# Patient Record
Sex: Female | Born: 1945
Health system: Southern US, Community
[De-identification: ages and names within clinical notes are randomized; demographics above are authoritative.]

## PROBLEM LIST (undated history)

## (undated) DIAGNOSIS — F419 Anxiety disorder, unspecified: Secondary | ICD-10-CM

## (undated) DIAGNOSIS — H9191 Unspecified hearing loss, right ear: Secondary | ICD-10-CM

## (undated) DIAGNOSIS — F32A Depression, unspecified: Secondary | ICD-10-CM

## (undated) DIAGNOSIS — I4891 Unspecified atrial fibrillation: Secondary | ICD-10-CM

## (undated) DIAGNOSIS — F329 Major depressive disorder, single episode, unspecified: Secondary | ICD-10-CM

## (undated) DIAGNOSIS — M8580 Other specified disorders of bone density and structure, unspecified site: Secondary | ICD-10-CM

## (undated) DIAGNOSIS — M1990 Unspecified osteoarthritis, unspecified site: Secondary | ICD-10-CM

## (undated) DIAGNOSIS — I10 Essential (primary) hypertension: Secondary | ICD-10-CM

## (undated) HISTORY — DX: Major depressive disorder, single episode, unspecified: F32.9

## (undated) HISTORY — PX: ABDOMINAL HYSTERECTOMY: SHX81

## (undated) HISTORY — DX: Essential (primary) hypertension: I10

## (undated) HISTORY — DX: Other specified disorders of bone density and structure, unspecified site: M85.80

## (undated) HISTORY — DX: Depression, unspecified: F32.A

## (undated) HISTORY — PX: APPENDECTOMY: SHX54

## (undated) HISTORY — DX: Unspecified atrial fibrillation: I48.91

## (undated) HISTORY — DX: Anxiety disorder, unspecified: F41.9

## (undated) HISTORY — DX: Unspecified osteoarthritis, unspecified site: M19.90

---

## 2004-10-12 ENCOUNTER — Ambulatory Visit: Payer: Self-pay | Admitting: Family Medicine

## 2004-10-13 ENCOUNTER — Ambulatory Visit: Payer: Self-pay | Admitting: Family Medicine

## 2005-11-14 ENCOUNTER — Ambulatory Visit: Payer: Self-pay | Admitting: Family Medicine

## 2006-01-09 ENCOUNTER — Ambulatory Visit: Payer: Self-pay | Admitting: Family Medicine

## 2006-12-19 ENCOUNTER — Ambulatory Visit: Payer: Self-pay | Admitting: Family Medicine

## 2006-12-19 LAB — CONVERTED CEMR LAB
ALT: 25 units/L (ref 0–40)
BUN: 14 mg/dL (ref 6–23)
Basophils Absolute: 0.1 10*3/uL (ref 0.0–0.1)
Basophils Relative: 1.1 % — ABNORMAL HIGH (ref 0.0–1.0)
Calcium: 10 mg/dL (ref 8.4–10.5)
Eosinophils Relative: 3.7 % (ref 0.0–5.0)
GFR calc Af Amer: 72 mL/min
GFR calc non Af Amer: 60 mL/min
HCT: 42.7 % (ref 36.0–46.0)
Hgb A1c MFr Bld: 5.3 % (ref 4.6–6.0)
Lymphocytes Relative: 39.2 % (ref 12.0–46.0)
MCV: 85.3 fL (ref 78.0–100.0)
Monocytes Absolute: 0.5 10*3/uL (ref 0.2–0.7)
Platelets: 194 10*3/uL (ref 150–400)
Total Bilirubin: 0.7 mg/dL (ref 0.3–1.2)
Total CHOL/HDL Ratio: 3.9
Total Protein: 6.9 g/dL (ref 6.0–8.3)

## 2006-12-25 ENCOUNTER — Ambulatory Visit: Payer: Self-pay | Admitting: Family Medicine

## 2007-10-15 ENCOUNTER — Telehealth: Payer: Self-pay | Admitting: Family Medicine

## 2007-10-28 ENCOUNTER — Emergency Department (HOSPITAL_COMMUNITY): Admission: EM | Admit: 2007-10-28 | Discharge: 2007-10-29 | Payer: Self-pay | Admitting: Emergency Medicine

## 2007-11-06 ENCOUNTER — Ambulatory Visit: Payer: Self-pay | Admitting: Family Medicine

## 2007-11-06 DIAGNOSIS — M5430 Sciatica, unspecified side: Secondary | ICD-10-CM

## 2007-11-06 DIAGNOSIS — F341 Dysthymic disorder: Secondary | ICD-10-CM | POA: Insufficient documentation

## 2007-11-14 ENCOUNTER — Ambulatory Visit: Payer: Self-pay | Admitting: Family Medicine

## 2007-11-14 DIAGNOSIS — I10 Essential (primary) hypertension: Secondary | ICD-10-CM | POA: Insufficient documentation

## 2007-11-22 ENCOUNTER — Telehealth: Payer: Self-pay | Admitting: Family Medicine

## 2008-03-18 ENCOUNTER — Telehealth: Payer: Self-pay | Admitting: Family Medicine

## 2008-11-03 ENCOUNTER — Telehealth: Payer: Self-pay | Admitting: Family Medicine

## 2008-11-04 ENCOUNTER — Ambulatory Visit: Payer: Self-pay | Admitting: Family Medicine

## 2008-11-04 DIAGNOSIS — Z87891 Personal history of nicotine dependence: Secondary | ICD-10-CM

## 2008-11-04 DIAGNOSIS — M719 Bursopathy, unspecified: Secondary | ICD-10-CM

## 2008-11-04 DIAGNOSIS — M67919 Unspecified disorder of synovium and tendon, unspecified shoulder: Secondary | ICD-10-CM | POA: Insufficient documentation

## 2009-01-05 ENCOUNTER — Ambulatory Visit: Payer: Self-pay | Admitting: Family Medicine

## 2009-01-05 ENCOUNTER — Telehealth: Payer: Self-pay | Admitting: Family Medicine

## 2009-01-05 LAB — CONVERTED CEMR LAB
Bilirubin Urine: NEGATIVE
Glucose, Urine, Semiquant: NEGATIVE
Protein, U semiquant: NEGATIVE
Urobilinogen, UA: 0.2
pH: 6.5

## 2009-01-28 ENCOUNTER — Ambulatory Visit: Payer: Self-pay | Admitting: Family Medicine

## 2009-01-28 LAB — CONVERTED CEMR LAB
Bilirubin Urine: NEGATIVE
Blood in Urine, dipstick: NEGATIVE
Glucose, Urine, Semiquant: NEGATIVE
Ketones, urine, test strip: NEGATIVE
Protein, U semiquant: NEGATIVE
Urobilinogen, UA: 0.2
pH: 6.5

## 2009-02-02 ENCOUNTER — Telehealth: Payer: Self-pay | Admitting: Family Medicine

## 2009-02-18 ENCOUNTER — Ambulatory Visit: Payer: Self-pay | Admitting: Family Medicine

## 2009-02-18 DIAGNOSIS — N390 Urinary tract infection, site not specified: Secondary | ICD-10-CM

## 2009-02-18 DIAGNOSIS — R609 Edema, unspecified: Secondary | ICD-10-CM

## 2009-02-18 LAB — CONVERTED CEMR LAB: Protein, U semiquant: NEGATIVE

## 2009-02-19 ENCOUNTER — Encounter: Payer: Self-pay | Admitting: Family Medicine

## 2009-06-08 ENCOUNTER — Telehealth (INDEPENDENT_AMBULATORY_CARE_PROVIDER_SITE_OTHER): Payer: Self-pay | Admitting: *Deleted

## 2009-06-29 ENCOUNTER — Telehealth: Payer: Self-pay | Admitting: Family Medicine

## 2009-08-31 ENCOUNTER — Telehealth: Payer: Self-pay | Admitting: Family Medicine

## 2009-09-21 ENCOUNTER — Ambulatory Visit: Payer: Self-pay | Admitting: Family Medicine

## 2009-09-21 DIAGNOSIS — J209 Acute bronchitis, unspecified: Secondary | ICD-10-CM | POA: Insufficient documentation

## 2009-09-21 DIAGNOSIS — M129 Arthropathy, unspecified: Secondary | ICD-10-CM

## 2009-10-07 ENCOUNTER — Ambulatory Visit: Payer: Self-pay | Admitting: Family Medicine

## 2009-10-07 DIAGNOSIS — IMO0002 Reserved for concepts with insufficient information to code with codable children: Secondary | ICD-10-CM

## 2009-10-07 DIAGNOSIS — M1710 Unilateral primary osteoarthritis, unspecified knee: Secondary | ICD-10-CM | POA: Insufficient documentation

## 2009-11-02 ENCOUNTER — Ambulatory Visit (HOSPITAL_BASED_OUTPATIENT_CLINIC_OR_DEPARTMENT_OTHER): Admission: RE | Admit: 2009-11-02 | Discharge: 2009-11-02 | Payer: Self-pay | Admitting: Orthopaedic Surgery

## 2009-11-10 ENCOUNTER — Telehealth: Payer: Self-pay | Admitting: Family Medicine

## 2009-11-25 ENCOUNTER — Ambulatory Visit: Payer: Self-pay | Admitting: Family Medicine

## 2009-11-25 DIAGNOSIS — I10 Essential (primary) hypertension: Secondary | ICD-10-CM

## 2009-11-25 LAB — CONVERTED CEMR LAB: Nitrite: NEGATIVE

## 2011-01-05 ENCOUNTER — Other Ambulatory Visit: Payer: Self-pay | Admitting: Family Medicine

## 2011-01-26 ENCOUNTER — Encounter: Payer: Self-pay | Admitting: Family Medicine

## 2011-01-26 ENCOUNTER — Ambulatory Visit (INDEPENDENT_AMBULATORY_CARE_PROVIDER_SITE_OTHER): Payer: Medicare Other | Admitting: Family Medicine

## 2011-01-26 DIAGNOSIS — I10 Essential (primary) hypertension: Secondary | ICD-10-CM

## 2011-01-26 DIAGNOSIS — R300 Dysuria: Secondary | ICD-10-CM

## 2011-01-26 DIAGNOSIS — F329 Major depressive disorder, single episode, unspecified: Secondary | ICD-10-CM

## 2011-01-26 DIAGNOSIS — N959 Unspecified menopausal and perimenopausal disorder: Secondary | ICD-10-CM

## 2011-01-26 DIAGNOSIS — E039 Hypothyroidism, unspecified: Secondary | ICD-10-CM

## 2011-01-26 LAB — POCT URINALYSIS DIPSTICK
Bilirubin, UA: NEGATIVE
Glucose, UA: NEGATIVE
Ketones, UA: NEGATIVE
Leukocytes, UA: NEGATIVE
Nitrite, UA: NEGATIVE
Spec Grav, UA: 1.01
Urobilinogen, UA: 0.2

## 2011-01-26 LAB — TSH: TSH: 1.04 u[IU]/mL (ref 0.35–5.50)

## 2011-01-26 MED ORDER — ALPRAZOLAM 0.5 MG PO TABS: 0.5000 mg | ORAL_TABLET | Freq: Three times a day (TID) | ORAL | Status: DC | PRN

## 2011-01-26 MED ORDER — PAROXETINE HCL 20 MG PO TABS: 20.0000 mg | ORAL_TABLET | Freq: Two times a day (BID) | ORAL | Status: DC

## 2011-01-26 MED ORDER — TRIAMTERENE-HCTZ 75-50 MG PO TABS: 1.0000 | ORAL_TABLET | Freq: Every day | ORAL | Status: DC

## 2011-01-26 NOTE — Patient Instructions (Signed)
RETURN IN 2 MONTHS FOR WELLNESS EXAM REFILLED MEDICATIONS CONTINUE REGULR MEDICATIONS

## 2011-02-06 ENCOUNTER — Encounter: Payer: Self-pay | Admitting: Family Medicine

## 2011-02-06 NOTE — Progress Notes (Signed)
  Subjective:    Patient ID: Kristina Dougherty, female    DOB: 1946-03-01, 65 y.o.   MRN: [id number] This 65 year old white divorced female is in to refill her medications and discuss her medical problems appeared she had been her much upset with anxiety and depression secondary to her losing her job and not been able to find new employment he continues to bleed the Paxil but continues to be rather anxious and wonders about another medication for which I will treat her with alprazolam oh 0.0 mg 3 times a day as needed She relates she is become a vegetarian over the last one half year and physically states he thinks he feels better. She complained of some dysuria and we will do a urinalysis She states that she did not have any menopausal symptoms following her cessation of menstruation but over the past few months and had some sweat and additional anxiety for which we will do an FSH to evaluate with menopausal syndrome. HPI    Review of Systems  Constitutional: Negative.  Negative for activity change.  Eyes: Negative.   Respiratory: Negative.   Cardiovascular: Negative.   Gastrointestinal: Negative.   Genitourinary: Negative.   Musculoskeletal: Positive for arthralgias. Negative for myalgias, back pain, joint swelling and gait problem.  Skin: Negative.   Neurological: Positive for headaches.  Hematological: Negative.   Psychiatric/Behavioral: The patient is nervous/anxious.         Objective:   Physical Exam  Constitutional: She is oriented to person, place, and time. She appears well-developed and well-nourished. No distress.  HENT:  Head: Normocephalic and atraumatic.  Right Ear: External ear normal.  Left Ear: External ear normal.  Nose: Nose normal.  Eyes: Conjunctivae and EOM are normal. Pupils are equal, round, and reactive to light. Right eye exhibits no discharge. Left eye exhibits no discharge. No scleral icterus.  Neck: Normal range of motion. Neck supple. No JVD present. No  tracheal deviation present. No thyromegaly present.  Cardiovascular: Normal rate, regular rhythm, normal heart sounds and intact distal pulses.  Exam reveals no gallop and no friction rub.   No murmur heard. Pulmonary/Chest: Effort normal and breath sounds normal. No stridor. No respiratory distress. She has no wheezes. She has no rales. She exhibits no tenderness.  Abdominal: Soft. Bowel sounds are normal. She exhibits no distension and no mass. There is no tenderness. There is no rebound and no guarding.  Musculoskeletal: Normal range of motion. She exhibits no edema and no tenderness.  Lymphadenopathy:    She has cervical adenopathy.  Neurological: She is alert and oriented to person, place, and time. She displays normal reflexes. No cranial nerve deficit. She exhibits normal muscle tone. Coordination normal.  Skin: Skin is warm and dry. She is not diaphoretic.          Assessment & Plan:   discussed her medical problems and for job and pulmonary anxiety and depression and feel that she would benefit by taking  alprazolam  0.5 mg milligrams 3 times a day,as as well as continue Paxil Continue Maxide for your blood pressure, no treatment for postmenopausal syndrome at this time but will get a TSH and FSH as well as a urinalysis

## 2011-02-28 LAB — BASIC METABOLIC PANEL
BUN: 10 mg/dL (ref 6–23)
CO2: 26 mEq/L (ref 19–32)
Calcium: 9.4 mg/dL (ref 8.4–10.5)
Chloride: 99 mEq/L (ref 96–112)
Creatinine, Ser: 0.82 mg/dL (ref 0.4–1.2)
GFR calc Af Amer: >60 mL/min (ref >60)
Potassium: 4.5 mEq/L (ref 3.5–5.1)
Sodium: 134 mEq/L — ABNORMAL LOW (ref 135–145)

## 2011-02-28 LAB — POCT HEMOGLOBIN-HEMACUE: Hemoglobin: 16.3 g/dL — ABNORMAL HIGH (ref 12.0–15.0)

## 2011-09-04 LAB — COMPREHENSIVE METABOLIC PANEL
Albumin: 3.9
CO2: 24
Calcium: 8.9
Total Protein: 6.2

## 2011-09-04 LAB — POCT CARDIAC MARKERS
CKMB, poc: 1.2
Troponin i, poc: <0.05

## 2011-09-04 LAB — URINALYSIS, ROUTINE W REFLEX MICROSCOPIC
Glucose, UA: NEGATIVE
Hgb urine dipstick: NEGATIVE
Ketones, ur: NEGATIVE
Nitrite: NEGATIVE
Protein, ur: NEGATIVE
Specific Gravity, Urine: 1.017
Urobilinogen, UA: 0.2
pH: 6.5

## 2011-09-04 LAB — I-STAT 8, (EC8 V) (CONVERTED LAB)
BUN: 12
Glucose, Bld: 93
HCT: 49 — ABNORMAL HIGH
Hemoglobin: 16.7 — ABNORMAL HIGH
Operator id: 257131
Potassium: 3.3 — ABNORMAL LOW
TCO2: 26
pCO2, Ven: 42.2 — ABNORMAL LOW

## 2011-09-04 LAB — RAPID URINE DRUG SCREEN, HOSP PERFORMED
Amphetamines: NOT DETECTED
Barbiturates: POSITIVE — AB
Benzodiazepines: POSITIVE — AB
Cocaine: NOT DETECTED

## 2011-09-04 LAB — ETHANOL: Alcohol, Ethyl (B): 112 — ABNORMAL HIGH

## 2011-09-04 LAB — CBC
Hemoglobin: 15.4 — ABNORMAL HIGH
MCV: 83.2
Platelets: 200
WBC: 10.4

## 2011-09-04 LAB — DIFFERENTIAL
Basophils Relative: 1
Eosinophils Absolute: 0.2
Eosinophils Relative: 2
Neutrophils Relative %: 64

## 2011-09-04 LAB — POCT I-STAT CREATININE: Operator id: 257131

## 2011-09-13 ENCOUNTER — Other Ambulatory Visit: Payer: Self-pay | Admitting: Family Medicine

## 2011-12-19 ENCOUNTER — Telehealth: Payer: Self-pay | Admitting: Family Medicine

## 2011-12-19 NOTE — Telephone Encounter (Signed)
Pls advise.  

## 2011-12-19 NOTE — Telephone Encounter (Signed)
Refill for one month 

## 2011-12-19 NOTE — Telephone Encounter (Signed)
Need a 30 day supply of Paxil and Alprazolam sent [facility]. She is aware to establish with another dr. Lynford Dougherty.

## 2011-12-20 MED ORDER — ALPRAZOLAM 0.5 MG PO TABS: 0.5000 mg | ORAL_TABLET | Freq: Three times a day (TID) | ORAL | Status: DC | PRN

## 2011-12-20 MED ORDER — PAROXETINE HCL 20 MG PO TABS: 20.0000 mg | ORAL_TABLET | Freq: Two times a day (BID) | ORAL | Status: DC

## 2011-12-20 NOTE — Telephone Encounter (Signed)
Rx called in to pharmacy. 

## 2012-03-01 ENCOUNTER — Encounter (HOSPITAL_COMMUNITY): Payer: Self-pay | Admitting: Family Medicine

## 2012-03-01 ENCOUNTER — Emergency Department (HOSPITAL_COMMUNITY)
Admission: EM | Admit: 2012-03-01 | Discharge: 2012-03-02 | Disposition: A | Discharge status: Home / Self Care | Payer: Medicare Other | Attending: Emergency Medicine | Admitting: Emergency Medicine

## 2012-03-01 DIAGNOSIS — F329 Major depressive disorder, single episode, unspecified: Secondary | ICD-10-CM | POA: Insufficient documentation

## 2012-03-01 DIAGNOSIS — N3289 Other specified disorders of bladder: Secondary | ICD-10-CM | POA: Insufficient documentation

## 2012-03-01 DIAGNOSIS — F3289 Other specified depressive episodes: Secondary | ICD-10-CM | POA: Insufficient documentation

## 2012-03-01 DIAGNOSIS — F411 Generalized anxiety disorder: Secondary | ICD-10-CM | POA: Insufficient documentation

## 2012-03-01 DIAGNOSIS — R300 Dysuria: Secondary | ICD-10-CM | POA: Insufficient documentation

## 2012-03-01 DIAGNOSIS — M129 Arthropathy, unspecified: Secondary | ICD-10-CM | POA: Insufficient documentation

## 2012-03-01 DIAGNOSIS — I10 Essential (primary) hypertension: Secondary | ICD-10-CM | POA: Insufficient documentation

## 2012-03-01 DIAGNOSIS — N390 Urinary tract infection, site not specified: Secondary | ICD-10-CM | POA: Insufficient documentation

## 2012-03-01 LAB — URINALYSIS, ROUTINE W REFLEX MICROSCOPIC
Glucose, UA: NEGATIVE mg/dL
Nitrite: NEGATIVE
Protein, ur: NEGATIVE mg/dL
Specific Gravity, Urine: 1.007 (ref 1.005–1.030)
Urobilinogen, UA: 0.2 mg/dL (ref 0.0–1.0)

## 2012-03-01 NOTE — ED Notes (Signed)
Pt c/o "bladder spasms" as well as burning with urination. Pt states the urinary burning has been going on for a few weeks and she was previously tx with abx but the relief was only temporary. Pt states the bladder spasms started about a week ago. Pt was seen by urologist this week and her urine specimen came back negative. Pt states the burning with urination is not as bad as before but the bladder spasms are constant.

## 2012-03-01 NOTE — ED Notes (Signed)
Pt reports having bladder problems starting in February. States she has been given abx multiple times for uti's, but all urine cultures come back negative. States she has been having burning and pressure with urination and bladder spasms. NAD noted at this time.

## 2012-03-02 MED ORDER — CEFPODOXIME PROXETIL 200 MG PO TABS: 200.0000 mg | ORAL_TABLET | Freq: Two times a day (BID) | ORAL | Status: AC

## 2012-03-02 MED ORDER — HYDROCODONE-ACETAMINOPHEN 5-500 MG PO TABS: 1.0000 | ORAL_TABLET | Freq: Four times a day (QID) | ORAL | Status: DC | PRN

## 2012-03-02 NOTE — ED Provider Notes (Signed)
History     CSN: [id number]  Arrival date & time 03/01/12  1655   First MD Initiated Contact with Patient 03/01/12 2300      Chief Complaint  Patient presents with  . Bladder spasms     pressure and burning with urination    (Consider location/radiation/quality/duration/timing/severity/associated sxs/prior treatment) HPI Comments: Patient has seen urology and was last seen 3 days ago and at that time had a normal urine and urine culture. However patient's symptoms since that time have worsened with bladder spasm, dysuria and ongoing pain despite taking bladder spasm medication.  Patient is a 66 y.o. female presenting with dysuria. The history is provided by the patient.  Dysuria  This is a recurrent problem. Episode onset: 3 weeks ago. The problem occurs every urination. The problem has been gradually worsening. The quality of the pain is described as burning, stabbing, shooting and aching (pressure in the bladder). The pain is at a severity of 8/10. The pain is severe. There has been no fever. She is not sexually active. Associated symptoms include frequency and urgency. Pertinent negatives include no nausea, no vomiting, no discharge, no hematuria, no possible pregnancy and no flank pain. She has tried antibiotics ( has been on 3 different antibiotics the last 3 weeks. States she has taken Cipro, Macrobid, one other medication that she finished 3 days ago. She states that her symptoms improved for several days when she starts the antibiotic and then they return.) for the symptoms. Her past medical history is significant for recurrent UTIs. Her past medical history does not include kidney stones.    Past Medical History  Diagnosis Date  . Anxiety   . Depression   . Hypertension   . Arthritis     Past Surgical History  Procedure Date  . Abdominal hysterectomy     History reviewed. No pertinent family history.  History  Substance Use Topics  . Smoking status: Former Smoker --  0.3 packs/day    Types: Cigarettes    Quit date: 08/28/2011  . Smokeless tobacco: Never Used  . Alcohol Use: No    OB History    Grav Para Term Preterm Abortions TAB SAB Ect Mult Living                  Review of Systems  Gastrointestinal: Negative for nausea and vomiting.  Genitourinary: Positive for dysuria, urgency and frequency. Negative for hematuria and flank pain.  All other systems reviewed and are negative.    Allergies  Nitrofurantoin  Home Medications   Current Outpatient Rx  Name Route Sig Dispense Refill  . ALPRAZOLAM 0.5 MG PO TABS Oral Take 1 tablet (0.5 mg total) by mouth 3 (three) times daily as needed. 90 tablet 0  . ASPIRIN 81 MG PO TABS Oral Take 81 mg by mouth daily.      . ADULT MULTIVITAMIN W/MINERALS CH Oral Take 1 tablet by mouth daily.    Marland Kitchen PAROXETINE HCL 20 MG PO TABS Oral Take 1 tablet (20 mg total) by mouth 2 (two) times daily. 60 tablet 0    Pt needs to schedule a follow up appt before next  ...  . TRIAMTERENE-HCTZ 75-50 MG PO TABS Oral Take 1 tablet by mouth daily. 30 tablet 11    BP 134/98  Pulse 99  Temp(Src) 99.6 F (37.6 C) (Oral)  Resp 18  SpO2 93%  Physical Exam  Nursing note and vitals reviewed. Constitutional: She is oriented to person, place, and time. She  appears well-developed and well-nourished. No distress.  HENT:  Head: Normocephalic and atraumatic.  Eyes: EOM are normal. Pupils are equal, round, and reactive to light.  Cardiovascular: Normal rate, regular rhythm, normal heart sounds and intact distal pulses.  Exam reveals no friction rub.   No murmur heard. Pulmonary/Chest: Effort normal and breath sounds normal. She has no wheezes. She has no rales.  Abdominal: Soft. Bowel sounds are normal. She exhibits no distension. There is tenderness in the suprapubic area. There is no rebound, no guarding and no CVA tenderness.       Mild suprapubic tenderness  Genitourinary:       No evidence of yeast. Did not visualize any  bladder prolapse.  Musculoskeletal: Normal range of motion. She exhibits no tenderness.       No edema  Neurological: She is alert and oriented to person, place, and time. No cranial nerve deficit.  Skin: Skin is warm and dry. No rash noted.  Psychiatric: She has a normal mood and affect. Her behavior is normal.    ED Course  Procedures (including critical care time)  Labs Reviewed  URINALYSIS, ROUTINE W REFLEX MICROSCOPIC - Abnormal; Notable for the following:    Leukocytes, UA SMALL (*)    All other components within normal limits  URINE MICROSCOPIC-ADD ON - Abnormal; Notable for the following:    Bacteria, UA FEW (*)    All other components within normal limits  URINE CULTURE   No results found.   1. UTI (lower urinary tract infection)       MDM  Patient with a history of recurrent UTIs her last episode was 6 months ago where she states she required multiple rounds of antibiotics to alleviate symptoms. The last 3 weeks she's had worsening bladder spasm and dysuria. She's completed 3 courses of antibiotics which every time she starts the antibiotic her symptoms improved and then returned. She's seen Dr. Wynelle Link with urology and her last UA and urine culture was done 4 days ago and showed no signs of infection however since she's been off the antibiotics for the last 3 days her symptoms have only worsened. She denies any symptoms suggestive of pyelonephritis.  UA today shows 3-6 white blood cells few bacteria small leukocytes. Concern for new infection and urine culture sent. Patient started on Vantin due to the multiple recent antibiotics and will have followup with urology on Monday.       Gwyneth Sprout, MD 03/03/12 [phone]

## 2012-03-02 NOTE — Discharge Instructions (Signed)
Urinary Tract Infection A urinary tract infection (UTI) is often caused by a germ (bacteria). A UTI is usually helped with medicine (antibiotics) that kills germs. Take all the medicine until it is gone. Do this even if you are feeling better. You are usually better in 7 to 10 days. HOME CARE   Drink enough water and fluids to keep your pee (urine) clear or pale yellow. Drink:   Cranberry juice.   Water.   Avoid:   Caffeine.   Tea.   Bubbly (carbonated) drinks.   Alcohol.   Only take medicine as told by your doctor.   To prevent further infections:   Pee often.   After pooping (bowel movement), women should wipe from front to back. Use each tissue only once.   Pee before and after having sex (intercourse).  Ask your doctor when your test results will be ready. Make sure you follow up and get your test results.  GET HELP RIGHT AWAY IF:   There is very bad back pain or lower belly (abdominal) pain.   You get the chills.   You have a fever.   Your baby is older than 3 months with a rectal temperature of 102 F (38.9 C) or higher.   Your baby is 40 months old or younger with a rectal temperature of 100.4 F (38 C) or higher.   You feel sick to your stomach (nauseous) or throw up (vomit).   There is continued burning with peeing.   Your problems are not better in 3 days. Return sooner if you are getting worse.  MAKE SURE YOU:   Understand these instructions.   Will watch your condition.   Will get help right away if you are not doing well or get worse.  Document Released: 05/01/2008 Document Revised: 11/02/2011 Document Reviewed: 05/01/2008 [facility] Patient Information 2012 [place], [place].Urine Culture Collection, Female  You will collect a sample of pee (urine) in a cup. Read the instructions below before beginning. If you have any questions, ask the nurse before you begin. Follow the instructions carefully. 1. Wash your hands with soap and water and dry them  thoroughly.  2. Open the lid of the cup. Be careful not to touch the inside.  3. Clean the private (genital) area. 1. Sit over the toilet. Use the fingers of one hand to separate and hold open the folds of the skin in your private area.  2. Clean the pee (urinary) opening and surrounding area with the gauze, wiping from front to back. Throw away the gauze in the trash, not the toilet.  3. Repeat step "b"2 more times.  4. With the folds of skin still separated, pee a small amount into toilet. STOP, then pee into the cup. Fill the cup half way.  5. Put the lid on the cup tightly.  6. Wash your hands with soap and water.  7. If you were given a label, put the label on the cup.  8. Give the cup to the nurse.  Document Released: 10/26/2008 Document Revised: 11/02/2011 Document Reviewed: 10/26/2008 [facility] Patient Information 2012 [place], [place].

## 2012-03-03 LAB — URINE CULTURE
Colony Count: NO GROWTH
Culture: NO GROWTH
Special Requests: NORMAL

## 2012-03-18 ENCOUNTER — Ambulatory Visit: Payer: Medicare Other | Admitting: Family Medicine

## 2012-03-18 DIAGNOSIS — Z0289 Encounter for other administrative examinations: Secondary | ICD-10-CM

## 2012-03-19 ENCOUNTER — Encounter (HOSPITAL_COMMUNITY): Payer: Self-pay | Admitting: Emergency Medicine

## 2012-03-19 ENCOUNTER — Emergency Department (HOSPITAL_COMMUNITY)
Admission: EM | Admit: 2012-03-19 | Discharge: 2012-03-20 | Disposition: A | Discharge status: Home / Self Care | Payer: Medicare Other | Attending: Emergency Medicine | Admitting: Emergency Medicine

## 2012-03-19 DIAGNOSIS — R10819 Abdominal tenderness, unspecified site: Secondary | ICD-10-CM | POA: Insufficient documentation

## 2012-03-19 DIAGNOSIS — M129 Arthropathy, unspecified: Secondary | ICD-10-CM | POA: Insufficient documentation

## 2012-03-19 DIAGNOSIS — F341 Dysthymic disorder: Secondary | ICD-10-CM | POA: Insufficient documentation

## 2012-03-19 DIAGNOSIS — R109 Unspecified abdominal pain: Secondary | ICD-10-CM | POA: Insufficient documentation

## 2012-03-19 DIAGNOSIS — I10 Essential (primary) hypertension: Secondary | ICD-10-CM | POA: Insufficient documentation

## 2012-03-19 DIAGNOSIS — K529 Noninfective gastroenteritis and colitis, unspecified: Secondary | ICD-10-CM | POA: Insufficient documentation

## 2012-03-19 DIAGNOSIS — R110 Nausea: Secondary | ICD-10-CM | POA: Insufficient documentation

## 2012-03-19 DIAGNOSIS — R339 Retention of urine, unspecified: Secondary | ICD-10-CM | POA: Insufficient documentation

## 2012-03-19 DIAGNOSIS — R197 Diarrhea, unspecified: Secondary | ICD-10-CM | POA: Insufficient documentation

## 2012-03-19 DIAGNOSIS — R630 Anorexia: Secondary | ICD-10-CM | POA: Insufficient documentation

## 2012-03-19 DIAGNOSIS — Z79899 Other long term (current) drug therapy: Secondary | ICD-10-CM | POA: Insufficient documentation

## 2012-03-19 LAB — URINALYSIS, ROUTINE W REFLEX MICROSCOPIC
Nitrite: POSITIVE — AB
Specific Gravity, Urine: 1.006 (ref 1.005–1.030)
Urobilinogen, UA: 1 mg/dL (ref 0.0–1.0)
pH: 6.5 (ref 5.0–8.0)

## 2012-03-19 LAB — BASIC METABOLIC PANEL
Chloride: 101 mEq/L (ref 96–112)
GFR calc Af Amer: >90 mL/min (ref >90)
GFR calc non Af Amer: 87 mL/min — ABNORMAL LOW (ref >90)
Potassium: 3.4 mEq/L — ABNORMAL LOW (ref 3.5–5.1)
Sodium: 140 mEq/L (ref 135–145)

## 2012-03-19 LAB — POCT I-STAT, CHEM 8
BUN: <3 mg/dL — ABNORMAL LOW (ref 6–23)
Calcium, Ion: 1.14 mmol/L (ref 1.12–1.32)
Chloride: 104 mEq/L (ref 96–112)
Creatinine, Ser: 0.8 mg/dL (ref 0.50–1.10)
Glucose, Bld: 111 mg/dL — ABNORMAL HIGH (ref 70–99)
HCT: 38 % (ref 36.0–46.0)
Hemoglobin: 12.9 g/dL (ref 12.0–15.0)
Potassium: 3.5 mEq/L (ref 3.5–5.1)
Sodium: 139 mEq/L (ref 135–145)
TCO2: 28 mmol/L (ref 0–100)

## 2012-03-19 LAB — DIFFERENTIAL
Basophils Absolute: 0.1 10*3/uL (ref 0.0–0.1)
Basophils Relative: 0 % (ref 0–1)
Eosinophils Absolute: 0.1 10*3/uL (ref 0.0–0.7)
Monocytes Relative: 6 % (ref 3–12)
Neutro Abs: 16.9 10*3/uL — ABNORMAL HIGH (ref 1.7–7.7)
Neutrophils Relative %: 82 % — ABNORMAL HIGH (ref 43–77)

## 2012-03-19 LAB — URINE MICROSCOPIC-ADD ON

## 2012-03-19 LAB — CBC
Hemoglobin: 12.9 g/dL (ref 12.0–15.0)
MCHC: 35 g/dL (ref 30.0–36.0)
Platelets: 404 10*3/uL — ABNORMAL HIGH (ref 150–400)
RDW: 13 % (ref 11.5–15.5)

## 2012-03-19 MED ORDER — METRONIDAZOLE IN NACL 5-0.79 MG/ML-% IV SOLN
500.0000 mg | Freq: Four times a day (QID) | INTRAVENOUS | Status: DC
  Administered 2012-03-20: 500 mg via INTRAVENOUS
  Filled 2012-03-19: qty 100

## 2012-03-19 MED ORDER — SODIUM CHLORIDE 0.9 % IV BOLUS (SEPSIS)
1000.0000 mL | Freq: Once | INTRAVENOUS | Status: AC
  Administered 2012-03-19: 1000 mL via INTRAVENOUS

## 2012-03-19 MED ORDER — SODIUM CHLORIDE 0.9 % IV BOLUS (SEPSIS)
1000.0000 mL | Freq: Once | INTRAVENOUS | Status: AC
  Administered 2012-03-20: 1000 mL via INTRAVENOUS

## 2012-03-19 NOTE — ED Provider Notes (Signed)
History     CSN: [id number]  Arrival date & time 03/19/12  2138   First MD Initiated Contact with Patient 03/19/12 2230      Chief Complaint  Patient presents with  . Diarrhea  . Urinary Retention    (Consider location/radiation/quality/duration/timing/severity/associated sxs/prior treatment) HPI  Patient with history of recurrent urinary tract infections as well as multiple recent antibiotic treatment courses over last month for recurrent urinary tract infections presents to emergency department complaining of 2-3 week history of being "sick." Patient states that after she was seen in the emergency department on April 5 and diagnosed with a recurrent urinary tract infection she was put on antibiotics and completed those. Patient states that within a few days of completing the antibiotics she had acute onset diarrhea, nausea, and intermittent abdominal cramping. Patient states that since onset she's had persistent daily foul-smelling watery diarrhea with abdominal cramping and nausea. Patient denies vomiting but states severe nausea and complete loss of appetite. Patient has felt chilled and had a fever of 102 last week. She has ongoing shaking and chills. Patient states that over the last 24-hour she had recurrence of mild dysuria and bladder "pressure." Patient has seen Dr. Wynelle Link as well as physician assistant in the last 3 weeks. Patient states that during the month of April she's had a negative CT scan and ultrasound of her bladder in the evaluation or recurrent UTIs. Patient states she was seen at an urgent care in town last week for her current ongoing symptoms and gave them stool however was told that the stool culture would not be ready for approximately 7 days. Patient states she has not heard back from the stool culture.  Past Medical History  Diagnosis Date  . Anxiety   . Depression   . Hypertension   . Arthritis     Past Surgical History  Procedure Date  . Abdominal  hysterectomy     History reviewed. No pertinent family history.  History  Substance Use Topics  . Smoking status: Former Smoker -- 0.3 packs/day    Types: Cigarettes    Quit date: 08/28/2011  . Smokeless tobacco: Never Used  . Alcohol Use: No    OB History    Grav Para Term Preterm Abortions TAB SAB Ect Mult Living                  Review of Systems  All other systems reviewed and are negative.    Allergies  Nitrofurantoin  Home Medications   Current Outpatient Rx  Name Route Sig Dispense Refill  . ALPRAZOLAM 0.5 MG PO TABS Oral Take 0.5 mg by mouth 3 (three) times daily as needed. anxiety    . ASPIRIN 81 MG PO TABS Oral Take 81 mg by mouth daily.      . ADULT MULTIVITAMIN W/MINERALS CH Oral Take 1 tablet by mouth daily.    Marland Kitchen PAROXETINE HCL 20 MG PO TABS Oral Take 20 mg by mouth 2 (two) times daily.    . TRIAMTERENE-HCTZ 75-50 MG PO TABS Oral Take 1 tablet by mouth daily.      BP 143/72  Pulse 89  Temp(Src) 99.2 F (37.3 C) (Oral)  Resp 20  SpO2 96%  Physical Exam  Nursing note and vitals reviewed. Constitutional: She is oriented to person, place, and time. She appears well-developed and well-nourished. No distress.  HENT:  Head: Normocephalic and atraumatic.  Eyes: Conjunctivae are normal.  Neck: Normal range of motion. Neck supple.  Cardiovascular: Normal  rate, regular rhythm, normal heart sounds and intact distal pulses.  Exam reveals no gallop and no friction rub.   No murmur heard. Pulmonary/Chest: Effort normal and breath sounds normal. No respiratory distress. She has no wheezes. She has no rales. She exhibits no tenderness.  Abdominal: Soft. Bowel sounds are normal. She exhibits no distension and no mass. There is tenderness. There is no rebound and no guarding.       Mild tenderness to palpation suprapubic region without rebound or guarding. No personal signs.  Musculoskeletal: Normal range of motion. She exhibits no edema and no tenderness.    Neurological: She is alert and oriented to person, place, and time.  Skin: Skin is warm and dry. No rash noted. She is not diaphoretic. No erythema.  Psychiatric: She has a normal mood and affect.    ED Course  Procedures (including critical care time)  Bolus IV fluids. IV Zofran and morphine. IV metronidazole.  Patient attempting to get stool sample for culture and PCR of C. Difficile.  Assessment and plan discussed with Dr. Hyacinth Meeker.  Labs Reviewed  CBC - Abnormal; Notable for the following:    WBC 20.5 (*)    Platelets 404 (*)    All other components within normal limits  DIFFERENTIAL - Abnormal; Notable for the following:    Neutrophils Relative 82 (*)    Neutro Abs 16.9 (*)    Lymphocytes Relative 11 (*)    Monocytes Absolute 1.3 (*)    All other components within normal limits  URINALYSIS, ROUTINE W REFLEX MICROSCOPIC - Abnormal; Notable for the following:    Color, Urine ORANGE (*) BIOCHEMICALS MAY BE AFFECTED BY COLOR   APPearance CLOUDY (*)    Nitrite POSITIVE (*)    Leukocytes, UA LARGE (*)    All other components within normal limits  URINE MICROSCOPIC-ADD ON - Abnormal; Notable for the following:    Squamous Epithelial / LPF MANY (*)    Bacteria, UA FEW (*)    All other components within normal limits  POCT I-STAT, CHEM 8 - Abnormal; Notable for the following:    BUN <3 (*)    Glucose, Bld 111 (*)    All other components within normal limits  BASIC METABOLIC PANEL   No results found.   No diagnosis found.    MDM  Pending stool PCR for C. difficile. Willl continue to hydrate patient with IV fluids. Dispo pending reevaluation of the patient and pending labs. Sign out given to Dr. Hyacinth Meeker who will continue to follow patient.        [place], [place] 03/20/12 [phone]

## 2012-03-19 NOTE — ED Notes (Addendum)
Patient states that she has been "sick" for ten days (abdominal cramping, nausea, fever, diarrhea, urinary retention, body aches, and chills).  Patient was seen at Urgent Care on Saturday; patient had stool culture done.  Patient was told that it would take up to seven days for results to come back.  Patient shaking in triage; reports green bowel movements.  Patient reports burning during urination; denies blood in urine.  Patient states that she has been on antibiotics for the past two weeks.

## 2012-03-19 NOTE — ED Notes (Signed)
Received pt. From triage, NAD  Noted, respirations even and regular,

## 2012-03-20 LAB — URINALYSIS, ROUTINE W REFLEX MICROSCOPIC
Glucose, UA: NEGATIVE mg/dL
Hgb urine dipstick: NEGATIVE
Ketones, ur: 15 mg/dL — AB
Protein, ur: NEGATIVE mg/dL

## 2012-03-20 MED ORDER — ONDANSETRON 4 MG PO TBDP: 4.0000 mg | ORAL_TABLET | Freq: Three times a day (TID) | ORAL | Status: AC | PRN

## 2012-03-20 MED ORDER — MORPHINE SULFATE 4 MG/ML IJ SOLN
4.0000 mg | Freq: Once | INTRAMUSCULAR | Status: AC
  Administered 2012-03-20: 4 mg via INTRAVENOUS
  Filled 2012-03-20: qty 1

## 2012-03-20 MED ORDER — METRONIDAZOLE 500 MG PO TABS: 500.0000 mg | ORAL_TABLET | Freq: Three times a day (TID) | ORAL | Status: DC

## 2012-03-20 MED ORDER — OXYCODONE-ACETAMINOPHEN 5-325 MG PO TABS: 1.0000 | ORAL_TABLET | ORAL | Status: DC | PRN

## 2012-03-20 MED ORDER — ONDANSETRON HCL 4 MG/2ML IJ SOLN
4.0000 mg | Freq: Once | INTRAMUSCULAR | Status: AC
  Administered 2012-03-20: 4 mg via INTRAVENOUS
  Filled 2012-03-20: qty 2

## 2012-03-20 NOTE — ED Notes (Signed)
66 y/o female with recurrent UTI's and multiple courses of abx over the recent past who presents with watery diarrhea and abd cramping which is constant.  Has no vomiting but persistent nausea.    PE:  Has mildly tender lower abd RLQ and LLQ and SP - has no upper abd ttp and increased BS.  Lungs and heart clear and regular, OP moist.  Assessment:  Has likely C diff colitis - needs flagyl, stool sample obtained, UA by cath as initial sample appeared contaminated.  Hydrate, antimemetics, pain meds, reeval.  Medical screening examination/treatment/procedure(s) were conducted as a shared visit with non-physician practitioner(s) and myself.  I personally evaluated the patient during the encounter   Vida Roller, MD 03/20/12 [phone]

## 2012-03-20 NOTE — ED Provider Notes (Signed)
Patient seen and examined with physician Asst. Hunt.  Patient is in improved symptoms after medications, has been able to tolerate fluids by mouth, pain is improved, blood counts were noted to be elevated but given her diarrhea illness after multiple doses of antibiotics this is likely to be C. differential colitis. This has been tested in the emergency department, the sample has been sent, the patient has been getting medications for comfort and feels much better. She has tolerated fluids, ambulated to the emergency department and appears stable for discharge. She is requesting discharge at this time. I have given her the opportunity to stay in the hospital as an admitted patient but she has declined stating that she'll return if her symptoms worsen.  Medical screening examination/treatment/procedure(s) were conducted as a shared visit with non-physician practitioner(s) and myself.  I personally evaluated the patient during the encounter  Please see my separate respective documentation pertaining to this patient encounter   Vida Roller, MD 03/20/12 [phone]

## 2012-03-20 NOTE — Discharge Instructions (Signed)
Please see the attached reading instructions regarding your likely diagnosis. The testing for the infectious diarrhea will not be back for another 24-48 hours. Please have your family doctor contact the hospital to get these results. Until that time please take the medication called Flagyl 3 times a day as prescribed. Percocet severe pain, Zofran for nausea. Drink plenty of fluids. Monitor your urine to make sure that it stays clear. That's how you know if you're staining hydrated.  Return to the hospital immediately for severe or worsening symptoms including vomiting, fever, severe or worsening pain.

## 2012-03-20 NOTE — ED Notes (Signed)
Pt. Discharged to home, pt. Alert and oriented, ambulatory gait steady, NAD noted 

## 2012-03-21 LAB — URINE CULTURE
Colony Count: NO GROWTH
Culture  Setup Time: 201304240817
Culture: NO GROWTH

## 2012-03-22 ENCOUNTER — Encounter (HOSPITAL_COMMUNITY): Payer: Self-pay | Admitting: *Deleted

## 2012-03-22 ENCOUNTER — Emergency Department (HOSPITAL_COMMUNITY)
Admission: EM | Admit: 2012-03-22 | Discharge: 2012-03-22 | Disposition: A | Discharge status: Home / Self Care | Payer: Medicare Other | Attending: Emergency Medicine | Admitting: Emergency Medicine

## 2012-03-22 DIAGNOSIS — F411 Generalized anxiety disorder: Secondary | ICD-10-CM | POA: Insufficient documentation

## 2012-03-22 DIAGNOSIS — R300 Dysuria: Secondary | ICD-10-CM | POA: Insufficient documentation

## 2012-03-22 DIAGNOSIS — I10 Essential (primary) hypertension: Secondary | ICD-10-CM | POA: Insufficient documentation

## 2012-03-22 DIAGNOSIS — N3289 Other specified disorders of bladder: Secondary | ICD-10-CM | POA: Insufficient documentation

## 2012-03-22 LAB — URINALYSIS, ROUTINE W REFLEX MICROSCOPIC
Bilirubin Urine: NEGATIVE
Hgb urine dipstick: NEGATIVE
Ketones, ur: NEGATIVE mg/dL
Protein, ur: NEGATIVE mg/dL
Urobilinogen, UA: 0.2 mg/dL (ref 0.0–1.0)

## 2012-03-22 MED ORDER — FLAVOXATE HCL 100 MG PO TABS: 100.0000 mg | ORAL_TABLET | Freq: Three times a day (TID) | ORAL | Status: DC | PRN

## 2012-03-22 MED ORDER — LORAZEPAM 1 MG PO TABS: 1.0000 mg | ORAL_TABLET | Freq: Three times a day (TID) | ORAL | Status: AC | PRN

## 2012-03-22 MED ORDER — LORAZEPAM 1 MG PO TABS
1.0000 mg | ORAL_TABLET | Freq: Once | ORAL | Status: AC
  Administered 2012-03-22: 1 mg via ORAL
  Filled 2012-03-22: qty 1

## 2012-03-22 NOTE — ED Notes (Signed)
Pt has had urinary track problems since January.  She has been on multiple antibiotics and been unwell since.  Pt states that she can not get into [facility] to be seen and she continues to have intermittent burning and bladder spasms.  Pt appears quite frustrated by all of this.

## 2012-03-22 NOTE — ED Provider Notes (Signed)
History     CSN: [id number]  Arrival date & time 03/22/12  1405   None     Chief Complaint  Patient presents with  . Urinary Tract Infection    (Consider location/radiation/quality/duration/timing/severity/associated sxs/prior treatment) HPI Comments: Kristina Dougherty is a 66 y.o. Female who complains of dysuria and bladder spasm at the end of voiding for 2 days. This is due to recurrent problem for several weeks. She is completing a treatment course of Flagyl for suspected C. difficile diarrhea. C. difficile test done on 03/19/12 was positive. This is her third visit to the ED this month. She is having problems getting back to see her urologist. Geronimo Running been treated with Valium vaginal suppository for bladder spasm. She has no fever or chills, nausea, or diarrhea at this time. She denies abdominal pain. He states that her "nerves are bad.".  Patient is a 66 y.o. female presenting with urinary tract infection. The history is provided by the patient.  Urinary Tract Infection    Past Medical History  Diagnosis Date  . Anxiety   . Depression   . Hypertension   . Arthritis     Past Surgical History  Procedure Date  . Abdominal hysterectomy     No family history on file.  History  Substance Use Topics  . Smoking status: Former Smoker -- 0.3 packs/day    Types: Cigarettes    Quit date: 08/28/2011  . Smokeless tobacco: Never Used  . Alcohol Use: No    OB History    Grav Para Term Preterm Abortions TAB SAB Ect Mult Living                  Review of Systems  All other systems reviewed and are negative.    Allergies  Morphine and related and Nitrofurantoin  Home Medications   Current Outpatient Rx  Name Route Sig Dispense Refill  . ALPRAZOLAM 0.5 MG PO TABS Oral Take 0.5 mg by mouth 3 (three) times daily as needed. anxiety    . ASPIRIN 81 MG PO TABS Oral Take 81 mg by mouth daily.      . CEFPODOXIME PROXETIL 200 MG PO TABS Oral Take 200 mg by mouth 2 (two) times  daily.    Marland Kitchen CIPROFLOXACIN HCL 250 MG PO TABS Oral Take 250 mg by mouth 2 (two) times daily.    Marland Kitchen DIAZEPAM 10 MG RE GEL Rectal Place 10 mg rectally 2 (two) times daily as needed. For anxiety    . METRONIDAZOLE 500 MG PO TABS Oral Take 500 mg by mouth 3 (three) times daily.    . ADULT MULTIVITAMIN W/MINERALS CH Oral Take 1 tablet by mouth daily.    Marland Kitchen ONDANSETRON 4 MG PO TBDP Oral Take 1 tablet (4 mg total) by mouth every 8 (eight) hours as needed for nausea. 10 tablet 0  . OXYBUTYNIN CHLORIDE 5 MG PO TABS Oral Take 5 mg by mouth 3 (three) times daily.    Marland Kitchen PAROXETINE HCL 20 MG PO TABS Oral Take 20 mg by mouth 2 (two) times daily.    Marland Kitchen PHENAZOPYRIDINE HCL 200 MG PO TABS Oral Take 200 mg by mouth 3 (three) times daily as needed. For bladder irritation    . SULFAMETHOXAZOLE-TRIMETHOPRIM 800-160 MG PO TABS Oral Take 1 tablet by mouth 2 (two) times daily.    Marland Kitchen FLAVOXATE HCL 100 MG PO TABS Oral Take 1 tablet (100 mg total) by mouth 3 (three) times daily as needed (for bladder spasm). 30  tablet 0  . TRIAMTERENE-HCTZ 75-50 MG PO TABS Oral Take 1 tablet by mouth daily.      BP 169/99  Pulse 94  Temp(Src) 97.9 F (36.6 C) (Oral)  Resp 16  SpO2 97%  Physical Exam  Nursing note and vitals reviewed. Constitutional: She is oriented to person, place, and time. She appears well-developed and well-nourished.  HENT:  Head: Normocephalic and atraumatic.  Eyes: Conjunctivae and EOM are normal. Pupils are equal, round, and reactive to light.  Neck: Normal range of motion and phonation normal. Neck supple.  Cardiovascular: Normal rate.   Pulmonary/Chest: Effort normal. She exhibits no tenderness.  Musculoskeletal: Normal range of motion.  Neurological: She is alert and oriented to person, place, and time. She has normal strength. She exhibits normal muscle tone.  Skin: Skin is warm and dry.  Psychiatric: Her behavior is normal. Judgment and thought content normal.       anxious    ED Course  Procedures  (including critical care time)   Labs Reviewed  URINALYSIS, ROUTINE W REFLEX MICROSCOPIC   No results found.   1. Spasm of bladder       MDM  Patient with bladder symptoms, and negative urinalysis. Doubt infection, or hematuria. Her diarrhea has improved, and her recent diagnosis of C. difficile enteral colitis, has apparently resolved. While using the Flagyl. She is stable for discharge without aggressive evaluation.  Plan: Home Medications- trial Tyler Aas; Home Treatments- oral fluids; Recommended follow up- Urology 1 week        Flint Melter, MD 03/22/12 1850

## 2012-03-22 NOTE — ED Notes (Signed)
Patient states feels less nervous family friend at bedside.

## 2012-03-22 NOTE — ED Notes (Signed)
Patient family friend and patient asked for medication to calm her nerves. Patient has slight to moderate tremors extremities intermittent.  Patient laying supine for position of comfort due to chronic back pain.

## 2012-03-22 NOTE — Discharge Instructions (Signed)
Get plenty of rest, drink a lot of fluids, and try the new medicine for spasm.

## 2012-03-23 LAB — STOOL CULTURE: Special Requests: NORMAL

## 2012-04-23 ENCOUNTER — Ambulatory Visit (INDEPENDENT_AMBULATORY_CARE_PROVIDER_SITE_OTHER): Payer: Medicare Other | Admitting: Gynecology

## 2012-04-23 ENCOUNTER — Encounter: Payer: Self-pay | Admitting: Gynecology

## 2012-04-23 VITALS — BP 144/92 | Ht 61.5 in | Wt 148.0 lb

## 2012-04-23 DIAGNOSIS — L293 Anogenital pruritus, unspecified: Secondary | ICD-10-CM

## 2012-04-23 DIAGNOSIS — L940 Localized scleroderma [morphea]: Secondary | ICD-10-CM

## 2012-04-23 DIAGNOSIS — L292 Pruritus vulvae: Secondary | ICD-10-CM

## 2012-04-23 DIAGNOSIS — N9089 Other specified noninflammatory disorders of vulva and perineum: Secondary | ICD-10-CM

## 2012-04-23 DIAGNOSIS — L900 Lichen sclerosus et atrophicus: Secondary | ICD-10-CM | POA: Insufficient documentation

## 2012-04-23 LAB — WET PREP FOR TRICH, YEAST, CLUE
Clue Cells Wet Prep HPF POC: NONE SEEN
Yeast Wet Prep HPF POC: NONE SEEN

## 2012-04-23 MED ORDER — OXYCODONE-ACETAMINOPHEN 5-500 MG PO CAPS: 1.0000 | ORAL_CAPSULE | ORAL | Status: AC | PRN

## 2012-04-23 MED ORDER — CLOBETASOL PROPIONATE 0.05 % EX CREA: TOPICAL_CREAM | Freq: Two times a day (BID) | CUTANEOUS | Status: AC

## 2012-04-23 NOTE — Progress Notes (Signed)
Patient ID: Kristina Dougherty, female   DOB: 06/08/1946, 66 y.o.   MRN: [id number] The patient is a 66 year old new patient to the practice who is been complaining of vaginal irritation and periclitoral pruritus dryness and discomfort. Patient has recently been treated multiple times for suspected urinary tract infection different antibiotics and several visits to the emergency room and developed C. difficile and recently completed her treatment Flagyl one week ago. Patient did her last mammogram was in 1996 she does not wish to have anymore done. Her colonoscopy was reported to be normal in 2003. She's had no prior bone density study. Patient state that she's had a Pap smear less than 2 years ago that she's had normal Pap smears in the past.  Exam: Physical Exam  Genitourinary:     It  appears that the patient's thick white flat lesion is lichen sclerosis. The area near the clitoral hood was biopsied with a keypunch biopsy instrument after 1% lidocaine was infiltrated at its base. Silver nitrate was used for hemostasis. She will be started on clobetasol propionate 0.05% to apply twice a day for 2 weeks. She will be instructed to return to the office in 2 weeks for followup. Patient is a smoker and was counseled. We'll schedule a bone density study here in the office as well. Her wet prep demonstrated few white blood cells and some bacteria.

## 2012-04-23 NOTE — Patient Instructions (Signed)
Lichen Sclerosus   Lichen sclerosus is a skin problem. It can happen on any part of the body, but it commonly involves the anal or genital areas. Lichen sclerosus is not an infection or a fungus. Girls and women are more commonly affected than boys and men. CAUSES The cause is not known. It could be the result of an overactive immune system or a lack of certain hormones. Lichen sclerosus is not passed from one person to another (not contagious). SYMPTOMS Your skin may have:  Thin, wrinkled, white areas.   Thickened white areas.   Red and swollen patches.   Tears or cracks.   Bruising.   Blood blisters.   Severe itching.  You may also have pain, itching, or burning with urination. Constipation is also common in people with lichen sclerosus. DIAGNOSIS Your caregiver will do a physical exam. Sometimes, a tissue sample (biopsy) may be sent for testing. TREATMENT Treatment may involve putting a thin layer of medicated cream (topical steroid) over the areas with lichen sclerosus. Use the cream only as directed by your caregiver.  HOME CARE INSTRUCTIONS  Only take over-the-counter or prescription medicines as directed by your caregiver.   Keep the vaginal area as clean and dry as possible.  SEEK MEDICAL CARE IF: You develop increasing pain, swelling, or redness. Document Released: 04/05/2011 Document Revised: 11/02/2011 Document Reviewed: 04/05/2011 [facility] Patient Information 2012 [place], [place].s

## 2012-04-24 ENCOUNTER — Ambulatory Visit (INDEPENDENT_AMBULATORY_CARE_PROVIDER_SITE_OTHER): Payer: Medicare Other | Admitting: Family Medicine

## 2012-04-24 ENCOUNTER — Encounter: Payer: Self-pay | Admitting: Family Medicine

## 2012-04-24 VITALS — BP 120/77 | HR 66 | Temp 98.7°F | Ht 61.75 in | Wt 149.4 lb

## 2012-04-24 DIAGNOSIS — F341 Dysthymic disorder: Secondary | ICD-10-CM

## 2012-04-24 DIAGNOSIS — A0472 Enterocolitis due to Clostridium difficile, not specified as recurrent: Secondary | ICD-10-CM

## 2012-04-24 DIAGNOSIS — I10 Essential (primary) hypertension: Secondary | ICD-10-CM

## 2012-04-24 MED ORDER — PAROXETINE HCL 20 MG PO TABS: 20.0000 mg | ORAL_TABLET | Freq: Two times a day (BID) | ORAL | Status: DC

## 2012-04-24 MED ORDER — TRIAMTERENE-HCTZ 75-50 MG PO TABS: 1.0000 | ORAL_TABLET | Freq: Every day | ORAL | Status: DC

## 2012-04-24 MED ORDER — ALPRAZOLAM 0.5 MG PO TABS: 0.5000 mg | ORAL_TABLET | Freq: Three times a day (TID) | ORAL | Status: DC | PRN

## 2012-04-24 NOTE — Assessment & Plan Note (Signed)
New.  Pt was dx'd and tx'd after ER visit.  sxs have improved.  No longer having pain or diarrhea.  No signs of dehydration.  Will follow.

## 2012-04-24 NOTE — Progress Notes (Signed)
  Subjective:    Patient ID: Kristina Dougherty, female    DOB: [birth year], 66 y.o.   MRN: [id number]  HPI New to establish.  Previous MD- Scotty Court.    HTN- chronic problem, hx of being labile.  Well controlled today.  On Maxzide.  No CP, SOB, HAs, visual changes, edema.  Anxiety/Depression- chronic problem, feels sxs are well controlled on Paxil on xanax.  Denies SI/HI.  C Diff Colitis- was sick for 1 week prior to ER visit (4/23).  Was sick for 2-3 weeks after starting meds.  No longer having watery stools.  Is eating high fiber diet.  No longer having fever, minimal abdominal pain.   Review of Systems For ROS see HPI     Objective:   Physical Exam  Constitutional: She is oriented to person, place, and time. She appears well-developed and well-nourished. No distress.  HENT:  Head: Normocephalic and atraumatic.  Eyes: Conjunctivae and EOM are normal. Pupils are equal, round, and reactive to light.  Neck: Normal range of motion. Neck supple. No thyromegaly present.  Cardiovascular: Normal rate, regular rhythm, normal heart sounds and intact distal pulses.   No murmur heard. Pulmonary/Chest: Effort normal and breath sounds normal. No respiratory distress.  Abdominal: Soft. She exhibits no distension. There is no tenderness. There is no rebound and no guarding.  Musculoskeletal: She exhibits no edema.  Lymphadenopathy:    She has no cervical adenopathy.  Neurological: She is alert and oriented to person, place, and time.  Skin: Skin is warm and dry.  Psychiatric: She has a normal mood and affect. Her behavior is normal.          Assessment & Plan:

## 2012-04-24 NOTE — Assessment & Plan Note (Signed)
New to provider.  Chronic problem.  Well controlled today.  Asymptomatic.  No changes.  Will follow.

## 2012-04-24 NOTE — Assessment & Plan Note (Signed)
New to provider, chronic problem for pt.  Needs refills on meds, feels sxs are well controlled.  Will follow.

## 2012-04-24 NOTE — Patient Instructions (Signed)
Schedule your complete physical at your convenience Keep up the good work!  You look great! Call with any questions or concerns Welcome!  We're glad to have you!

## 2012-05-07 ENCOUNTER — Ambulatory Visit (INDEPENDENT_AMBULATORY_CARE_PROVIDER_SITE_OTHER): Payer: Medicare Other | Admitting: Gynecology

## 2012-05-07 ENCOUNTER — Encounter: Payer: Self-pay | Admitting: Gynecology

## 2012-05-07 VITALS — BP 120/80

## 2012-05-07 DIAGNOSIS — Z9889 Other specified postprocedural states: Secondary | ICD-10-CM

## 2012-05-07 NOTE — Progress Notes (Signed)
Patient 66 year old who was seen in the office on May 28 whereby she underwent a vulvar biopsy for vulvar dystrophy contributing to her pain and pruritus. Pathology report demonstrated the following:  Vulva, biopsy, right labia majora / periclitoral MELANOTIC MACULE EARLY LICHEN SCLEROSUS IN THE BACKGROUND. Microscopic Comment There are melanocytes along the dermal epidermal junction with broad based rete ridges and hyperpigmentation of the basal layer of the epidermis. There in no atypia. This is consistent with a hyperpigmented melanotic macule or lentigo. In addition, The epidermis has an effaced rete peg pattern with dense fibrosis of the papillary dermis with some vascular ectasia. A PAS stain fails to reveal any fungal organism. The findings are suggestive for early lichen sclerosus. There is no evidence of malignancy or high grade dysplasia in these sections.  Patient is doing well she stated she's 90% feeling better. The area from the biopsy near the clitoral hood is still slightly sensitive and hasn't healed completely. Patient had been started previously on clobetasol propionate 0.05% twice a day application. The area was inspected today her perineal body is completely healed at her labia majora still remnant on the periclitoral hood as well as the area from the previous biopsy no completely healed. Patient will continue on the cream twice a day for 2 more weeks and return to the office for followup appointment. Once this has completely healed she will apply the cream 2-3 times a week indefinitely. The above was discussed with the patient in detail and pictures shown. All questions rancher will follow accordingly.

## 2012-05-07 NOTE — Patient Instructions (Signed)
Lichen Sclerosus Lichen sclerosus is a skin problem. It can happen on any part of the body, but it commonly involves the anal or genital areas. Lichen sclerosus is not an infection or a fungus. Girls and women are more commonly affected than boys and men. CAUSES The cause is not known. It could be the result of an overactive immune system or a lack of certain hormones. Lichen sclerosus is not passed from one person to another (not contagious). SYMPTOMS Your skin may have:  Thin, wrinkled, white areas.   Thickened white areas.   Red and swollen patches.   Tears or cracks.   Bruising.   Blood blisters.   Severe itching.  You may also have pain, itching, or burning with urination. Constipation is also common in people with lichen sclerosus. DIAGNOSIS Your caregiver will do a physical exam. Sometimes, a tissue sample (biopsy) may be sent for testing. TREATMENT Treatment may involve putting a thin layer of medicated cream (topical steroid) over the areas with lichen sclerosus. Use the cream only as directed by your caregiver.  HOME CARE INSTRUCTIONS  Only take over-the-counter or prescription medicines as directed by your caregiver.   Keep the vaginal area as clean and dry as possible.  SEEK MEDICAL CARE IF: You develop increasing pain, swelling, or redness. Document Released: 04/05/2011 Document Revised: 11/02/2011 Document Reviewed: 04/05/2011 [employer] Patient Information 2012 [employer]. 

## 2012-05-24 ENCOUNTER — Ambulatory Visit: Payer: Medicare Other | Admitting: Gynecology

## 2012-05-31 ENCOUNTER — Encounter: Payer: Self-pay | Admitting: Gynecology

## 2012-05-31 ENCOUNTER — Ambulatory Visit (INDEPENDENT_AMBULATORY_CARE_PROVIDER_SITE_OTHER): Payer: Medicare Other | Admitting: Gynecology

## 2012-05-31 VITALS — BP 128/84

## 2012-05-31 DIAGNOSIS — L940 Localized scleroderma [morphea]: Secondary | ICD-10-CM

## 2012-05-31 DIAGNOSIS — L900 Lichen sclerosus et atrophicus: Secondary | ICD-10-CM

## 2012-05-31 NOTE — Progress Notes (Signed)
Patient presented to the office today as a followup after initiating treatment for her lichen sclerosis. She was started on clobetasol 0.05% on June 11. She states she's doing better now she is status irritated and burning sensation that she experienced before.  Pathology reported demonstrated the following: Vulva, biopsy, right labia majora / periclitoral  MELANOTIC MACULE  EARLY LICHEN SCLEROSUS IN THE BACKGROUND   Exam: Periclitoral hood biopsy site healing well. Still there scattered areas over the periclitoral hood and labia majora but to a lesser extent of the lichen sclerosis then was seen prior to initiating therapy.  Assessment/plan: Patient's lichen sclerosis healing well. She'll continue on the clobetasol twice a day and will return back to the office to see me in one month. Once the areas completely healed she will go on it 2-3 times a week. She states that sometime at the end of the year she will be moving to [place] and she will need to continue to have followup there as well.

## 2012-07-02 ENCOUNTER — Encounter: Payer: Self-pay | Admitting: Gynecology

## 2012-07-02 ENCOUNTER — Ambulatory Visit (INDEPENDENT_AMBULATORY_CARE_PROVIDER_SITE_OTHER): Payer: Medicare Other | Admitting: Gynecology

## 2012-07-02 VITALS — BP 132/80

## 2012-07-02 DIAGNOSIS — L900 Lichen sclerosus et atrophicus: Secondary | ICD-10-CM

## 2012-07-02 DIAGNOSIS — L940 Localized scleroderma [morphea]: Secondary | ICD-10-CM

## 2012-07-02 NOTE — Patient Instructions (Signed)
Start applying the Clobetasol once a day on Monday, Wednesday and Friday

## 2012-07-02 NOTE — Progress Notes (Signed)
Patient presented to the office for followup. In may 28th of this year patient had a vulvar biopsy as was a right labia majora and periclitoral region for:  MELANOTIC MACULE  EARLY LICHEN SCLEROSUS IN THE BACKGROUND   Patient was started on clobetasol propionate 0.05% twice a day. She continued on it twice being returned today for followup.  Periclitoral fluid biopsy site completely healed. The vulva right labia majora lichen sclerosus has almost completely cleared. Small patchy white areas are still noted. We will reduce her clobetasol to apply 3 times a week once a day. She is leaving for [place] at the end of the year and a would like to see here a couple weeks before she leaves to reexamine her. She states his been over 10 year since her last colonoscopy. She sometimes has diffuse lower abdominal discomfort. She's had past history of total hysterectomy (she stated that they removed her ovaries at the same time as well). I've recommended she followup with her gastroenterologist since she is overdue for her colonoscopy. I've given her Hemoccult card to submit to the office for testing. She refuses to have a mammogram.

## 2012-08-14 ENCOUNTER — Encounter: Payer: Self-pay | Admitting: Family Medicine

## 2012-08-14 ENCOUNTER — Ambulatory Visit (INDEPENDENT_AMBULATORY_CARE_PROVIDER_SITE_OTHER): Payer: Medicare Other | Admitting: Family Medicine

## 2012-08-14 VITALS — BP 126/77 | HR 84 | Temp 98.9°F | Ht 61.25 in | Wt 149.8 lb

## 2012-08-14 DIAGNOSIS — Z0000 Encounter for general adult medical examination without abnormal findings: Secondary | ICD-10-CM

## 2012-08-14 DIAGNOSIS — Z23 Encounter for immunization: Secondary | ICD-10-CM

## 2012-08-14 DIAGNOSIS — I10 Essential (primary) hypertension: Secondary | ICD-10-CM

## 2012-08-14 DIAGNOSIS — F341 Dysthymic disorder: Secondary | ICD-10-CM

## 2012-08-14 MED ORDER — HYDROCODONE-ACETAMINOPHEN 5-500 MG PO TABS: 1.0000 | ORAL_TABLET | Freq: Four times a day (QID) | ORAL | Status: AC | PRN

## 2012-08-14 NOTE — Patient Instructions (Addendum)
Follow up in 6 months- sooner if needed Keep up the good work!  You look great! We'll notify you of your lab results Call with any questions or concerns Have a great fall season!!!

## 2012-08-14 NOTE — Progress Notes (Signed)
  Subjective:    Patient ID: Kristina Dougherty, female    DOB: 14-Aug-1946, 66 y.o.   MRN: [id number]  HPI Here today for CPE.  Risk Factors: HTN- chronic problem, well controlled today.  Only taking Maxzide prn edema.  No CP, SOB, HAs, visual changes ,edema. Physical Activity: does regular yard work, no formal exercise Fall Risk: low risk, very steady on feet Depression: chronic problem, taking Paxil twice daily and xanax prn.  Feels sxs are well controlled on current meds Hearing: normal to conversational tones, slightly decreased to whispered voice ADL's: independent Cognitive: normal linear thought process, memory and attention intact Home Safety:  Safe at home, son is currently living w/ her Height, Weight, BMI, Visual Acuity: see vitals, vision corrected to 20/20 w/ glasses Counseling: last colonoscopy was 2003- not interested in repeating at this time.  Not interested in mammo or DEXA.  UTD on GYN- [facility] Ordered: See A&P Care Plan: See A&P    Review of Systems Patient reports no vision/ hearing changes, adenopathy,fever, weight change,  persistant/recurrent hoarseness , swallowing issues, chest pain, palpitations, edema, persistant/recurrent cough, hemoptysis, dyspnea (rest/exertional/paroxysmal nocturnal), gastrointestinal bleeding (melena, rectal bleeding), abdominal pain, significant heartburn, bowel changes, GU symptoms (dysuria, hematuria, incontinence), Gyn symptoms (abnormal  bleeding, pain),  syncope, focal weakness, memory loss, numbness & tingling, skin/hair/nail changes, abnormal bruising or bleeding, anxiety, or depression.     Objective:   Physical Exam General Appearance:    Alert, cooperative, no distress, appears stated age  Head:    Normocephalic, without obvious abnormality, atraumatic  Eyes:    PERRL, conjunctiva/corneas clear, EOM's intact, fundi    benign, both eyes  Ears:    Normal TM's and external ear canals, both ears  Nose:   Nares normal, septum  midline, mucosa normal, no drainage    or sinus tenderness  Throat:   Lips, mucosa, and tongue normal; teeth and gums normal  Neck:   Supple, symmetrical, trachea midline, no adenopathy;    Thyroid: no enlargement/tenderness/nodules  Back:     Symmetric, no curvature, ROM normal, no CVA tenderness  Lungs:     Clear to auscultation bilaterally, respirations unlabored  Chest Wall:    No tenderness or deformity   Heart:    Regular rate and rhythm, S1 and S2 normal, no murmur, rub   or gallop  Breast Exam:    Deferred to GYN  Abdomen:     Soft, non-tender, bowel sounds active all four quadrants,    no masses, no organomegaly  Genitalia:    Deferred to GYN  Rectal:    Extremities:   Extremities normal, atraumatic, no cyanosis or edema  Pulses:   2+ and symmetric all extremities  Skin:   Skin color, texture, turgor normal, no rashes or lesions  Lymph nodes:   Cervical, supraclavicular, and axillary nodes normal  Neurologic:   CNII-XII intact, normal strength, sensation and reflexes    throughout          Assessment & Plan:

## 2012-08-15 ENCOUNTER — Encounter: Payer: Self-pay | Admitting: *Deleted

## 2012-08-15 LAB — CBC WITH DIFFERENTIAL/PLATELET
Basophils Absolute: 0 10*3/uL (ref 0.0–0.1)
Eosinophils Relative: 1.2 % (ref 0.0–5.0)
Hemoglobin: 14.3 g/dL (ref 12.0–15.0)
Lymphocytes Relative: 40 % (ref 12.0–46.0)
Monocytes Relative: 5.1 % (ref 3.0–12.0)
Platelets: 197 10*3/uL (ref 150.0–400.0)
RDW: 13.9 % (ref 11.5–14.6)
WBC: 6.5 10*3/uL (ref 4.5–10.5)

## 2012-08-15 LAB — BASIC METABOLIC PANEL
CO2: 26 mEq/L (ref 19–32)
Chloride: 102 mEq/L (ref 96–112)
Potassium: 3.5 mEq/L (ref 3.5–5.1)
Sodium: 137 mEq/L (ref 135–145)

## 2012-08-15 LAB — HEPATIC FUNCTION PANEL
ALT: 14 U/L (ref 0–35)
AST: 24 U/L (ref 0–37)
Alkaline Phosphatase: 68 U/L (ref 39–117)
Bilirubin, Direct: 0.1 mg/dL (ref 0.0–0.3)
Total Protein: 6.8 g/dL (ref 6.0–8.3)

## 2012-08-15 LAB — LIPID PANEL
Total CHOL/HDL Ratio: 3
Triglycerides: 55 mg/dL (ref 0.0–149.0)

## 2012-08-15 LAB — LDL CHOLESTEROL, DIRECT: Direct LDL: 127 mg/dL

## 2012-08-20 NOTE — Assessment & Plan Note (Signed)
Chronic problem.  Well controlled today.  Asymptomatic.  Only taking BP meds prn for swelling.  Check labs.  No anticipated changes.

## 2012-08-20 NOTE — Assessment & Plan Note (Signed)
Chronic problem.  Unchanged.  Continue current meds.  No changes.

## 2012-08-20 NOTE — Assessment & Plan Note (Signed)
Pt's PE WNL.  Not interested in colonoscopy, mammo, DEXA.  Check labs.  Anticipatory guidance provided.

## 2012-11-22 ENCOUNTER — Telehealth: Payer: Self-pay | Admitting: Family Medicine

## 2012-11-22 ENCOUNTER — Encounter: Payer: Self-pay | Admitting: *Deleted

## 2012-11-22 MED ORDER — ALPRAZOLAM 0.5 MG PO TABS: 0.5000 mg | ORAL_TABLET | Freq: Three times a day (TID) | ORAL | Status: DC | PRN

## 2012-11-22 NOTE — Telephone Encounter (Signed)
Please advise on RF request.//AB/CMA 

## 2012-11-22 NOTE — Telephone Encounter (Signed)
Refill: Xanax 0.5mg  tab. Take one tablet by mouth three times daily as needed. Qty 90. Last fill 09-19-12

## 2012-11-22 NOTE — Telephone Encounter (Signed)
Ok for #90, 3 refills, needs to sign agreement and do UDS if not already done

## 2012-11-22 NOTE — Telephone Encounter (Signed)
Pt aware Rx ready for pickup and to sign agreement. 

## 2012-11-25 ENCOUNTER — Telehealth: Payer: Self-pay | Admitting: Family Medicine

## 2012-11-25 NOTE — Telephone Encounter (Signed)
Patient states the medication we gave her for arthritis did not touch her pain and she would like something stronger.

## 2012-11-26 NOTE — Telephone Encounter (Signed)
Spoke with Kristina Dougherty and she states that she does not remember med but it was given back in September. Per Chart Kristina Dougherty was given VICODIN 5-500.Please advise

## 2012-11-26 NOTE — Telephone Encounter (Signed)
That should have taken care of arthritis pain---- can give vicodin 10/ 325 mg #30 1 po q6 h prn---f/u with Dr Beverely Low, later this week or next week

## 2012-11-27 NOTE — Telephone Encounter (Signed)
Pt will need appt to discuss pain if it is not controlled.

## 2012-11-28 NOTE — Telephone Encounter (Signed)
Spoke with patient, patient aware of Dr.Tabori's response. Patient appeared very understanding and indicated she will call back to schedule an appointment later. Patient needed to check her schedule

## 2013-01-22 ENCOUNTER — Encounter: Payer: Self-pay | Admitting: Family Medicine

## 2013-02-05 ENCOUNTER — Telehealth: Payer: Self-pay | Admitting: Family Medicine

## 2013-02-05 NOTE — Telephone Encounter (Signed)
Patient states she is almost out of paxil. She called her pharmacy to request this last week, however I do not see anything in her chart. Patient is requesting Paxil be sent to [facility].

## 2013-02-05 NOTE — Telephone Encounter (Signed)
Please advise on RF request.  Last CPE:08-14-12 and was asked to f/u in 6 mos-no appt scheduled.//AB/CMA

## 2013-02-06 NOTE — Telephone Encounter (Signed)
Ok for 6 months 

## 2013-02-07 MED ORDER — PAROXETINE HCL 20 MG PO TABS: 20.0000 mg | ORAL_TABLET | Freq: Two times a day (BID) | ORAL | Status: DC

## 2013-02-07 NOTE — Telephone Encounter (Signed)
Rx sent to the pharmacy([facility]) by e-script.//AB/CMA

## 2013-09-23 ENCOUNTER — Other Ambulatory Visit: Payer: Self-pay | Admitting: Gynecology

## 2013-09-24 ENCOUNTER — Telehealth: Payer: Self-pay | Admitting: *Deleted

## 2013-09-24 NOTE — Telephone Encounter (Signed)
Pt went pick up Rx for temovate 0.5 % and the price has increased to $100. I don't see annual in notes, she was new patient back in may 2013. Pt if something cheaper could be sent? Please advise

## 2013-09-25 ENCOUNTER — Other Ambulatory Visit: Payer: Self-pay | Admitting: Gynecology

## 2013-09-25 MED ORDER — PIMECROLIMUS 1 % EX CREA: TOPICAL_CREAM | CUTANEOUS | Status: DC

## 2013-09-25 MED ORDER — PIMECROLIMUS 1 % EX CREA: TOPICAL_CREAM | Freq: Two times a day (BID) | CUTANEOUS | Status: DC

## 2013-09-25 NOTE — Telephone Encounter (Signed)
rx sent, pt informed.  

## 2013-09-25 NOTE — Telephone Encounter (Signed)
Temovate is clobetasol.  please call a prescription for Elidel cream to apply a small amount twice a week to the area. There are very limited amount of medications that will help with her lichen sclerosis so hopefully this will be an extensive?

## 2013-12-04 ENCOUNTER — Encounter: Payer: Self-pay | Admitting: Family Medicine

## 2013-12-04 ENCOUNTER — Ambulatory Visit (INDEPENDENT_AMBULATORY_CARE_PROVIDER_SITE_OTHER): Payer: Medicare Other | Admitting: Family Medicine

## 2013-12-04 VITALS — BP 200/100 | HR 60 | Temp 98.3°F | Resp 18 | Ht 63.0 in | Wt 170.0 lb

## 2013-12-04 DIAGNOSIS — I10 Essential (primary) hypertension: Secondary | ICD-10-CM

## 2013-12-04 DIAGNOSIS — F411 Generalized anxiety disorder: Secondary | ICD-10-CM

## 2013-12-04 DIAGNOSIS — Z23 Encounter for immunization: Secondary | ICD-10-CM

## 2013-12-04 MED ORDER — AMLODIPINE BESYLATE 10 MG PO TABS: 10.0000 mg | ORAL_TABLET | Freq: Every day | ORAL | Status: DC

## 2013-12-04 MED ORDER — PAROXETINE HCL 20 MG PO TABS: 20.0000 mg | ORAL_TABLET | Freq: Two times a day (BID) | ORAL | Status: DC

## 2013-12-04 NOTE — Progress Notes (Signed)
Subjective:    Patient ID: Kristina Dougherty, female    DOB: [birth year], 68 y.o.   MRN: [id number]  HPI Patient is a 68 year old white female who is here today to establish care. Her last colonoscopy was approximately 2004 and is due. She is also overdue for mammogram. She has a history of a hysterectomy and therefore does not require Pap smears. She is due for the pneumonia vaccine, the shingles vaccine, and a flu shot. Her last tetanus shot is less than 10 years. She has history of generalized anxiety disorder and depression for which she takes Paxil 20 mg by mouth twice a day. She's been on this medication for 20 years. When she tried to wean off the medication in the past she has terrible withdrawal and worsening anxiety and depression. She would like to continue this medication. For her blood pressure today in the office extremely high at 200/100. I repeated the blood pressure myself several times and at best it lower to 190/100. Looking at her previous office visits her blood pressure has always been well controlled. The patient states she is extremely anxious today. She denies any chest pain, shortness of breath, dyspnea on exertion. She is fasting and would like have fasting lab work. Past Medical History  Diagnosis Date  . Anxiety   . Depression   . Hypertension   . Arthritis    Current Outpatient Prescriptions on File Prior to Visit  Medication Sig Dispense Refill  . aspirin 81 MG tablet Take 81 mg by mouth daily.        . Multiple Vitamin (MULITIVITAMIN WITH MINERALS) TABS Take 1 tablet by mouth daily.       No current facility-administered medications on file prior to visit.   Allergies  Allergen Reactions  . Morphine And Related     Severe HA per pt  . Nitrofurantoin Nausea And Vomiting   History   Social History  . Marital Status: Divorced    Spouse Name: N/A    Number of Children: N/A  . Years of Education: N/A   Occupational History  . Not on file.   Social  History Main Topics  . Smoking status: Current Every Day Smoker -- 0.30 packs/day    Types: Cigarettes    Last Attempt to Quit: 08/28/2011  . Smokeless tobacco: Never Used  . Alcohol Use: No  . Drug Use: No  . Sexual Activity: No   Other Topics Concern  . Not on file   Social History Narrative  . No narrative on file   Family History  Problem Relation Age of Onset  . Hypertension Maternal Grandmother       Review of Systems  All other systems reviewed and are negative.       Objective:   Physical Exam  Vitals reviewed. Constitutional: She is oriented to person, place, and time. She appears well-developed and well-nourished. No distress.  Eyes: Conjunctivae and EOM are normal. Pupils are equal, round, and reactive to light. Right eye exhibits no discharge. Left eye exhibits no discharge. No scleral icterus.  Neck: Normal range of motion. Neck supple. No JVD present. No thyromegaly present.  Cardiovascular: Normal rate, regular rhythm and normal heart sounds.  Exam reveals no gallop and no friction rub.   No murmur heard. Pulmonary/Chest: Effort normal and breath sounds normal. No respiratory distress. She has no wheezes. She has no rales. She exhibits no tenderness.  Abdominal: Soft. Bowel sounds are normal. She exhibits no distension and no  mass. There is no tenderness. There is no rebound and no guarding.  Musculoskeletal: Normal range of motion. She exhibits no edema.  Lymphadenopathy:    She has no cervical adenopathy.  Neurological: She is alert and oriented to person, place, and time. She has normal reflexes. She displays normal reflexes. No cranial nerve deficit. She exhibits normal muscle tone. Coordination normal.  Skin: She is not diaphoretic.          Assessment & Plan:  1. HTN (hypertension) Although I believe her blood pressure is exaggerated today, I do believe she has hypertension. I will begin the patient on amlodipine 10 mg by mouth daily and  recommended she return in one week for a blood pressure recheck. Will also check a CBC, CMP, and fasting lipid panel. - amLODipine (NORVASC) 10 MG tablet; Take 1 tablet (10 mg total) by mouth daily.  Dispense: 30 tablet; Refill: 3 - COMPLETE METABOLIC PANEL WITH GFR - Lipid panel - CBC with Differential  2. Generalized anxiety disorder I refilled the patient's Paxil 20 mg by mouth twice a day.  With regards to extension care I recommended a colonoscopy and mammogram. The patient declines both of them at this time. She does not want further cancer screening. She states she will consider it in the future but at present time she is not interested. I also recommended a pneumonia vaccine as well as Zostavax. The patient will give this some thought but does not want to proceed with that at the present time. I will see her back in one week for recheck

## 2013-12-05 LAB — CBC WITH DIFFERENTIAL/PLATELET
Basophils Absolute: 0.1 10*3/uL (ref 0.0–0.1)
Basophils Relative: 1 % (ref 0–1)
EOS ABS: 0.1 10*3/uL (ref 0.0–0.7)
EOS PCT: 2 % (ref 0–5)
HEMATOCRIT: 46.1 % — AB (ref 36.0–46.0)
Hemoglobin: 15.8 g/dL — ABNORMAL HIGH (ref 12.0–15.0)
LYMPHS ABS: 2.7 10*3/uL (ref 0.7–4.0)
LYMPHS PCT: 35 % (ref 12–46)
MCH: 28.5 pg (ref 26.0–34.0)
MCHC: 34.3 g/dL (ref 30.0–36.0)
MCV: 83.1 fL (ref 78.0–100.0)
MONO ABS: 0.4 10*3/uL (ref 0.1–1.0)
Monocytes Relative: 6 % (ref 3–12)
Neutro Abs: 4.4 10*3/uL (ref 1.7–7.7)
Neutrophils Relative %: 56 % (ref 43–77)
PLATELETS: 207 10*3/uL (ref 150–400)
RBC: 5.55 MIL/uL — AB (ref 3.87–5.11)
RDW: 12.9 % (ref 11.5–15.5)
WBC: 7.8 10*3/uL (ref 4.0–10.5)

## 2013-12-05 LAB — COMPLETE METABOLIC PANEL WITH GFR
ALBUMIN: 4.6 g/dL (ref 3.5–5.2)
ALK PHOS: 91 U/L (ref 39–117)
ALT: 14 U/L (ref 0–35)
AST: 21 U/L (ref 0–37)
BILIRUBIN TOTAL: 0.5 mg/dL (ref 0.3–1.2)
BUN: 10 mg/dL (ref 6–23)
CO2: 30 meq/L (ref 19–32)
Calcium: 9.9 mg/dL (ref 8.4–10.5)
Chloride: 103 mEq/L (ref 96–112)
Creat: 0.63 mg/dL (ref 0.50–1.10)
GLUCOSE: 84 mg/dL (ref 70–99)
Potassium: 4.2 mEq/L (ref 3.5–5.3)
Sodium: 143 mEq/L (ref 135–145)
Total Protein: 7.1 g/dL (ref 6.0–8.3)

## 2013-12-05 LAB — LIPID PANEL
CHOL/HDL RATIO: 3.6 ratio
CHOLESTEROL: 209 mg/dL — AB (ref 0–200)
HDL: 58 mg/dL (ref >39)
LDL Cholesterol: 134 mg/dL — ABNORMAL HIGH (ref 0–99)
Triglycerides: 87 mg/dL (ref <150)
VLDL: 17 mg/dL (ref 0–40)

## 2013-12-05 NOTE — Addendum Note (Signed)
Addended by: Shary Decamp B on: 12/05/2013 08:52 AM   Modules accepted: Orders

## 2013-12-15 ENCOUNTER — Ambulatory Visit: Payer: Medicare Other | Admitting: Family Medicine

## 2013-12-15 VITALS — BP 150/100

## 2013-12-15 DIAGNOSIS — I10 Essential (primary) hypertension: Secondary | ICD-10-CM

## 2013-12-15 NOTE — Patient Instructions (Signed)
Pts BP was 168/98 at [facility] this past weekend.

## 2014-09-28 ENCOUNTER — Encounter: Payer: Self-pay | Admitting: Family Medicine

## 2015-01-01 ENCOUNTER — Other Ambulatory Visit: Payer: Self-pay | Admitting: Gynecology

## 2015-01-05 ENCOUNTER — Other Ambulatory Visit: Payer: Self-pay | Admitting: Family Medicine

## 2015-01-07 ENCOUNTER — Other Ambulatory Visit: Payer: Self-pay | Admitting: Gynecology

## 2015-03-08 ENCOUNTER — Other Ambulatory Visit: Payer: Commercial Managed Care - HMO

## 2015-03-08 DIAGNOSIS — I10 Essential (primary) hypertension: Secondary | ICD-10-CM | POA: Diagnosis not present

## 2015-03-08 DIAGNOSIS — Z0000 Encounter for general adult medical examination without abnormal findings: Secondary | ICD-10-CM

## 2015-03-08 DIAGNOSIS — F329 Major depressive disorder, single episode, unspecified: Secondary | ICD-10-CM

## 2015-03-08 DIAGNOSIS — Z79899 Other long term (current) drug therapy: Secondary | ICD-10-CM | POA: Diagnosis not present

## 2015-03-08 DIAGNOSIS — F341 Dysthymic disorder: Secondary | ICD-10-CM | POA: Diagnosis not present

## 2015-03-08 DIAGNOSIS — E559 Vitamin D deficiency, unspecified: Secondary | ICD-10-CM | POA: Diagnosis not present

## 2015-03-08 LAB — CBC WITH DIFFERENTIAL/PLATELET
BASOS ABS: 0.1 10*3/uL (ref 0.0–0.1)
BASOS PCT: 1 % (ref 0–1)
EOS PCT: 3 % (ref 0–5)
Eosinophils Absolute: 0.2 10*3/uL (ref 0.0–0.7)
HEMATOCRIT: 44.6 % (ref 36.0–46.0)
HEMOGLOBIN: 15.2 g/dL — AB (ref 12.0–15.0)
Lymphocytes Relative: 31 % (ref 12–46)
Lymphs Abs: 2 10*3/uL (ref 0.7–4.0)
MCH: 28.5 pg (ref 26.0–34.0)
MCHC: 34.1 g/dL (ref 30.0–36.0)
MCV: 83.5 fL (ref 78.0–100.0)
MONO ABS: 0.4 10*3/uL (ref 0.1–1.0)
MPV: 11.1 fL (ref 8.6–12.4)
Monocytes Relative: 6 % (ref 3–12)
NEUTROS ABS: 3.8 10*3/uL (ref 1.7–7.7)
Neutrophils Relative %: 59 % (ref 43–77)
PLATELETS: 197 10*3/uL (ref 150–400)
RBC: 5.34 MIL/uL — ABNORMAL HIGH (ref 3.87–5.11)
RDW: 13.5 % (ref 11.5–15.5)
WBC: 6.4 10*3/uL (ref 4.0–10.5)

## 2015-03-08 LAB — COMPLETE METABOLIC PANEL WITH GFR
ALK PHOS: 71 U/L (ref 39–117)
ALT: 16 U/L (ref 0–35)
AST: 21 U/L (ref 0–37)
Albumin: 3.7 g/dL (ref 3.5–5.2)
BILIRUBIN TOTAL: 0.5 mg/dL (ref 0.2–1.2)
BUN: 14 mg/dL (ref 6–23)
CO2: 25 mEq/L (ref 19–32)
CREATININE: 0.68 mg/dL (ref 0.50–1.10)
Calcium: 8.8 mg/dL (ref 8.4–10.5)
Chloride: 107 mEq/L (ref 96–112)
GFR, Est African American: >89 mL/min
GFR, Est Non African American: 89 mL/min
Glucose, Bld: 92 mg/dL (ref 70–99)
Potassium: 4.1 mEq/L (ref 3.5–5.3)
Sodium: 140 mEq/L (ref 135–145)
Total Protein: 5.9 g/dL — ABNORMAL LOW (ref 6.0–8.3)

## 2015-03-08 LAB — LIPID PANEL
CHOL/HDL RATIO: 3.6 ratio
CHOLESTEROL: 178 mg/dL (ref 0–200)
HDL: 50 mg/dL (ref >=46)
LDL Cholesterol: 108 mg/dL — ABNORMAL HIGH (ref 0–99)
TRIGLYCERIDES: 100 mg/dL (ref <150)
VLDL: 20 mg/dL (ref 0–40)

## 2015-03-08 LAB — TSH: TSH: 1.895 u[IU]/mL (ref 0.350–4.500)

## 2015-03-09 LAB — VITAMIN D 25 HYDROXY (VIT D DEFICIENCY, FRACTURES): VIT D 25 HYDROXY: 24 ng/mL — AB (ref 30–100)

## 2015-03-10 ENCOUNTER — Ambulatory Visit (INDEPENDENT_AMBULATORY_CARE_PROVIDER_SITE_OTHER): Payer: Commercial Managed Care - HMO | Admitting: Family Medicine

## 2015-03-10 ENCOUNTER — Encounter: Payer: Self-pay | Admitting: Family Medicine

## 2015-03-10 VITALS — BP 138/94 | HR 76 | Temp 98.7°F | Resp 18 | Ht 63.0 in | Wt 175.0 lb

## 2015-03-10 DIAGNOSIS — Z1231 Encounter for screening mammogram for malignant neoplasm of breast: Secondary | ICD-10-CM | POA: Diagnosis not present

## 2015-03-10 DIAGNOSIS — M2550 Pain in unspecified joint: Secondary | ICD-10-CM | POA: Diagnosis not present

## 2015-03-10 DIAGNOSIS — Z0000 Encounter for general adult medical examination without abnormal findings: Secondary | ICD-10-CM

## 2015-03-10 DIAGNOSIS — I10 Essential (primary) hypertension: Secondary | ICD-10-CM

## 2015-03-10 MED ORDER — MELOXICAM 15 MG PO TABS: 15.0000 mg | ORAL_TABLET | Freq: Every day | ORAL | Status: DC

## 2015-03-10 MED ORDER — CLOBETASOL PROPIONATE 0.05 % EX CREA
1.0000 | TOPICAL_CREAM | Freq: Two times a day (BID) | CUTANEOUS | Status: DC
Start: 2015-03-10 — End: 2016-07-24

## 2015-03-10 MED ORDER — AMLODIPINE BESYLATE 10 MG PO TABS: 10.0000 mg | ORAL_TABLET | Freq: Every day | ORAL | Status: DC

## 2015-03-10 NOTE — Progress Notes (Signed)
Subjective:    Patient ID: Kristina Dougherty, female    DOB: [birth year], 69 y.o.   MRN: [id number]  HPI Patient is here today for complete physical exam. She also complains of bilateral knee pain which is worse with activity she has a history of arthritis in both knees. She is also complaining of itching in her vaginal area from time to time. She has a history of lichen sclerosis in the perineum. She has used clobetasol cream they're successfully in the past to help manage this. She would like a refill on this prescription. She also has a history of hypertension for which she is currently taking amlodipine. She is overdue for mammogram. She is overdue for colonoscopy. She does not require a Pap smear due to the fact she is over 58 years of age and she also has a history of total abdominal hysterectomy. She has had Pneumovax 23. She has had Prevnar 13. She declines bone density test. Past Medical History  Diagnosis Date  . Anxiety   . Depression   . Hypertension   . Arthritis    Past Surgical History  Procedure Laterality Date  . Appendectomy    . Abdominal hysterectomy     Current Outpatient Prescriptions on File Prior to Visit  Medication Sig Dispense Refill  . aspirin 81 MG tablet Take 81 mg by mouth daily.      . Multiple Vitamin (MULITIVITAMIN WITH MINERALS) TABS Take 1 tablet by mouth daily.    Marland Kitchen PARoxetine (PAXIL) 20 MG tablet TAKE ONE TABLET BY MOUTH TWICE DAILY 60 tablet 1   No current facility-administered medications on file prior to visit.   Allergies  Allergen Reactions  . Morphine And Related     Severe HA per pt  . Nitrofurantoin Nausea And Vomiting   History   Social History  . Marital Status: Divorced    Spouse Name: N/A  . Number of Children: N/A  . Years of Education: N/A   Occupational History  . Not on file.   Social History Main Topics  . Smoking status: Current Every Day Smoker -- 0.30 packs/day    Types: Cigarettes    Last Attempt to Quit:  08/28/2011  . Smokeless tobacco: Never Used  . Alcohol Use: No  . Drug Use: No  . Sexual Activity: No   Other Topics Concern  . Not on file   Social History Narrative      Review of Systems  All other systems reviewed and are negative.      Objective:   Physical Exam  Constitutional: She is oriented to person, place, and time. She appears well-developed and well-nourished. No distress.  HENT:  Head: Normocephalic and atraumatic.  Right Ear: External ear normal.  Left Ear: External ear normal.  Nose: Nose normal.  Mouth/Throat: Oropharynx is clear and moist. No oropharyngeal exudate.  Eyes: Conjunctivae and EOM are normal. Pupils are equal, round, and reactive to light. Right eye exhibits no discharge. Left eye exhibits no discharge. No scleral icterus.  Neck: Normal range of motion. Neck supple. No JVD present. No tracheal deviation present. No thyromegaly present.  Cardiovascular: Normal rate, regular rhythm, normal heart sounds and intact distal pulses.  Exam reveals no gallop and no friction rub.   No murmur heard. Pulmonary/Chest: Effort normal and breath sounds normal. No stridor. No respiratory distress. She has no wheezes. She has no rales. She exhibits no tenderness.  Abdominal: Soft. Bowel sounds are normal. She exhibits no distension and no  mass. There is no tenderness. There is no rebound and no guarding.  Musculoskeletal: Normal range of motion. She exhibits no edema or tenderness.  Lymphadenopathy:    She has no cervical adenopathy.  Neurological: She is alert and oriented to person, place, and time. She has normal reflexes. She displays normal reflexes. No cranial nerve deficit. She exhibits normal muscle tone. Coordination normal.  Skin: Skin is warm. No rash noted. She is not diaphoretic. No erythema. No pallor.  Psychiatric: She has a normal mood and affect. Her behavior is normal. Judgment and thought content normal.  Vitals reviewed.     Lab on  03/08/2015  Component Date Value Ref Range Status  . Vit D, 25-Hydroxy 03/08/2015 24* 30 - 100 ng/mL Final   Comment: Vitamin D Status           25-OH Vitamin D        Deficiency                <20 ng/mL        Insufficiency         20 - 29 ng/mL        Optimal             > or = 30 ng/mL   For 25-OH Vitamin D testing on patients on D2-supplementation and patients for whom quantitation of D2 and D3 fractions is required, the [facility] 25-OH VIT D, (D2,D3), LC/MS/MS is recommended: order code [phone] [phone] (patients > 2 yrs).   . WBC 03/08/2015 6.4  4.0 - 10.5 K/uL Final  . RBC 03/08/2015 5.34* 3.87 - 5.11 MIL/uL Final  . Hemoglobin 03/08/2015 15.2* 12.0 - 15.0 g/dL Final  . HCT 03/08/2015 44.6  36.0 - 46.0 % Final  . MCV 03/08/2015 83.5  78.0 - 100.0 fL Final  . MCH 03/08/2015 28.5  26.0 - 34.0 pg Final  . MCHC 03/08/2015 34.1  30.0 - 36.0 g/dL Final  . RDW 03/08/2015 13.5  11.5 - 15.5 % Final  . Platelets 03/08/2015 197  150 - 400 K/uL Final  . MPV 03/08/2015 11.1  8.6 - 12.4 fL Final  . Neutrophils Relative % 03/08/2015 59  43 - 77 % Final  . Neutro Abs 03/08/2015 3.8  1.7 - 7.7 K/uL Final  . Lymphocytes Relative 03/08/2015 31  12 - 46 % Final  . Lymphs Abs 03/08/2015 2.0  0.7 - 4.0 K/uL Final  . Monocytes Relative 03/08/2015 6  3 - 12 % Final  . Monocytes Absolute 03/08/2015 0.4  0.1 - 1.0 K/uL Final  . Eosinophils Relative 03/08/2015 3  0 - 5 % Final  . Eosinophils Absolute 03/08/2015 0.2  0.0 - 0.7 K/uL Final  . Basophils Relative 03/08/2015 1  0 - 1 % Final  . Basophils Absolute 03/08/2015 0.1  0.0 - 0.1 K/uL Final  . Smear Review 03/08/2015 Criteria for review not met   Final  . Cholesterol 03/08/2015 178  0 - 200 mg/dL Final   Comment: ATP III Classification:       < 200        mg/dL        Desirable      200 - 239     mg/dL        Borderline High      >= 240        mg/dL        High     . Triglycerides 03/08/2015 100  <150 mg/dL Final  .  HDL 03/08/2015 50  >=46 mg/dL  Final   ** Please note change in reference range(s). **  . Total CHOL/HDL Ratio 03/08/2015 3.6   Final  . VLDL 03/08/2015 20  0 - 40 mg/dL Final  . LDL Cholesterol 03/08/2015 108* 0 - 99 mg/dL Final   Comment:   Total Cholesterol/HDL Ratio:CHD Risk                        Coronary Heart Disease Risk Table                                        Men       Women          1/2 Average Risk              3.4        3.3              Average Risk              5.0        4.4           2X Average Risk              9.6        7.1           3X Average Risk             23.4       11.0 Use the calculated Patient Ratio above and the CHD Risk table  to determine the patient's CHD Risk. ATP III Classification (LDL):       < 100        mg/dL         Optimal      100 - 129     mg/dL         Near or Above Optimal      130 - 159     mg/dL         Borderline High      160 - 189     mg/dL         High       > 190        mg/dL         Very High     . TSH 03/08/2015 1.895  0.350 - 4.500 uIU/mL Final  . Sodium 03/08/2015 140  135 - 145 mEq/L Final  . Potassium 03/08/2015 4.1  3.5 - 5.3 mEq/L Final  . Chloride 03/08/2015 107  96 - 112 mEq/L Final  . CO2 03/08/2015 25  19 - 32 mEq/L Final  . Glucose, Bld 03/08/2015 92  70 - 99 mg/dL Final  . BUN 03/08/2015 14  6 - 23 mg/dL Final  . Creat 03/08/2015 0.68  0.50 - 1.10 mg/dL Final  . Total Bilirubin 03/08/2015 0.5  0.2 - 1.2 mg/dL Final  . Alkaline Phosphatase 03/08/2015 71  39 - 117 U/L Final  . AST 03/08/2015 21  0 - 37 U/L Final  . ALT 03/08/2015 16  0 - 35 U/L Final  . Total Protein 03/08/2015 5.9* 6.0 - 8.3 g/dL Final  . Albumin 03/08/2015 3.7  3.5 - 5.2 g/dL Final  . Calcium 03/08/2015 8.8  8.4 - 10.5 mg/dL Final  . GFR, Est African American 03/08/2015 >89   Final  .  GFR, Est Non African American 03/08/2015 89   Final   Comment:   The estimated GFR is a calculation valid for adults (>=68 years old) that uses the CKD-EPI algorithm to adjust for age and  sex. It is   not to be used for children, pregnant women, hospitalized patients,    patients on dialysis, or with rapidly changing kidney function. According to the NKDEP, eGFR >89 is normal, 60-89 shows mild impairment, 30-59 shows moderate impairment, 15-29 shows severe impairment and <15 is ESRD.          Assessment & Plan:  Arthralgia - Plan: meloxicam (MOBIC) 15 MG tablet  Essential hypertension - Plan: amLODipine (NORVASC) 10 MG tablet  Routine general medical examination at a health care facility - Plan: Ambulatory referral to Gastroenterology  Screening mammogram for high-risk patient - Plan: MM Digital Screening  I believe the patient's knee pain is likely due to osteoarthritis. I recommended meloxicam 15 mg by mouth daily. I reviewed her most recent lab work. Her CMP, fasting lipid panel, CBC, and TSH are all within normal limits and excellent. Her vitamin D is slightly low and I have recommended thousand milligrams a day of calcium and 1000 units  a day of vitamin D.  I will schedule the patient for mammogram. I will schedule the patient for colonoscopy. I recommended a bone density but she declined. Her immunizations are up-to-date. I did recommend the shingles vaccine but she will check with her insurance and determine the cost prior to receiving the shingles vaccine. Her blood pressure is slightly elevated today. I have asked the patient check her blood pressure daily and report the values to me in one week. If her diastolic blood pressure is consistently greater than 90, I would add hydrochlorothiazide

## 2015-03-11 ENCOUNTER — Telehealth: Payer: Self-pay | Admitting: Family Medicine

## 2015-03-11 NOTE — Telephone Encounter (Signed)
Patient is calling to let dr pickard know that her blood pressure this morning was 150/91 [phone] patient was here yesterday to see him

## 2015-03-11 NOTE — Telephone Encounter (Signed)
MD please advise

## 2015-03-15 ENCOUNTER — Encounter: Payer: Self-pay | Admitting: Internal Medicine

## 2015-03-15 MED ORDER — HYDROCHLOROTHIAZIDE 12.5 MG PO CAPS: 12.5000 mg | ORAL_CAPSULE | Freq: Every day | ORAL | Status: DC

## 2015-03-15 NOTE — Telephone Encounter (Signed)
That is higher than her goal BP.  I would add HCTZ 12.5 mg poqday and recheck in 1 month.

## 2015-03-15 NOTE — Telephone Encounter (Signed)
Call placed to patient and patient made aware.   Prescription sent to pharmacy.  

## 2015-03-16 ENCOUNTER — Encounter: Payer: Self-pay | Admitting: Internal Medicine

## 2015-04-05 ENCOUNTER — Other Ambulatory Visit: Payer: Self-pay | Admitting: Family Medicine

## 2015-04-15 ENCOUNTER — Telehealth: Payer: Self-pay | Admitting: Family Medicine

## 2015-04-15 ENCOUNTER — Encounter: Payer: Self-pay | Admitting: Family Medicine

## 2015-04-15 ENCOUNTER — Ambulatory Visit (INDEPENDENT_AMBULATORY_CARE_PROVIDER_SITE_OTHER): Payer: Commercial Managed Care - HMO | Admitting: Family Medicine

## 2015-04-15 VITALS — BP 152/100 | HR 80 | Temp 98.8°F | Resp 18 | Ht 63.0 in | Wt 172.0 lb

## 2015-04-15 DIAGNOSIS — R300 Dysuria: Secondary | ICD-10-CM | POA: Diagnosis not present

## 2015-04-15 DIAGNOSIS — N390 Urinary tract infection, site not specified: Secondary | ICD-10-CM | POA: Diagnosis not present

## 2015-04-15 LAB — URINALYSIS, ROUTINE W REFLEX MICROSCOPIC
Bilirubin Urine: NEGATIVE
Glucose, UA: NEGATIVE mg/dL
Ketones, ur: NEGATIVE mg/dL
Nitrite: NEGATIVE
PROTEIN: NEGATIVE mg/dL
UROBILINOGEN UA: 0.2 mg/dL (ref 0.0–1.0)
pH: 5.5 (ref 5.0–8.0)

## 2015-04-15 LAB — URINALYSIS, MICROSCOPIC ONLY
Casts: NONE SEEN
Crystals: NONE SEEN

## 2015-04-15 MED ORDER — ESTRADIOL 0.1 MG/GM VA CREA: 1.0000 | TOPICAL_CREAM | Freq: Every day | VAGINAL | Status: DC

## 2015-04-15 MED ORDER — CIPROFLOXACIN HCL 500 MG PO TABS: 500.0000 mg | ORAL_TABLET | Freq: Two times a day (BID) | ORAL | Status: DC

## 2015-04-15 NOTE — Telephone Encounter (Signed)
Ms Longbottom calling regarding the estrace cream, it cost over $300, can't afford it, wants to try something else [phone]

## 2015-04-15 NOTE — Progress Notes (Signed)
Subjective:    Patient ID: Kristina Dougherty, female    DOB: [birth year], 69 y.o.   MRN: [id number]  HPI Patient reports burning with urination.  She also reports urgency, frequency, and hesitancy.  This has been going on for 5 days.  Urinalysis shows trace blood, trace leukocyte esterase. Patient states that she has a history of recurrent urinary tract infections. 3 years ago she went through numerous urinary tract infections and several referrals to urology. Eventually she was referred to a gynecologist who began treating her lichen sclerosis with clobetasol. This seemed to help her vaginal dryness immensely and this help prevent her urinary tract infections. She's been using this 2 or 3 times a week however she believes she is becoming somewhat immune to it and she continues to complain of vaginal dryness. She has a history of a hysterectomy and premature menopause secondary to hysterectomy. Past Medical History  Diagnosis Date  . Anxiety   . Depression   . Hypertension   . Arthritis    Past Surgical History  Procedure Laterality Date  . Appendectomy    . Abdominal hysterectomy     Current Outpatient Prescriptions on File Prior to Visit  Medication Sig Dispense Refill  . amLODipine (NORVASC) 10 MG tablet Take 1 tablet (10 mg total) by mouth daily. 30 tablet 11  . aspirin 81 MG tablet Take 81 mg by mouth daily.      . clobetasol cream (TEMOVATE) 6.31 % Apply 1 application topically 2 (two) times daily. 30 g 3  . hydrochlorothiazide (MICROZIDE) 12.5 MG capsule Take 1 capsule (12.5 mg total) by mouth daily. 30 capsule 3  . meloxicam (MOBIC) 15 MG tablet TAKE ONE TABLET BY MOUTH ONCE DAILY 30 tablet 5  . Multiple Vitamin (MULITIVITAMIN WITH MINERALS) TABS Take 1 tablet by mouth daily.    Marland Kitchen PARoxetine (PAXIL) 20 MG tablet TAKE ONE TABLET BY MOUTH TWICE DAILY 60 tablet 1   No current facility-administered medications on file prior to visit.   Allergies  Allergen Reactions  . Morphine  And Related     Severe HA per pt  . Nitrofurantoin Nausea And Vomiting   History   Social History  . Marital Status: Divorced    Spouse Name: N/A  . Number of Children: N/A  . Years of Education: N/A   Occupational History  . Not on file.   Social History Main Topics  . Smoking status: Current Every Day Smoker -- 0.30 packs/day    Types: Cigarettes    Last Attempt to Quit: 08/28/2011  . Smokeless tobacco: Never Used  . Alcohol Use: No  . Drug Use: No  . Sexual Activity: No   Other Topics Concern  . Not on file   Social History Narrative      Review of Systems  All other systems reviewed and are negative.      Objective:   Physical Exam  Constitutional: She appears well-developed and well-nourished.  Cardiovascular: Normal rate, regular rhythm and normal heart sounds.   No murmur heard. Pulmonary/Chest: Effort normal and breath sounds normal. No respiratory distress. She has no wheezes. She has no rales.  Abdominal: Soft. Bowel sounds are normal.  Vitals reviewed.         Assessment & Plan:  Burning with urination - Plan: Urinalysis, Routine w reflex microscopic  I will treat the urinary tract infection with Cipro 500 mg by mouth twice a day for 3 days. I will check a urine culture. To prevent  recurrent urinary tract infections, I recommended using Estrace vaginal cream 1 g intravaginally each night. Recheck in one week if no better for a pelvic exam. Patient would like to avoid the pelvic exam today and states that she would consent to a pelvic exam if no better.

## 2015-04-18 LAB — URINE CULTURE: Colony Count: 75000

## 2015-04-20 ENCOUNTER — Telehealth: Payer: Self-pay | Admitting: Family Medicine

## 2015-04-20 MED ORDER — ESTRADIOL 10 MCG VA TABS: ORAL_TABLET | VAGINAL | Status: DC

## 2015-04-20 NOTE — Telephone Encounter (Signed)
[phone] PT is rtn your call about lab work

## 2015-04-20 NOTE — Telephone Encounter (Signed)
Patient aware of results and med sent to pharm 

## 2015-04-21 ENCOUNTER — Telehealth: Payer: Self-pay | Admitting: Family Medicine

## 2015-04-21 NOTE — Telephone Encounter (Signed)
Pt called and states that the Vagifem is close to $200 dollars - is there anything else that can be called in for her or recommendations for something OTC

## 2015-04-22 NOTE — Telephone Encounter (Signed)
Can we call a pharm and find out what topical estrogen cream is cheapest?

## 2015-04-23 NOTE — Telephone Encounter (Signed)
Pt states that she is still having some burning with urination and wanted to know if she needed more anitbx or if this was from the dryness??  Did call around and all creams are expensive d/t them being name brand - did recommend to pt to try OTC replens. Pt states that she may just go ahead and the the prescription.

## 2015-04-23 NOTE — Telephone Encounter (Signed)
I would recommend the prescription to see if the burning improves.

## 2015-04-25 ENCOUNTER — Other Ambulatory Visit: Payer: Self-pay | Admitting: Family Medicine

## 2015-04-27 NOTE — Telephone Encounter (Signed)
Patient aware and will try to get one RX to see if it helps

## 2015-05-04 ENCOUNTER — Ambulatory Visit: Payer: Medicare Other | Admitting: Internal Medicine

## 2015-06-23 ENCOUNTER — Telehealth: Payer: Self-pay | Admitting: Family Medicine

## 2015-06-23 NOTE — Telephone Encounter (Signed)
PA submitted for quantity exception for Paroxetine thru [facility] [id number]  Case # [id number]

## 2015-06-24 NOTE — Telephone Encounter (Signed)
PA approved for Paroxetine 60 tabs per 30 days.  Approved thru 06/23/2016.  PA faxed to pharmacy

## 2015-10-12 ENCOUNTER — Ambulatory Visit (INDEPENDENT_AMBULATORY_CARE_PROVIDER_SITE_OTHER): Payer: Commercial Managed Care - HMO | Admitting: Family Medicine

## 2015-10-12 DIAGNOSIS — Z23 Encounter for immunization: Secondary | ICD-10-CM

## 2015-12-01 ENCOUNTER — Other Ambulatory Visit: Payer: Self-pay | Admitting: *Deleted

## 2015-12-01 MED ORDER — PAROXETINE HCL 20 MG PO TABS: 20.0000 mg | ORAL_TABLET | Freq: Two times a day (BID) | ORAL | Status: DC

## 2015-12-01 MED ORDER — MELOXICAM 15 MG PO TABS: 15.0000 mg | ORAL_TABLET | Freq: Every day | ORAL | Status: DC

## 2015-12-01 NOTE — Telephone Encounter (Signed)
Received fax requesting refill on Paxil and Mobic.   Refill appropriate and filled per protocol.

## 2016-01-27 ENCOUNTER — Other Ambulatory Visit: Payer: Self-pay | Admitting: Family Medicine

## 2016-01-28 NOTE — Telephone Encounter (Signed)
Refill appropriate and filled per protocol. 

## 2016-03-15 ENCOUNTER — Other Ambulatory Visit: Payer: Commercial Managed Care - HMO

## 2016-03-15 DIAGNOSIS — I10 Essential (primary) hypertension: Secondary | ICD-10-CM

## 2016-03-15 DIAGNOSIS — Z0000 Encounter for general adult medical examination without abnormal findings: Secondary | ICD-10-CM | POA: Diagnosis not present

## 2016-03-15 DIAGNOSIS — F329 Major depressive disorder, single episode, unspecified: Secondary | ICD-10-CM

## 2016-03-15 DIAGNOSIS — Z79899 Other long term (current) drug therapy: Secondary | ICD-10-CM

## 2016-03-15 DIAGNOSIS — F341 Dysthymic disorder: Secondary | ICD-10-CM | POA: Diagnosis not present

## 2016-03-15 DIAGNOSIS — F419 Anxiety disorder, unspecified: Secondary | ICD-10-CM

## 2016-03-15 DIAGNOSIS — E559 Vitamin D deficiency, unspecified: Secondary | ICD-10-CM

## 2016-03-15 DIAGNOSIS — F418 Other specified anxiety disorders: Secondary | ICD-10-CM | POA: Diagnosis not present

## 2016-03-15 LAB — COMPLETE METABOLIC PANEL WITH GFR
ALT: 12 U/L (ref 6–29)
AST: 20 U/L (ref 10–35)
Albumin: 4 g/dL (ref 3.6–5.1)
Alkaline Phosphatase: 70 U/L (ref 33–130)
BILIRUBIN TOTAL: 0.5 mg/dL (ref 0.2–1.2)
BUN: 14 mg/dL (ref 7–25)
CHLORIDE: 105 mmol/L (ref 98–110)
CO2: 27 mmol/L (ref 20–31)
CREATININE: 0.84 mg/dL (ref 0.60–0.93)
Calcium: 9.5 mg/dL (ref 8.6–10.4)
GFR, EST AFRICAN AMERICAN: 81 mL/min (ref >=60)
GFR, Est Non African American: 70 mL/min (ref >=60)
GLUCOSE: 100 mg/dL — AB (ref 70–99)
Potassium: 4.9 mmol/L (ref 3.5–5.3)
SODIUM: 143 mmol/L (ref 135–146)
TOTAL PROTEIN: 6.6 g/dL (ref 6.1–8.1)

## 2016-03-15 LAB — CBC WITH DIFFERENTIAL/PLATELET
BASOS PCT: 1 %
Basophils Absolute: 69 cells/uL (ref 0–200)
EOS ABS: 207 {cells}/uL (ref 15–500)
EOS PCT: 3 %
HCT: 46.3 % — ABNORMAL HIGH (ref 35.0–45.0)
Hemoglobin: 15.5 g/dL — ABNORMAL HIGH (ref 12.0–15.0)
LYMPHS PCT: 29 %
Lymphs Abs: 2001 cells/uL (ref 850–3900)
MCH: 28.4 pg (ref 27.0–33.0)
MCHC: 33.5 g/dL (ref 32.0–36.0)
MCV: 84.8 fL (ref 80.0–100.0)
MONOS PCT: 6 %
MPV: 11.2 fL (ref 7.5–12.5)
Monocytes Absolute: 414 cells/uL (ref 200–950)
NEUTROS ABS: 4209 {cells}/uL (ref 1500–7800)
Neutrophils Relative %: 61 %
PLATELETS: 179 10*3/uL (ref 140–400)
RBC: 5.46 MIL/uL — ABNORMAL HIGH (ref 3.80–5.10)
RDW: 13.4 % (ref 11.0–15.0)
WBC: 6.9 10*3/uL (ref 3.8–10.8)

## 2016-03-15 LAB — LIPID PANEL
CHOL/HDL RATIO: 3 ratio (ref <=5.0)
Cholesterol: 215 mg/dL — ABNORMAL HIGH (ref 125–200)
HDL: 71 mg/dL (ref >=46)
LDL CALC: 131 mg/dL — AB (ref <130)
Triglycerides: 65 mg/dL (ref <150)
VLDL: 13 mg/dL (ref <30)

## 2016-03-15 LAB — TSH: TSH: 1.37 mIU/L

## 2016-03-16 LAB — VITAMIN D 25 HYDROXY (VIT D DEFICIENCY, FRACTURES): VIT D 25 HYDROXY: 23 ng/mL — AB (ref 30–100)

## 2016-03-17 ENCOUNTER — Ambulatory Visit (INDEPENDENT_AMBULATORY_CARE_PROVIDER_SITE_OTHER): Payer: Commercial Managed Care - HMO | Admitting: Family Medicine

## 2016-03-17 ENCOUNTER — Encounter: Payer: Self-pay | Admitting: Family Medicine

## 2016-03-17 VITALS — BP 140/92 | HR 86 | Temp 98.9°F | Resp 18 | Ht 63.0 in | Wt 171.0 lb

## 2016-03-17 DIAGNOSIS — I10 Essential (primary) hypertension: Secondary | ICD-10-CM | POA: Diagnosis not present

## 2016-03-17 DIAGNOSIS — Z0000 Encounter for general adult medical examination without abnormal findings: Secondary | ICD-10-CM | POA: Diagnosis not present

## 2016-03-17 DIAGNOSIS — E669 Obesity, unspecified: Secondary | ICD-10-CM

## 2016-03-17 DIAGNOSIS — Z780 Asymptomatic menopausal state: Secondary | ICD-10-CM

## 2016-03-17 MED ORDER — TOPIRAMATE 25 MG PO TABS: 25.0000 mg | ORAL_TABLET | Freq: Two times a day (BID) | ORAL | Status: DC

## 2016-03-17 MED ORDER — LOSARTAN POTASSIUM 50 MG PO TABS: 50.0000 mg | ORAL_TABLET | Freq: Every day | ORAL | Status: DC

## 2016-03-17 NOTE — Progress Notes (Signed)
Subjective:    Patient ID: Kristina Dougherty, female    DOB: [birth year], 70 y.o.   MRN: [id number]  HPI patient is here today for a physical exam. Pneumovax 23 and Prevnar 13 are up-to-date. She states she had a tetanus shot within last 10 years. We discussed the shingles vaccine and she would like to check on the price before receiving it. She is overdue for mammogram, colonoscopy, and a bone density test. She refuses the colonoscopy. She refuses a mammogram. She will consent to a bone density test. She declines hepatitis C screening. She would like assistance with weight loss. She has arthritic pains in her joints and she believes that she can lose some weight that the pain will improve. We discussed at length her options and ultimately she would like to try Topamax Past Medical History  Diagnosis Date  . Anxiety   . Depression   . Hypertension   . Arthritis    Past Surgical History  Procedure Laterality Date  . Appendectomy    . Abdominal hysterectomy     Current Outpatient Prescriptions on File Prior to Visit  Medication Sig Dispense Refill  . amLODipine (NORVASC) 10 MG tablet Take 1 tablet (10 mg total) by mouth daily. 30 tablet 11  . aspirin 81 MG tablet Take 81 mg by mouth daily.      . clobetasol cream (TEMOVATE) AB-123456789 % Apply 1 application topically 2 (two) times daily. 30 g 3  . Estradiol 10 MCG TABS vaginal tablet Use intravaginally twice a week 8 tablet 11  . meloxicam (MOBIC) 15 MG tablet TAKE 1 TABLET EVERY DAY 90 tablet 0  . Multiple Vitamin (MULITIVITAMIN WITH MINERALS) TABS Take 1 tablet by mouth daily.    Marland Kitchen PARoxetine (PAXIL) 20 MG tablet TAKE 1 TABLET TWICE DAILY 180 tablet 0  . hydrochlorothiazide (MICROZIDE) 12.5 MG capsule Take 1 capsule (12.5 mg total) by mouth daily. (Patient not taking: Reported on 03/17/2016) 30 capsule 3   No current facility-administered medications on file prior to visit.   Allergies  Allergen Reactions  . Morphine And Related     Severe  HA per pt  . Nitrofurantoin Nausea And Vomiting   Social History   Social History  . Marital Status: Divorced    Spouse Name: N/A  . Number of Children: N/A  . Years of Education: N/A   Occupational History  . Not on file.   Social History Main Topics  . Smoking status: Current Every Day Smoker -- 0.30 packs/day    Types: Cigarettes    Last Attempt to Quit: 08/28/2011  . Smokeless tobacco: Never Used  . Alcohol Use: No  . Drug Use: No  . Sexual Activity: No   Other Topics Concern  . Not on file   Social History Narrative   Family History  Problem Relation Age of Onset  . Hypertension Maternal Grandmother      Review of Systems  All other systems reviewed and are negative.      Objective:   Physical Exam  Constitutional: She is oriented to person, place, and time. She appears well-developed and well-nourished. No distress.  HENT:  Head: Normocephalic and atraumatic.  Right Ear: External ear normal.  Left Ear: External ear normal.  Nose: Nose normal.  Mouth/Throat: Oropharynx is clear and moist. No oropharyngeal exudate.  Eyes: Conjunctivae and EOM are normal. Pupils are equal, round, and reactive to light. Right eye exhibits no discharge. Left eye exhibits no discharge. No scleral icterus.  Neck: Normal range of motion. Neck supple. No JVD present. No tracheal deviation present. No thyromegaly present.  Cardiovascular: Normal rate, regular rhythm, normal heart sounds and intact distal pulses.  Exam reveals no gallop and no friction rub.   No murmur heard. Pulmonary/Chest: Effort normal and breath sounds normal. No stridor. No respiratory distress. She has no wheezes. She has no rales. She exhibits no tenderness.  Abdominal: Soft. Bowel sounds are normal. She exhibits no distension and no mass. There is no tenderness. There is no rebound and no guarding.  Musculoskeletal: Normal range of motion. She exhibits no edema or tenderness.  Lymphadenopathy:    She has no  cervical adenopathy.  Neurological: She is alert and oriented to person, place, and time. She has normal reflexes. She displays normal reflexes. No cranial nerve deficit. She exhibits normal muscle tone. Coordination normal.  Skin: Skin is warm. No rash noted. She is not diaphoretic. No erythema. No pallor.  Psychiatric: She has a normal mood and affect. Her behavior is normal. Judgment and thought content normal.  Vitals reviewed.         Assessment & Plan:  Benign essential HTN - Plan: losartan (COZAAR) 50 MG tablet  Routine general medical examination at a health care facility  Obesity  Pressures elevated, I will add losartan 50 mg by mouth daily and recheck blood pressure in a month. The remainder of her lab work is excellent. Strongly recommended a mammogram and a colonoscopy but she refused. I will schedule the patient for a bone density test. We will try Topamax 25 mg once a day for weight loss and appetite suppression. Increase to 1 pill twice a day after 2 weeks assuming she is not having any side effects.

## 2016-03-23 ENCOUNTER — Other Ambulatory Visit: Payer: Self-pay | Admitting: Family Medicine

## 2016-03-23 DIAGNOSIS — Z1231 Encounter for screening mammogram for malignant neoplasm of breast: Secondary | ICD-10-CM

## 2016-04-06 ENCOUNTER — Other Ambulatory Visit: Payer: Self-pay | Admitting: Family Medicine

## 2016-04-06 DIAGNOSIS — E2839 Other primary ovarian failure: Secondary | ICD-10-CM

## 2016-04-11 ENCOUNTER — Other Ambulatory Visit: Payer: Medicare Other

## 2016-04-11 ENCOUNTER — Encounter: Payer: Self-pay | Admitting: Family Medicine

## 2016-04-11 ENCOUNTER — Ambulatory Visit
Admission: RE | Admit: 2016-04-11 | Discharge: 2016-04-11 | Disposition: A | Discharge status: Home / Self Care | Payer: Commercial Managed Care - HMO | Source: Ambulatory Visit | Attending: Family Medicine | Admitting: Family Medicine

## 2016-04-11 ENCOUNTER — Other Ambulatory Visit: Payer: Self-pay | Admitting: Family Medicine

## 2016-04-11 DIAGNOSIS — M8589 Other specified disorders of bone density and structure, multiple sites: Secondary | ICD-10-CM | POA: Diagnosis not present

## 2016-04-11 DIAGNOSIS — E2839 Other primary ovarian failure: Secondary | ICD-10-CM

## 2016-04-11 DIAGNOSIS — Z1231 Encounter for screening mammogram for malignant neoplasm of breast: Secondary | ICD-10-CM

## 2016-04-11 DIAGNOSIS — Z780 Asymptomatic menopausal state: Secondary | ICD-10-CM | POA: Diagnosis not present

## 2016-04-11 NOTE — Telephone Encounter (Signed)
Refill appropriate and filled per protocol. 

## 2016-04-12 ENCOUNTER — Other Ambulatory Visit: Payer: Self-pay | Admitting: *Deleted

## 2016-04-12 MED ORDER — ALENDRONATE SODIUM 70 MG PO TABS: 70.0000 mg | ORAL_TABLET | ORAL | Status: DC

## 2016-05-05 ENCOUNTER — Ambulatory Visit (INDEPENDENT_AMBULATORY_CARE_PROVIDER_SITE_OTHER): Payer: Commercial Managed Care - HMO | Admitting: Family Medicine

## 2016-05-05 ENCOUNTER — Encounter: Payer: Self-pay | Admitting: Family Medicine

## 2016-05-05 VITALS — BP 140/82 | HR 86 | Temp 99.3°F | Resp 18 | Ht 63.0 in | Wt 169.0 lb

## 2016-05-05 DIAGNOSIS — N390 Urinary tract infection, site not specified: Secondary | ICD-10-CM | POA: Diagnosis not present

## 2016-05-05 DIAGNOSIS — R300 Dysuria: Secondary | ICD-10-CM | POA: Diagnosis not present

## 2016-05-05 LAB — URINALYSIS, ROUTINE W REFLEX MICROSCOPIC
Bilirubin Urine: NEGATIVE
GLUCOSE, UA: NEGATIVE
Ketones, ur: NEGATIVE
Nitrite: NEGATIVE
PH: 5.5 (ref 5.0–8.0)
PROTEIN: NEGATIVE
Specific Gravity, Urine: 1.005 (ref 1.001–1.035)

## 2016-05-05 LAB — URINALYSIS, MICROSCOPIC ONLY
CRYSTALS: NONE SEEN [HPF]
Casts: NONE SEEN [LPF]
YEAST: NONE SEEN [HPF]

## 2016-05-05 MED ORDER — CIPROFLOXACIN HCL 500 MG PO TABS: 500.0000 mg | ORAL_TABLET | Freq: Two times a day (BID) | ORAL | Status: DC

## 2016-05-05 NOTE — Progress Notes (Signed)
Patient ID: Kristina Dougherty, female   DOB: 06-30-1946, 70 y.o.   MRN: [id number]    Subjective:    Patient ID: Kristina Dougherty, female    DOB: Mar 04, 1946, 70 y.o.   MRN: [id number]  Patient presents for Dysuria Here with burning with urination for the past 4 days. Her last UTI was a year ago at that time she had Escherichia coli. She denies any blood in the urine denies any change in her bowels no fever no back pain.     Review Of Systems:  GEN- denies fatigue, fever, weight loss,weakness, recent illness HEENT- denies eye drainage, change in vision, nasal discharge, CVS- denies chest pain, palpitations RESP- denies SOB, cough, wheeze ABD- denies N/V, change in stools, abd pain GU-+dysuria, hematuria, dribbling, incontinence Neuro- denies headache, dizziness, syncope, seizure activity       Objective:    BP 140/82 mmHg  Pulse 86  Temp(Src) 99.3 F (37.4 C) (Oral)  Resp 18  Ht 5\' 3"  (1.6 m)  Wt 169 lb (76.658 kg)  BMI 29.94 kg/m2 GEN- NAD, alert and oriented x3  CVS- RRR, no murmur RESP-CTAB ABD-NABS,soft,NT,ND, no CVA tenderness  EXT- No edema Pulses- Radial 2+        Assessment & Plan:      Problem List Items Addressed This Visit    None    Visit Diagnoses    Dysuria    -  Primary    Relevant Orders    Urinalysis, Routine w reflex microscopic (not at [facility])       Note: This dictation was prepared with Dragon dictation along with smaller phrase technology. Any transcriptional errors that result from this process are unintentional.

## 2016-05-05 NOTE — Patient Instructions (Signed)
Take antibiotics Stop AZO after 1 more day F/U as needed

## 2016-05-07 LAB — URINE CULTURE
Colony Count: NO GROWTH
ORGANISM ID, BACTERIA: NO GROWTH

## 2016-05-22 ENCOUNTER — Telehealth: Payer: Self-pay | Admitting: Family Medicine

## 2016-05-22 MED ORDER — PAROXETINE HCL 20 MG PO TABS: 20.0000 mg | ORAL_TABLET | Freq: Two times a day (BID) | ORAL | Status: DC

## 2016-05-22 NOTE — Telephone Encounter (Signed)
Medication called/sent to requested pharmacy  

## 2016-05-22 NOTE — Telephone Encounter (Signed)
Patient called requesting refill on her PARoxetine (PAXIL) 20 MG tablet   She uses [facility]  CB# [phone]

## 2016-07-24 ENCOUNTER — Other Ambulatory Visit: Payer: Self-pay | Admitting: Family Medicine

## 2016-07-26 ENCOUNTER — Other Ambulatory Visit: Payer: Self-pay | Admitting: *Deleted

## 2016-07-26 MED ORDER — CLOBETASOL PROPIONATE 0.05 % EX CREA: TOPICAL_CREAM | CUTANEOUS | 3 refills | Status: DC

## 2016-09-21 ENCOUNTER — Other Ambulatory Visit: Payer: Self-pay | Admitting: Family Medicine

## 2016-11-06 ENCOUNTER — Encounter: Payer: Self-pay | Admitting: Physician Assistant

## 2016-11-06 ENCOUNTER — Ambulatory Visit (INDEPENDENT_AMBULATORY_CARE_PROVIDER_SITE_OTHER): Payer: Commercial Managed Care - HMO | Admitting: Physician Assistant

## 2016-11-06 VITALS — BP 186/100 | HR 66 | Temp 98.3°F | Resp 18 | Wt 168.0 lb

## 2016-11-06 DIAGNOSIS — I10 Essential (primary) hypertension: Secondary | ICD-10-CM

## 2016-11-06 MED ORDER — AMLODIPINE BESYLATE 10 MG PO TABS: 10.0000 mg | ORAL_TABLET | Freq: Every day | ORAL | 0 refills | Status: DC

## 2016-11-06 MED ORDER — HYDROCHLOROTHIAZIDE 12.5 MG PO CAPS: 12.5000 mg | ORAL_CAPSULE | Freq: Every day | ORAL | 0 refills | Status: DC

## 2016-11-06 MED ORDER — LOSARTAN POTASSIUM 50 MG PO TABS: 50.0000 mg | ORAL_TABLET | Freq: Every day | ORAL | 0 refills | Status: DC

## 2016-11-06 NOTE — Progress Notes (Signed)
Patient ID: Kristina Dougherty MRN: [id number], DOB: [birth year], 70 y.o. Date of Encounter: 11/06/2016, 12:44 PM    Chief Complaint:  Chief Complaint  Patient presents with  . Hypertension    high     HPI: 70 y.o. year old female presents with above. Says that she had not been feeling good over the last week. Then this morning she woke up feeling a pulsation in her ear. Went to the pharmacy and checked her blood pressure and it was reading 201/96. She went home and took 2 of her losartan and scheduled this appointment. Nurse noted to me that when she was reviewing med list with patient-- patient was not familiar with the amlodipine/Norvasc and HCTZ and didn't seem to know these words and did not think she was on those medicines.  Once I was in the room talking with patient and reviewed the above I then reviewed her visit note with Dr. Dennard Schaumann 03/17/16. At that visit the amlodipine and HCTZ were already on the med list and at that visit he added the losartan.  I reviewed her refill history in the chart. Losartan was filled 03/17/16.  Last fill date for the Norvasc was 03/10/15 and for the HCTZ 03/15/15.  She is having no chest discomfort including pressure heaviness tightness. No shortness of breath. No weakness in any extremity or other stroke symptoms.     Home Meds:   Outpatient Medications Prior to Visit  Medication Sig Dispense Refill  . alendronate (FOSAMAX) 70 MG tablet Take 1 tablet (70 mg total) by mouth every 7 (seven) days. Take with a full glass of water on an empty stomach. 12 tablet 3  . aspirin 81 MG tablet Take 81 mg by mouth daily.      . clobetasol cream (TEMOVATE) 8.93 % APPLY ONE APPLICATION TOPICALLY TWO TIMES DAILY 30 g 3  . meloxicam (MOBIC) 15 MG tablet TAKE 1 TABLET EVERY DAY 90 tablet 0  . Multiple Vitamin (MULITIVITAMIN WITH MINERALS) TABS Take 1 tablet by mouth daily.    Marland Kitchen PARoxetine (PAXIL) 20 MG tablet Take 1 tablet (20 mg total) by mouth 2 (two) times  daily. 180 tablet 3  . losartan (COZAAR) 50 MG tablet Take 1 tablet (50 mg total) by mouth daily. 90 tablet 3  . ciprofloxacin (CIPRO) 500 MG tablet Take 1 tablet (500 mg total) by mouth 2 (two) times daily. (Patient not taking: Reported on 11/06/2016) 14 tablet 0  . Estradiol 10 MCG TABS vaginal tablet Use intravaginally twice a week (Patient not taking: Reported on 11/06/2016) 8 tablet 11  . topiramate (TOPAMAX) 25 MG tablet Take 1 tablet (25 mg total) by mouth 2 (two) times daily. (Patient not taking: Reported on 11/06/2016) 60 tablet 3  . amLODipine (NORVASC) 10 MG tablet Take 1 tablet (10 mg total) by mouth daily. (Patient not taking: Reported on 11/06/2016) 30 tablet 11  . hydrochlorothiazide (MICROZIDE) 12.5 MG capsule Take 1 capsule (12.5 mg total) by mouth daily. (Patient not taking: Reported on 11/06/2016) 30 capsule 3   No facility-administered medications prior to visit.     Allergies:  Allergies  Allergen Reactions  . Morphine And Related     Severe HA per pt  . Nitrofurantoin Nausea And Vomiting      Review of Systems: See HPI for pertinent ROS. All other ROS negative.    Physical Exam: Blood pressure (!) 186/100, pulse 66, temperature 98.3 F (36.8 C), temperature source Oral, resp. rate 18, weight 168 lb (  76.2 kg), SpO2 97 %., Body mass index is 29.76 kg/m. General: WNWD WF Appears in no acute distress. Neck: Supple. No thyromegaly. No lymphadenopathy. Lungs: Clear bilaterally to auscultation without wheezes, rales, or rhonchi. Breathing is unlabored. Heart: Regular rhythm. No murmurs, rubs, or gallops. Msk:  Strength and tone normal for age. Extremities/Skin: Warm and dry. Neuro: Alert and oriented X 3. Moves all extremities spontaneously. Gait is normal. CNII-XII grossly in tact. Psych:  Responds to questions appropriately with a normal affect.     ASSESSMENT AND PLAN:  70 y.o. year old female with   Essential hypertension She is to resume the Norvasc 10 mg  daily and HCTZ 12.5 mg daily. She is to continue the losartan 50 mg daily. She is to schedule follow-up office visit here in 2 weeks to recheck blood pressure and be met on medications. Today I have sent a 30 day supply of all medicines to her local pharmacy. At follow-up visit will send  90 day supply to her mail pharmacy.  - amLODipine (NORVASC) 10 MG tablet; Take 1 tablet (10 mg total) by mouth daily.  Dispense: 30 tablet; Refill: 0 - hydrochlorothiazide (MICROZIDE) 12.5 MG capsule; Take 1 capsule (12.5 mg total) by mouth daily.  Dispense: 30 capsule; Refill: 0 - losartan (COZAAR) 50 MG tablet; Take 1 tablet (50 mg total) by mouth daily.  Dispense: 30 tablet; Refill: 0   Signed, [address] [place], [place], [facility] 11/06/2016 12:44 PM

## 2016-11-22 ENCOUNTER — Encounter: Payer: Self-pay | Admitting: Family Medicine

## 2016-11-22 ENCOUNTER — Ambulatory Visit (INDEPENDENT_AMBULATORY_CARE_PROVIDER_SITE_OTHER): Payer: Commercial Managed Care - HMO | Admitting: Family Medicine

## 2016-11-22 VITALS — BP 136/90 | HR 76 | Temp 99.0°F | Resp 18 | Ht 63.0 in | Wt 168.0 lb

## 2016-11-22 DIAGNOSIS — I10 Essential (primary) hypertension: Secondary | ICD-10-CM | POA: Diagnosis not present

## 2016-11-22 DIAGNOSIS — Z0000 Encounter for general adult medical examination without abnormal findings: Secondary | ICD-10-CM | POA: Diagnosis not present

## 2016-11-22 NOTE — Progress Notes (Signed)
Subjective:    Patient ID: Kristina Dougherty, female    DOB: Sep 10, 1946, 70 y.o.   MRN: [id number]  HPI  Patient is here today for a physical exam. Pneumovax 23 and Prevnar 13 are up-to-date. She also had the shingles vaccine last year.  She has had her flu shot this year. She states she had a tetanus shot within last 10 years.   Dexa preformed 5/17 revealed T score -2.1 at forearm.  Patient was started on Fosamax. We will need to repeat her bone density test between 2019 and 2020. She continues to refuse a colonoscopy. She also politely declines hepatitis C screening. Wt Readings from Last 3 Encounters:  11/22/16 168 lb (76.2 kg)  11/06/16 168 lb (76.2 kg)  05/05/16 169 lb (76.7 kg)    Was recently seen due to elevated blood pressure. At that time patient had discontinued her amlodipine and hydrochlorothiazide although we are not sure why. She was only taking her losartan. Since resuming the combination of losartan, hydrochlorothiazide, and amlodipine, her blood pressure is much better. Today it is well controlled. She denies any further chest pain or headaches. Past Medical History:  Diagnosis Date  . Anxiety   . Arthritis   . Depression   . Hypertension   . Osteopenia    Past Surgical History:  Procedure Laterality Date  . ABDOMINAL HYSTERECTOMY    . APPENDECTOMY     Current Outpatient Prescriptions on File Prior to Visit  Medication Sig Dispense Refill  . alendronate (FOSAMAX) 70 MG tablet Take 1 tablet (70 mg total) by mouth every 7 (seven) days. Take with a full glass of water on an empty stomach. 12 tablet 3  . amLODipine (NORVASC) 10 MG tablet Take 1 tablet (10 mg total) by mouth daily. 30 tablet 0  . aspirin 81 MG tablet Take 81 mg by mouth daily.      . clobetasol cream (TEMOVATE) AB-123456789 % APPLY ONE APPLICATION TOPICALLY TWO TIMES DAILY 30 g 3  . hydrochlorothiazide (MICROZIDE) 12.5 MG capsule Take 1 capsule (12.5 mg total) by mouth daily. 30 capsule 0  . losartan (COZAAR)  50 MG tablet Take 1 tablet (50 mg total) by mouth daily. 30 tablet 0  . meloxicam (MOBIC) 15 MG tablet TAKE 1 TABLET EVERY DAY 90 tablet 0  . Multiple Vitamin (MULITIVITAMIN WITH MINERALS) TABS Take 1 tablet by mouth daily.    Marland Kitchen PARoxetine (PAXIL) 20 MG tablet Take 1 tablet (20 mg total) by mouth 2 (two) times daily. 180 tablet 3   No current facility-administered medications on file prior to visit.    Allergies  Allergen Reactions  . Morphine And Related     Severe HA per pt  . Nitrofurantoin Nausea And Vomiting   Social History   Social History  . Marital status: Divorced    Spouse name: N/A  . Number of children: N/A  . Years of education: N/A   Occupational History  . Not on file.   Social History Main Topics  . Smoking status: Current Every Day Smoker    Packs/day: 0.30    Types: Cigarettes    Last attempt to quit: 08/28/2011  . Smokeless tobacco: Never Used  . Alcohol use No  . Drug use: No  . Sexual activity: No   Other Topics Concern  . Not on file   Social History Narrative  . No narrative on file   Family History  Problem Relation Age of Onset  . Hypertension Maternal Grandmother  Review of Systems  All other systems reviewed and are negative.      Objective:   Physical Exam  Constitutional: She is oriented to person, place, and time. She appears well-developed and well-nourished. No distress.  HENT:  Head: Normocephalic and atraumatic.  Right Ear: External ear normal.  Left Ear: External ear normal.  Nose: Nose normal.  Mouth/Throat: Oropharynx is clear and moist. No oropharyngeal exudate.  Eyes: Conjunctivae and EOM are normal. Pupils are equal, round, and reactive to light. Right eye exhibits no discharge. Left eye exhibits no discharge. No scleral icterus.  Neck: Normal range of motion. Neck supple. No JVD present. No tracheal deviation present. No thyromegaly present.  Cardiovascular: Normal rate, regular rhythm, normal heart sounds and  intact distal pulses.  Exam reveals no gallop and no friction rub.   No murmur heard. Pulmonary/Chest: Effort normal and breath sounds normal. No stridor. No respiratory distress. She has no wheezes. She has no rales. She exhibits no tenderness.  Abdominal: Soft. Bowel sounds are normal. She exhibits no distension and no mass. There is no tenderness. There is no rebound and no guarding.  Musculoskeletal: Normal range of motion. She exhibits no edema or tenderness.  Lymphadenopathy:    She has no cervical adenopathy.  Neurological: She is alert and oriented to person, place, and time. She has normal reflexes. No cranial nerve deficit. She exhibits normal muscle tone. Coordination normal.  Skin: Skin is warm. No rash noted. She is not diaphoretic. No erythema. No pallor.  Psychiatric: She has a normal mood and affect. Her behavior is normal. Judgment and thought content normal.  Vitals reviewed.         Assessment & Plan:  Essential hypertension - Plan: CBC with Differential/Platelet, COMPLETE METABOLIC PANEL WITH GFR, Lipid panel  Routine general medical examination at a health care facility  Blood pressure is now controlled. Make no change in her combination of amlodipine, hydrochlorothiazide, and losartan. Continue Fosamax. Repeat bone density in 2019/2020. Mammogram is up-to-date. Pap smear is not necessary. Patient politely declines hepatitis C screening as well as a colonoscopy. She did have a fecal occult blood card test one time this year. She declines cologuard.

## 2016-11-23 LAB — CBC WITH DIFFERENTIAL/PLATELET
BASOS ABS: 79 {cells}/uL (ref 0–200)
Basophils Relative: 1 %
EOS PCT: 2 %
Eosinophils Absolute: 158 cells/uL (ref 15–500)
HCT: 44.7 % (ref 35.0–45.0)
HEMOGLOBIN: 15.2 g/dL — AB (ref 12.0–15.0)
LYMPHS ABS: 2686 {cells}/uL (ref 850–3900)
Lymphocytes Relative: 34 %
MCH: 28.8 pg (ref 27.0–33.0)
MCHC: 34 g/dL (ref 32.0–36.0)
MCV: 84.8 fL (ref 80.0–100.0)
MONOS PCT: 7 %
MPV: 10.6 fL (ref 7.5–12.5)
Monocytes Absolute: 553 cells/uL (ref 200–950)
NEUTROS PCT: 56 %
Neutro Abs: 4424 cells/uL (ref 1500–7800)
PLATELETS: 195 10*3/uL (ref 140–400)
RBC: 5.27 MIL/uL — AB (ref 3.80–5.10)
RDW: 13.3 % (ref 11.0–15.0)
WBC: 7.9 10*3/uL (ref 3.8–10.8)

## 2016-11-23 LAB — COMPLETE METABOLIC PANEL WITH GFR
ALBUMIN: 4.2 g/dL (ref 3.6–5.1)
ALK PHOS: 61 U/L (ref 33–130)
ALT: 25 U/L (ref 6–29)
AST: 29 U/L (ref 10–35)
BUN: 21 mg/dL (ref 7–25)
CHLORIDE: 101 mmol/L (ref 98–110)
CO2: 27 mmol/L (ref 20–31)
Calcium: 9.3 mg/dL (ref 8.6–10.4)
Creat: 0.88 mg/dL (ref 0.60–0.93)
GFR, EST NON AFRICAN AMERICAN: 66 mL/min (ref >=60)
GFR, Est African American: 76 mL/min (ref >=60)
GLUCOSE: 79 mg/dL (ref 70–99)
POTASSIUM: 4.1 mmol/L (ref 3.5–5.3)
SODIUM: 138 mmol/L (ref 135–146)
Total Bilirubin: 0.5 mg/dL (ref 0.2–1.2)
Total Protein: 6.6 g/dL (ref 6.1–8.1)

## 2016-11-23 LAB — LIPID PANEL
CHOL/HDL RATIO: 3.3 ratio (ref <5.0)
Cholesterol: 237 mg/dL — ABNORMAL HIGH (ref <200)
HDL: 71 mg/dL (ref >50)
LDL CALC: 148 mg/dL — AB (ref <100)
Triglycerides: 90 mg/dL (ref <150)
VLDL: 18 mg/dL (ref <30)

## 2016-11-29 ENCOUNTER — Telehealth: Payer: Self-pay | Admitting: Family Medicine

## 2016-11-29 DIAGNOSIS — I10 Essential (primary) hypertension: Secondary | ICD-10-CM

## 2016-11-29 NOTE — Telephone Encounter (Signed)
Patient would like to speak to you only, would not let me know reasons why  [phone]

## 2016-11-30 MED ORDER — AMLODIPINE BESYLATE 10 MG PO TABS: 10.0000 mg | ORAL_TABLET | Freq: Every day | ORAL | 3 refills | Status: DC

## 2016-11-30 MED ORDER — HYDROCHLOROTHIAZIDE 12.5 MG PO CAPS: 12.5000 mg | ORAL_CAPSULE | Freq: Every day | ORAL | 3 refills | Status: DC

## 2016-11-30 NOTE — Telephone Encounter (Signed)
Pt requesting refill of the 2 new BP med to be sent to [facility] as she thought when she saw MBD that this was already done but it was not. Medication called/sent to requested pharmacy.

## 2016-12-22 DIAGNOSIS — H524 Presbyopia: Secondary | ICD-10-CM | POA: Diagnosis not present

## 2016-12-28 ENCOUNTER — Telehealth: Payer: Self-pay | Admitting: *Deleted

## 2016-12-28 MED ORDER — PAROXETINE HCL 20 MG PO TABS: 20.0000 mg | ORAL_TABLET | Freq: Two times a day (BID) | ORAL | 3 refills | Status: DC

## 2016-12-28 NOTE — Telephone Encounter (Signed)
Received PA determination.   PA Approved 12/28/2016- 06/27/2017.  [id number].  Pharmacy made aware.

## 2016-12-28 NOTE — Telephone Encounter (Signed)
Received request from pharmacy for PA on Paxil.   PA submitted.   Dx: Matti.Burr- depression/ F41- anxiety  The request has received a Pending outcome. Please note any additional information provided by [facility] at the bottom of your screen. You will receive a final determination electronically in CoverMyMeds and via email and fax within 24 to 72 hours.

## 2017-01-09 DIAGNOSIS — H25812 Combined forms of age-related cataract, left eye: Secondary | ICD-10-CM | POA: Diagnosis not present

## 2017-01-09 DIAGNOSIS — H43812 Vitreous degeneration, left eye: Secondary | ICD-10-CM | POA: Diagnosis not present

## 2017-01-09 DIAGNOSIS — H25811 Combined forms of age-related cataract, right eye: Secondary | ICD-10-CM | POA: Diagnosis not present

## 2017-01-09 DIAGNOSIS — H25813 Combined forms of age-related cataract, bilateral: Secondary | ICD-10-CM | POA: Diagnosis not present

## 2017-01-09 DIAGNOSIS — D3131 Benign neoplasm of right choroid: Secondary | ICD-10-CM | POA: Diagnosis not present

## 2017-01-18 ENCOUNTER — Encounter: Payer: Self-pay | Admitting: Physician Assistant

## 2017-01-18 ENCOUNTER — Ambulatory Visit (INDEPENDENT_AMBULATORY_CARE_PROVIDER_SITE_OTHER): Payer: Medicare HMO | Admitting: Physician Assistant

## 2017-01-18 VITALS — BP 138/86 | HR 86 | Temp 99.1°F | Resp 18 | Ht 63.0 in | Wt 173.0 lb

## 2017-01-18 DIAGNOSIS — J441 Chronic obstructive pulmonary disease with (acute) exacerbation: Secondary | ICD-10-CM | POA: Diagnosis not present

## 2017-01-18 DIAGNOSIS — B9689 Other specified bacterial agents as the cause of diseases classified elsewhere: Principal | ICD-10-CM

## 2017-01-18 DIAGNOSIS — J988 Other specified respiratory disorders: Secondary | ICD-10-CM

## 2017-01-18 MED ORDER — ALBUTEROL SULFATE HFA 108 (90 BASE) MCG/ACT IN AERS: 2.0000 | INHALATION_SPRAY | Freq: Four times a day (QID) | RESPIRATORY_TRACT | 2 refills | Status: DC | PRN

## 2017-01-18 MED ORDER — AZITHROMYCIN 250 MG PO TABS: ORAL_TABLET | ORAL | 0 refills | Status: DC

## 2017-01-18 MED ORDER — PREDNISONE 20 MG PO TABS: 20.0000 mg | ORAL_TABLET | Freq: Every day | ORAL | 0 refills | Status: DC

## 2017-01-18 NOTE — Progress Notes (Signed)
Patient ID: Kristina Dougherty MRN: [id number], DOB: [birth year], 71 y.o. Date of Encounter: 01/18/2017, 12:53 PM    Chief Complaint:  Chief Complaint  Patient presents with  . Illness    x1 week- fever that has resolved, SOB, wheezing, productive cough with clear to yellow mucus     HPI: 71 y.o. year old female presents with above.   She reports all the above symptoms. Reports above history. She really has not had any congestion in her head or nose. Says it is all in her chest. Has had a lot of coughing. Cannot stop coughing at night.  Breathing was okay until just the past 2 days-- that has been a little worse past 2 days. Reviewed her med list and see that she has no inhaler and she verifies that she does not have any type of inhaler at home to use. She is a smoker. No additional concerns. Other symptoms as above.     Home Meds:   Outpatient Medications Prior to Visit  Medication Sig Dispense Refill  . alendronate (FOSAMAX) 70 MG tablet Take 1 tablet (70 mg total) by mouth every 7 (seven) days. Take with a full glass of water on an empty stomach. 12 tablet 3  . amLODipine (NORVASC) 10 MG tablet Take 1 tablet (10 mg total) by mouth daily. 90 tablet 3  . aspirin 81 MG tablet Take 81 mg by mouth daily.      . clobetasol cream (TEMOVATE) AB-123456789 % APPLY ONE APPLICATION TOPICALLY TWO TIMES DAILY 30 g 3  . hydrochlorothiazide (MICROZIDE) 12.5 MG capsule Take 1 capsule (12.5 mg total) by mouth daily. 90 capsule 3  . losartan (COZAAR) 50 MG tablet Take 1 tablet (50 mg total) by mouth daily. 30 tablet 0  . meloxicam (MOBIC) 15 MG tablet TAKE 1 TABLET EVERY DAY 90 tablet 0  . Multiple Vitamin (MULITIVITAMIN WITH MINERALS) TABS Take 1 tablet by mouth daily.    Marland Kitchen PARoxetine (PAXIL) 20 MG tablet Take 1 tablet (20 mg total) by mouth 2 (two) times daily. 180 tablet 3   No facility-administered medications prior to visit.     Allergies:  Allergies  Allergen Reactions  . Morphine And Related      Severe HA per pt  . Nitrofurantoin Nausea And Vomiting      Review of Systems: See HPI for pertinent ROS. All other ROS negative.    Physical Exam: Blood pressure 138/86, pulse 86, temperature 99.1 F (37.3 C), temperature source Oral, resp. rate 18, height 5\' 3"  (1.6 m), weight 173 lb (78.5 kg), SpO2 95 %., Body mass index is 30.65 kg/m. General:  WNWD WF. Appears in no acute distress. HEENT: Normocephalic, atraumatic, eyes without discharge, sclera non-icteric, nares are without discharge. Bilateral auditory canals clear, TM's are without perforation, pearly grey and translucent with reflective cone of light bilaterally. Oral cavity moist, posterior pharynx without exudate, erythema, peritonsillar abscess.  Neck: Supple. No thyromegaly. No lymphadenopathy. Lungs: Clear bilaterally to auscultation without wheezes, rales, or rhonchi. Breathing is unlabored. Heart: Regular rhythm. No murmurs, rubs, or gallops. Msk:  Strength and tone normal for age. Extremities/Skin: Warm and dry.  Neuro: Alert and oriented X 3. Moves all extremities spontaneously. Gait is normal. CNII-XII grossly in tact. Psych:  Responds to questions appropriately with a normal affect.     ASSESSMENT AND PLAN:  71 y.o. year old female with  1. Bacterial respiratory infection - azithromycin (ZITHROMAX) 250 MG tablet; Day 1: Take 2 daily. Days  2 -5: Take 1 daily.  Dispense: 6 tablet; Refill: 0  2. COPD exacerbation ([place]) Has no wheezing on exam at this time but she reports feeling some shortness of breath and mild wheezing --so will use low-dose prednisone and will give her albuterol inhaler to have available to use. She is to take the antibiotic and prednisone as directed and can use the albuterol if needed. follow up if symptoms worsen or if symptoms do not resolve within 1 week after completion of antibiotic. - azithromycin (ZITHROMAX) 250 MG tablet; Day 1: Take 2 daily. Days 2 -5: Take 1 daily.  Dispense: 6  tablet; Refill: 0 - albuterol (PROVENTIL HFA;VENTOLIN HFA) 108 (90 Base) MCG/ACT inhaler; Inhale 2 puffs into the lungs every 6 (six) hours as needed for wheezing or shortness of breath.  Dispense: 1 Inhaler; Refill: 2 - predniSONE (DELTASONE) 20 MG tablet; Take 1 tablet (20 mg total) by mouth daily with breakfast.  Dispense: 5 tablet; Refill: 0   Signed, [address] [place], [place], [facility] 01/18/2017 12:53 PM

## 2017-01-22 DIAGNOSIS — H2512 Age-related nuclear cataract, left eye: Secondary | ICD-10-CM | POA: Diagnosis not present

## 2017-01-22 DIAGNOSIS — H25812 Combined forms of age-related cataract, left eye: Secondary | ICD-10-CM | POA: Diagnosis not present

## 2017-01-22 DIAGNOSIS — H2513 Age-related nuclear cataract, bilateral: Secondary | ICD-10-CM | POA: Diagnosis not present

## 2017-02-22 DIAGNOSIS — D3131 Benign neoplasm of right choroid: Secondary | ICD-10-CM | POA: Diagnosis not present

## 2017-02-28 ENCOUNTER — Other Ambulatory Visit: Payer: Self-pay | Admitting: Family Medicine

## 2017-02-28 DIAGNOSIS — I10 Essential (primary) hypertension: Secondary | ICD-10-CM

## 2017-02-28 MED ORDER — HYDROCHLOROTHIAZIDE 12.5 MG PO CAPS: 12.5000 mg | ORAL_CAPSULE | Freq: Every day | ORAL | 1 refills | Status: DC

## 2017-02-28 MED ORDER — MELOXICAM 15 MG PO TABS: 15.0000 mg | ORAL_TABLET | Freq: Every day | ORAL | 1 refills | Status: DC

## 2017-02-28 NOTE — Telephone Encounter (Signed)
Medication refilled per protocol. 

## 2017-03-05 DIAGNOSIS — H25811 Combined forms of age-related cataract, right eye: Secondary | ICD-10-CM | POA: Diagnosis not present

## 2017-03-05 DIAGNOSIS — H2513 Age-related nuclear cataract, bilateral: Secondary | ICD-10-CM | POA: Diagnosis not present

## 2017-03-12 ENCOUNTER — Other Ambulatory Visit: Payer: Self-pay | Admitting: Family Medicine

## 2017-04-03 ENCOUNTER — Other Ambulatory Visit: Payer: Self-pay | Admitting: Family Medicine

## 2017-04-03 DIAGNOSIS — I10 Essential (primary) hypertension: Secondary | ICD-10-CM

## 2017-04-11 ENCOUNTER — Encounter: Payer: Self-pay | Admitting: Gynecology

## 2017-05-28 ENCOUNTER — Ambulatory Visit: Payer: Medicare HMO | Admitting: Physician Assistant

## 2017-05-28 DIAGNOSIS — N390 Urinary tract infection, site not specified: Secondary | ICD-10-CM | POA: Diagnosis not present

## 2017-05-28 DIAGNOSIS — F419 Anxiety disorder, unspecified: Secondary | ICD-10-CM | POA: Diagnosis not present

## 2017-05-28 DIAGNOSIS — I10 Essential (primary) hypertension: Secondary | ICD-10-CM | POA: Diagnosis not present

## 2017-05-28 DIAGNOSIS — A499 Bacterial infection, unspecified: Secondary | ICD-10-CM | POA: Diagnosis not present

## 2017-05-29 ENCOUNTER — Ambulatory Visit: Payer: Medicare HMO | Admitting: Family Medicine

## 2017-06-08 DIAGNOSIS — A499 Bacterial infection, unspecified: Secondary | ICD-10-CM | POA: Diagnosis not present

## 2017-06-08 DIAGNOSIS — F419 Anxiety disorder, unspecified: Secondary | ICD-10-CM | POA: Diagnosis not present

## 2017-06-08 DIAGNOSIS — N390 Urinary tract infection, site not specified: Secondary | ICD-10-CM | POA: Diagnosis not present

## 2017-06-08 DIAGNOSIS — F329 Major depressive disorder, single episode, unspecified: Secondary | ICD-10-CM | POA: Diagnosis not present

## 2017-07-09 DIAGNOSIS — N952 Postmenopausal atrophic vaginitis: Secondary | ICD-10-CM | POA: Diagnosis not present

## 2017-07-09 DIAGNOSIS — L900 Lichen sclerosus et atrophicus: Secondary | ICD-10-CM | POA: Diagnosis not present

## 2017-07-09 DIAGNOSIS — N8110 Cystocele, unspecified: Secondary | ICD-10-CM | POA: Diagnosis not present

## 2017-07-09 DIAGNOSIS — Z1231 Encounter for screening mammogram for malignant neoplasm of breast: Secondary | ICD-10-CM | POA: Diagnosis not present

## 2017-07-09 DIAGNOSIS — R350 Frequency of micturition: Secondary | ICD-10-CM | POA: Diagnosis not present

## 2017-07-10 DIAGNOSIS — R350 Frequency of micturition: Secondary | ICD-10-CM | POA: Diagnosis not present

## 2017-07-19 DIAGNOSIS — N8111 Cystocele, midline: Secondary | ICD-10-CM | POA: Diagnosis not present

## 2017-07-19 DIAGNOSIS — R3915 Urgency of urination: Secondary | ICD-10-CM | POA: Diagnosis not present

## 2017-08-06 ENCOUNTER — Ambulatory Visit (INDEPENDENT_AMBULATORY_CARE_PROVIDER_SITE_OTHER): Payer: Medicare HMO | Admitting: Physician Assistant

## 2017-08-06 VITALS — BP 130/96 | HR 76 | Temp 98.3°F | Resp 18 | Ht 63.0 in | Wt 168.0 lb

## 2017-08-06 DIAGNOSIS — H65111 Acute and subacute allergic otitis media (mucoid) (sanguinous) (serous), right ear: Secondary | ICD-10-CM | POA: Diagnosis not present

## 2017-08-06 MED ORDER — AMOXICILLIN-POT CLAVULANATE 875-125 MG PO TABS: 1.0000 | ORAL_TABLET | Freq: Two times a day (BID) | ORAL | 0 refills | Status: DC

## 2017-08-06 NOTE — Progress Notes (Signed)
Patient ID: Kristina Dougherty MRN: [id number], DOB: Dec 25, 1945, 71 y.o. Date of Encounter: 08/06/2017, 10:45 AM    Chief Complaint:  Chief Complaint  Patient presents with  . Right ear pain     HPI: 71 y.o. year old female presents with above.   Originally she says that this has been going on for about 3 or 4 days. Says that she was driving and all of a sudden had a popping sensation in her right ear.  Then later when asked exactly what day it was that she was driving and this occurred ---she then said that it was last Monday which was exactly 1 week ago. When I asked more about the "popping" -- she says that "it felt like something on her eardrum and decreased hearing since then".  Says that she has seen an "ear specialist in the past and they told her it was a problem with her right ear draining properly." She thought there might be wax in her ear so she has been self treating with using a syringe peroxide and debrox but the symptoms have gotten no better. Says she has not been having any stuffiness or nasal congestion and has not been having runny nose or mucus from the nose no sore throat. No chest congestion or cough.     Home Meds:   Outpatient Medications Prior to Visit  Medication Sig Dispense Refill  . alendronate (FOSAMAX) 70 MG tablet TAKE 1 TABLET EVERY 7 DAYS. TAKE WITH A FULL GLASS OF WATER ON AN EMPTY STOMACH. 12 tablet 3  . amLODipine (NORVASC) 10 MG tablet Take 1 tablet (10 mg total) by mouth daily. 90 tablet 3  . aspirin 81 MG tablet Take 81 mg by mouth daily.      Marland Kitchen azithromycin (ZITHROMAX) 250 MG tablet Day 1: Take 2 daily. Days 2 -5: Take 1 daily. 6 tablet 0  . clobetasol cream (TEMOVATE) 9.21 % APPLY ONE APPLICATION TOPICALLY TWO TIMES DAILY 30 g 3  . hydrochlorothiazide (MICROZIDE) 12.5 MG capsule Take 1 capsule (12.5 mg total) by mouth daily. 90 capsule 1  . losartan (COZAAR) 50 MG tablet TAKE 1 TABLET (50 MG TOTAL) BY MOUTH DAILY. 90 tablet 3  .  meloxicam (MOBIC) 15 MG tablet Take 1 tablet (15 mg total) by mouth daily. 90 tablet 1  . Multiple Vitamin (MULITIVITAMIN WITH MINERALS) TABS Take 1 tablet by mouth daily.    Marland Kitchen PARoxetine (PAXIL) 20 MG tablet Take 1 tablet (20 mg total) by mouth 2 (two) times daily. 180 tablet 3  . albuterol (PROVENTIL HFA;VENTOLIN HFA) 108 (90 Base) MCG/ACT inhaler Inhale 2 puffs into the lungs every 6 (six) hours as needed for wheezing or shortness of breath. 1 Inhaler 2  . predniSONE (DELTASONE) 20 MG tablet Take 1 tablet (20 mg total) by mouth daily with breakfast. 5 tablet 0   No facility-administered medications prior to visit.     Allergies:  Allergies  Allergen Reactions  . Morphine And Related     Severe HA per pt  . Nitrofurantoin Nausea And Vomiting      Review of Systems: See HPI for pertinent ROS. All other ROS negative.    Physical Exam: Blood pressure (!) 130/96, pulse 76, temperature 98.3 F (36.8 C), temperature source Oral, resp. rate 18, height 5\' 3"  (1.6 m), weight 168 lb (76.2 kg), SpO2 98 %., Body mass index is 29.76 kg/m. General:  WNWD WF. Appears in no acute distress. HEENT: Normocephalic, atraumatic, eyes without  discharge, sclera non-icteric, nares are without discharge. Posterior pharynx with no erythema, no exudate. Left ear canal obstructed with cerumen Right ear can now is patent with no cerumen. There is an area of erythema on the wall of the ear canal (appears to be secondary to the irrigation she has been doing at home) Also the right tympanic membrane appears to be perforated. Also there is erythema of the tympanic membrane on the right. Neck: Supple. No thyromegaly. No lymphadenopathy. Lungs: Clear bilaterally to auscultation without wheezes, rales, or rhonchi. Breathing is unlabored. Heart: Regular rhythm. No murmurs, rubs, or gallops. Msk:  Strength and tone normal for age. Extremities/Skin: Warm and dry.  Neuro: Alert and oriented X 3. Moves all extremities  spontaneously. Gait is normal. CNII-XII grossly in tact. Psych:  Responds to questions appropriately with a normal affect.     ASSESSMENT AND PLAN:  71 y.o. year old female with  1. Acute mucoid otitis media of right ear She is to start antibiotic immediately to get in both doses for today, take as directed and complete all of it. If symptoms worsen in the interim, then follow-up immediately. She is to have follow-up visit here in 3 weeks to reexamine ear and make sure infection has resolved and make sure that the perforation has healed. If exam of the ear at follow-up visit is not normal, then will refer to ENT. - amoxicillin-clavulanate (AUGMENTIN) 875-125 MG tablet; Take 1 tablet by mouth 2 (two) times daily.  Dispense: 20 tablet; Refill: 0   Signed, [address] [place], [place], [facility] 08/06/2017 10:45 AM

## 2017-08-14 DIAGNOSIS — H903 Sensorineural hearing loss, bilateral: Secondary | ICD-10-CM | POA: Diagnosis not present

## 2017-08-14 DIAGNOSIS — H9121 Sudden idiopathic hearing loss, right ear: Secondary | ICD-10-CM | POA: Diagnosis not present

## 2017-08-14 DIAGNOSIS — H6122 Impacted cerumen, left ear: Secondary | ICD-10-CM | POA: Diagnosis not present

## 2017-08-16 ENCOUNTER — Other Ambulatory Visit: Payer: Self-pay | Admitting: Otolaryngology

## 2017-08-16 DIAGNOSIS — H9121 Sudden idiopathic hearing loss, right ear: Secondary | ICD-10-CM | POA: Diagnosis not present

## 2017-08-16 DIAGNOSIS — H6122 Impacted cerumen, left ear: Secondary | ICD-10-CM

## 2017-08-23 ENCOUNTER — Other Ambulatory Visit: Payer: Self-pay | Admitting: Family Medicine

## 2017-08-27 ENCOUNTER — Ambulatory Visit: Payer: Medicare HMO | Admitting: Family Medicine

## 2017-08-28 ENCOUNTER — Ambulatory Visit
Admission: RE | Admit: 2017-08-28 | Discharge: 2017-08-28 | Disposition: A | Discharge status: Home / Self Care | Payer: Commercial Managed Care - HMO | Source: Ambulatory Visit | Attending: Otolaryngology | Admitting: Otolaryngology

## 2017-08-28 DIAGNOSIS — H9191 Unspecified hearing loss, right ear: Secondary | ICD-10-CM | POA: Diagnosis not present

## 2017-08-28 DIAGNOSIS — H9121 Sudden idiopathic hearing loss, right ear: Secondary | ICD-10-CM

## 2017-08-28 DIAGNOSIS — H6122 Impacted cerumen, left ear: Secondary | ICD-10-CM

## 2017-08-28 MED ORDER — GADOBENATE DIMEGLUMINE 529 MG/ML IV SOLN
16.0000 mL | Freq: Once | INTRAVENOUS | Status: AC | PRN
  Administered 2017-08-28: 16 mL via INTRAVENOUS

## 2017-08-29 DIAGNOSIS — H9041 Sensorineural hearing loss, unilateral, right ear, with unrestricted hearing on the contralateral side: Secondary | ICD-10-CM | POA: Diagnosis not present

## 2017-08-29 DIAGNOSIS — H903 Sensorineural hearing loss, bilateral: Secondary | ICD-10-CM | POA: Diagnosis not present

## 2017-12-12 ENCOUNTER — Telehealth: Payer: Self-pay | Admitting: Family Medicine

## 2017-12-12 NOTE — Telephone Encounter (Signed)
PA submitted through [email or web address] and received the following: [employer] has not yet replied to your PA request. You may close this dialog, return to your dashboard, and perform other tasks. To check for an update later, open this request again from your dashboard. If [employer] has not replied to your request within 24-72 hours please contact [employer] at [phone].

## 2017-12-25 ENCOUNTER — Other Ambulatory Visit: Payer: Self-pay | Admitting: Family Medicine

## 2017-12-25 DIAGNOSIS — I10 Essential (primary) hypertension: Secondary | ICD-10-CM

## 2018-01-31 ENCOUNTER — Other Ambulatory Visit: Payer: Self-pay | Admitting: Family Medicine

## 2018-01-31 DIAGNOSIS — I10 Essential (primary) hypertension: Secondary | ICD-10-CM

## 2018-02-09 ENCOUNTER — Other Ambulatory Visit: Payer: Self-pay | Admitting: Family Medicine

## 2018-02-11 NOTE — Telephone Encounter (Signed)
Deniedon January 25  PA Case: [id number], Status: Partially Approved, Coverage Starts on: 12/12/2017 12:00:00 AM, Coverage Ends on: 12/13/2019 12:00:00 AM. Questions? Contact [phone].

## 2018-04-04 ENCOUNTER — Other Ambulatory Visit: Payer: Self-pay | Admitting: Family Medicine

## 2018-04-04 DIAGNOSIS — I10 Essential (primary) hypertension: Secondary | ICD-10-CM

## 2018-04-12 ENCOUNTER — Encounter: Payer: Self-pay | Admitting: Family Medicine

## 2018-04-12 ENCOUNTER — Ambulatory Visit (INDEPENDENT_AMBULATORY_CARE_PROVIDER_SITE_OTHER): Payer: Medicare HMO | Admitting: Family Medicine

## 2018-04-12 VITALS — BP 130/88 | HR 82 | Temp 98.9°F | Resp 18 | Ht 63.0 in | Wt 176.0 lb

## 2018-04-12 DIAGNOSIS — I10 Essential (primary) hypertension: Secondary | ICD-10-CM | POA: Diagnosis not present

## 2018-04-12 MED ORDER — CLOBETASOL PROPIONATE 0.05 % EX CREA: TOPICAL_CREAM | CUTANEOUS | 3 refills | Status: DC

## 2018-04-12 NOTE — Progress Notes (Signed)
Subjective:    Patient ID: Kristina Dougherty, female    DOB: August 04, 1946, 72 y.o.   MRN: [id number]  HPI Patient presents today for follow-up of hypertension.  She denies any chest pain shortness of breath or dyspnea on exertion.  Her blood pressure is well controlled at 130/88.  She is interested in weaning off paroxetine.  She is currently taking 40 mg a day.  She states however she is afraid to because she gets "wild" without it.  When I asked why she wants to wean off of it since she seems to be doing relatively well, she thinks that she will be able to lose more weight off of the medication.  We discussed options for anxiety and depression that are not prone to cause weight gain and options include Trintellix which is extremely expensive, and Wellbutrin which tends not to work as well for anxiety.  I have recommended that the patient continue the medication given her previous poor reaction when she stopped it in the past.  She has a history of osteopenia with her last bone density test in 2017.  She is due this year but she defers that for now.  She is currently taking Fosamax calcium and vitamin D.  She has scheduled her own mammogram for later this year.  She is overdue for a colonoscopy but she refuses this.  She would consent to a Cologuard.  Otherwise she is doing well with no concerns Past Medical History:  Diagnosis Date  . Anxiety   . Arthritis   . Depression   . Hypertension   . Osteopenia    Past Surgical History:  Procedure Laterality Date  . ABDOMINAL HYSTERECTOMY    . APPENDECTOMY     Current Outpatient Medications on File Prior to Visit  Medication Sig Dispense Refill  . alendronate (FOSAMAX) 70 MG tablet TAKE 1 TABLET EVERY 7 DAYS. TAKE WITH A FULL GLASS OF WATER ON AN EMPTY STOMACH. 12 tablet 3  . amLODipine (NORVASC) 10 MG tablet Take 1 tablet (10 mg total) by mouth daily. Needs office visit and labs before further refills 90 tablet 0  . aspirin 81 MG tablet Take 81 mg  by mouth daily.      . hydrochlorothiazide (MICROZIDE) 12.5 MG capsule Take 1 capsule (12.5 mg total) by mouth daily. 90 capsule 1  . losartan (COZAAR) 50 MG tablet TAKE 1 TABLET EVERY DAY 90 tablet 0  . meloxicam (MOBIC) 15 MG tablet TAKE 1 TABLET EVERY DAY 90 tablet 1  . Multiple Vitamin (MULITIVITAMIN WITH MINERALS) TABS Take 1 tablet by mouth daily.    Marland Kitchen PARoxetine (PAXIL) 20 MG tablet Take 1 tablet (20 mg total) by mouth 2 (two) times daily. 180 tablet 3   No current facility-administered medications on file prior to visit.    Allergies  Allergen Reactions  . Morphine And Related     Severe HA per pt  . Nitrofurantoin Nausea And Vomiting   Social History   Socioeconomic History  . Marital status: Divorced    Spouse name: Not on file  . Number of children: Not on file  . Years of education: Not on file  . Highest education level: Not on file  Occupational History  . Not on file  Social Needs  . Financial resource strain: Not on file  . Food insecurity:    Worry: Not on file    Inability: Not on file  . Transportation needs:    Medical: Not on file  Non-medical: Not on file  Tobacco Use  . Smoking status: Current Every Day Smoker    Packs/day: 0.30    Types: Cigarettes    Last attempt to quit: 08/28/2011    Years since quitting: 6.6  . Smokeless tobacco: Never Used  Substance and Sexual Activity  . Alcohol use: No  . Drug use: No  . Sexual activity: Never  Lifestyle  . Physical activity:    Days per week: Not on file    Minutes per session: Not on file  . Stress: Not on file  Relationships  . Social connections:    Talks on phone: Not on file    Gets together: Not on file    Attends religious service: Not on file    Active member of club or organization: Not on file    Attends meetings of clubs or organizations: Not on file    Relationship status: Not on file  . Intimate partner violence:    Fear of current or ex partner: Not on file    Emotionally  abused: Not on file    Physically abused: Not on file    Forced sexual activity: Not on file  Other Topics Concern  . Not on file  Social History Narrative  . Not on file      Review of Systems  All other systems reviewed and are negative.      Objective:   Physical Exam  Constitutional: She appears well-developed and well-nourished. No distress.  Eyes: Pupils are equal, round, and reactive to light. Conjunctivae and EOM are normal.  Neck: Normal range of motion. No JVD present. No thyromegaly present.  Cardiovascular: Normal rate, regular rhythm and normal heart sounds. Exam reveals no gallop and no friction rub.  No murmur heard. Pulmonary/Chest: Effort normal and breath sounds normal. No stridor. No respiratory distress. She has no wheezes. She has no rales. She exhibits no tenderness.  Abdominal: Soft. Bowel sounds are normal. She exhibits no distension and no mass. There is no tenderness. There is no rebound and no guarding.  Musculoskeletal: She exhibits no edema.  Lymphadenopathy:    She has no cervical adenopathy.  Skin: She is not diaphoretic.  Vitals reviewed.         Assessment & Plan:  Benign essential HTN - Plan: CBC with Differential/Platelet, COMPLETE METABOLIC PANEL WITH GFR  Blood pressures well controlled today.  Patient is not fasting and therefore cannot check fasting lipid panel however I will check a CBC and a CMP.  Patient has already scheduled her mammogram.  She declines a bone density test.  I will schedule her for Cologuard.  Ultimately the patient decided to stay on paroxetine.  The remainder of her preventative care is up-to-date.

## 2018-04-13 LAB — CBC WITH DIFFERENTIAL/PLATELET
BASOS ABS: 58 {cells}/uL (ref 0–200)
Basophils Relative: 0.8 %
Eosinophils Absolute: 139 cells/uL (ref 15–500)
Eosinophils Relative: 1.9 %
HEMATOCRIT: 41.9 % (ref 35.0–45.0)
Hemoglobin: 14.6 g/dL (ref 11.7–15.5)
LYMPHS ABS: 2270 {cells}/uL (ref 850–3900)
MCH: 29.2 pg (ref 27.0–33.0)
MCHC: 34.8 g/dL (ref 32.0–36.0)
MCV: 83.8 fL (ref 80.0–100.0)
MPV: 11.7 fL (ref 7.5–12.5)
Monocytes Relative: 7.8 %
NEUTROS PCT: 58.4 %
Neutro Abs: 4263 cells/uL (ref 1500–7800)
PLATELETS: 216 10*3/uL (ref 140–400)
RBC: 5 10*6/uL (ref 3.80–5.10)
RDW: 12.6 % (ref 11.0–15.0)
TOTAL LYMPHOCYTE: 31.1 %
WBC mixed population: 569 cells/uL (ref 200–950)
WBC: 7.3 10*3/uL (ref 3.8–10.8)

## 2018-04-13 LAB — COMPLETE METABOLIC PANEL WITH GFR
AG Ratio: 1.7 (calc) (ref 1.0–2.5)
ALKALINE PHOSPHATASE (APISO): 79 U/L (ref 33–130)
ALT: 12 U/L (ref 6–29)
AST: 19 U/L (ref 10–35)
Albumin: 4 g/dL (ref 3.6–5.1)
BUN/Creatinine Ratio: 26 (calc) — ABNORMAL HIGH (ref 6–22)
BUN: 24 mg/dL (ref 7–25)
CALCIUM: 10 mg/dL (ref 8.6–10.4)
CO2: 30 mmol/L (ref 20–32)
Chloride: 101 mmol/L (ref 98–110)
Creat: 0.94 mg/dL — ABNORMAL HIGH (ref 0.60–0.93)
GFR, EST NON AFRICAN AMERICAN: 60 mL/min/{1.73_m2} (ref >=60)
GFR, Est African American: 69 mL/min/{1.73_m2} (ref >=60)
GLOBULIN: 2.4 g/dL (ref 1.9–3.7)
GLUCOSE: 94 mg/dL (ref 65–99)
Potassium: 4 mmol/L (ref 3.5–5.3)
Sodium: 139 mmol/L (ref 135–146)
Total Bilirubin: 0.4 mg/dL (ref 0.2–1.2)
Total Protein: 6.4 g/dL (ref 6.1–8.1)

## 2018-04-15 ENCOUNTER — Encounter: Payer: Self-pay | Admitting: *Deleted

## 2018-04-17 ENCOUNTER — Telehealth: Payer: Self-pay | Admitting: *Deleted

## 2018-04-17 NOTE — Telephone Encounter (Signed)
Received verbal orders for Cologuard.   Order placed via [facility].   Order [id number] has been submitted.

## 2018-04-24 ENCOUNTER — Other Ambulatory Visit: Payer: Self-pay | Admitting: Family Medicine

## 2018-04-24 DIAGNOSIS — I10 Essential (primary) hypertension: Secondary | ICD-10-CM

## 2018-05-06 ENCOUNTER — Telehealth: Payer: Self-pay | Admitting: *Deleted

## 2018-05-06 NOTE — Telephone Encounter (Signed)
Received request from pharmacy for PA on Paxil.  PA submitted.   Dx: F34.1-  Dysthymic disorder

## 2018-05-06 NOTE — Telephone Encounter (Signed)
[employer] has not yet replied to your PA request. You may close this dialog, return to your dashboard, and perform other tasks.  To check for an update later, open this request again from your dashboard.  If [employer] has not replied to your request within 24-72 hours please contact [employer] at [phone]. 

## 2018-05-07 MED ORDER — PAROXETINE HCL 20 MG PO TABS: 20.0000 mg | ORAL_TABLET | Freq: Two times a day (BID) | ORAL | 3 refills | Status: DC

## 2018-05-07 NOTE — Telephone Encounter (Signed)
Received PA determination.   PA [id number] approved 05/06/2018- 05/06/2020.  Pharmacy made aware.

## 2018-05-16 ENCOUNTER — Telehealth: Payer: Self-pay | Admitting: Family Medicine

## 2018-05-16 NOTE — Telephone Encounter (Signed)
Pt would like to know if you would call her in something for sleep - she is having problems sleeping at night and staying asleep?

## 2018-05-17 MED ORDER — TRAZODONE HCL 50 MG PO TABS: 50.0000 mg | ORAL_TABLET | Freq: Every evening | ORAL | 3 refills | Status: DC | PRN

## 2018-05-17 NOTE — Telephone Encounter (Signed)
Could try trazodone 50 mg poqhs prn.

## 2018-05-17 NOTE — Telephone Encounter (Signed)
Patient aware of providers recommendations and med sent to pharm 

## 2018-06-10 ENCOUNTER — Other Ambulatory Visit: Payer: Self-pay | Admitting: Family Medicine

## 2018-06-10 DIAGNOSIS — I10 Essential (primary) hypertension: Secondary | ICD-10-CM

## 2018-08-13 ENCOUNTER — Other Ambulatory Visit: Payer: Self-pay | Admitting: Family Medicine

## 2018-08-13 DIAGNOSIS — I10 Essential (primary) hypertension: Secondary | ICD-10-CM

## 2018-09-05 DIAGNOSIS — H6123 Impacted cerumen, bilateral: Secondary | ICD-10-CM | POA: Diagnosis not present

## 2018-12-25 ENCOUNTER — Telehealth: Payer: Self-pay | Admitting: Family Medicine

## 2018-12-25 NOTE — Telephone Encounter (Signed)
Pt called and wanted to know if you would give her some Xanax's to take at night to help her sleep and a few to take during the day when he has increased anxiety?? She was unable to take the Trazodone d/t hangover effect in the morning.

## 2018-12-26 ENCOUNTER — Other Ambulatory Visit: Payer: Self-pay | Admitting: Family Medicine

## 2018-12-26 MED ORDER — ALPRAZOLAM 0.5 MG PO TABS: 0.5000 mg | ORAL_TABLET | Freq: Three times a day (TID) | ORAL | 0 refills | Status: DC | PRN

## 2018-12-26 NOTE — Telephone Encounter (Signed)
Ok, I will escribe 30.  Try to use them sparingly.

## 2018-12-26 NOTE — Telephone Encounter (Signed)
Pt aware.

## 2019-01-08 ENCOUNTER — Other Ambulatory Visit: Payer: Self-pay | Admitting: Family Medicine

## 2019-01-08 DIAGNOSIS — I10 Essential (primary) hypertension: Secondary | ICD-10-CM

## 2019-03-03 ENCOUNTER — Other Ambulatory Visit: Payer: Self-pay | Admitting: Family Medicine

## 2019-03-03 MED ORDER — CLOBETASOL PROPIONATE 0.05 % EX CREA: TOPICAL_CREAM | CUTANEOUS | 3 refills | Status: DC

## 2019-03-27 ENCOUNTER — Ambulatory Visit (INDEPENDENT_AMBULATORY_CARE_PROVIDER_SITE_OTHER): Payer: Medicare HMO | Admitting: Family Medicine

## 2019-03-27 ENCOUNTER — Encounter: Payer: Self-pay | Admitting: Family Medicine

## 2019-03-27 ENCOUNTER — Other Ambulatory Visit: Payer: Self-pay

## 2019-03-27 DIAGNOSIS — R140 Abdominal distension (gaseous): Secondary | ICD-10-CM | POA: Diagnosis not present

## 2019-03-27 DIAGNOSIS — R1013 Epigastric pain: Secondary | ICD-10-CM | POA: Diagnosis not present

## 2019-03-27 MED ORDER — PANTOPRAZOLE SODIUM 40 MG PO TBEC: 40.0000 mg | DELAYED_RELEASE_TABLET | Freq: Every day | ORAL | 3 refills | Status: DC

## 2019-03-27 NOTE — Progress Notes (Signed)
Subjective:    Patient ID: Kristina Dougherty, female    DOB: February 07, 1946, 73 y.o.   MRN: [id number]  HPI  Patient is being seen today via telephone.  She consents to be seen by telephone.  She is currently at home.  I am currently in my office.  Phone call began at 1105.  Phone call concluded at 1117.  Patient states that for the last 3 weeks she has been suffering with abdominal bloating.  Symptoms began after taking antibiotics for an abscessed tooth.  She takes this is happened before after she takes antibiotics.  For the last 3 weeks, no matter how much she eats, whether it is one bite or 10 bites, she will develop swelling and bloating in her abdomen.  She denies any nausea or vomiting but she will have epigastric discomfort and bloating.  She denies any diarrhea.  She denies any constipation.  She states that she is having 2 bowel movements a day.  She denies any blood in her stool.  She denies any melena or hematochezia.  She denies any pain in her right upper quadrant.  She is already had an fundectomy in the past.  She is also had a history of a hysterectomy.  She denies any weight loss or fevers or chills.  She is tried antacid tablets and seen slight benefit. Past Medical History:  Diagnosis Date  . Anxiety   . Arthritis   . Depression   . Hypertension   . Osteopenia    Past Surgical History:  Procedure Laterality Date  . ABDOMINAL HYSTERECTOMY    . APPENDECTOMY     Current Outpatient Medications on File Prior to Visit  Medication Sig Dispense Refill  . meloxicam (MOBIC) 15 MG tablet TAKE 1 TABLET EVERY DAY 90 tablet 1  . alendronate (FOSAMAX) 70 MG tablet TAKE 1 TABLET EVERY 7 DAYS. TAKE WITH A FULL GLASS OF WATER ON AN EMPTY STOMACH. (Patient not taking: Reported on 03/27/2019) 12 tablet 3  . ALPRAZolam (XANAX) 0.5 MG tablet Take 1 tablet (0.5 mg total) by mouth 3 (three) times daily as needed for anxiety or sleep. 30 tablet 0  . amLODipine (NORVASC) 10 MG tablet Take 1 tablet  (10 mg total) by mouth daily. 90 tablet 3  . aspirin 81 MG tablet Take 81 mg by mouth daily.      . clobetasol cream (TEMOVATE) 8.18 % APPLY ONE APPLICATION TOPICALLY TWO TIMES DAILY 30 g 3  . hydrochlorothiazide (MICROZIDE) 12.5 MG capsule TAKE 1 CAPSULE EVERY DAY 90 capsule 1  . losartan (COZAAR) 50 MG tablet TAKE 1 TABLET EVERY DAY (NEED MD APPOINTMENT) 90 tablet 0  . Multiple Vitamin (MULITIVITAMIN WITH MINERALS) TABS Take 1 tablet by mouth daily.    Marland Kitchen PARoxetine (PAXIL) 20 MG tablet Take 1 tablet (20 mg total) by mouth 2 (two) times daily. 180 tablet 3   No current facility-administered medications on file prior to visit.    Allergies  Allergen Reactions  . Morphine And Related     Severe HA per pt  . Nitrofurantoin Nausea And Vomiting   Social History   Socioeconomic History  . Marital status: Divorced    Spouse name: Not on file  . Number of children: Not on file  . Years of education: Not on file  . Highest education level: Not on file  Occupational History  . Not on file  Social Needs  . Financial resource strain: Not on file  . Food insecurity:  Worry: Not on file    Inability: Not on file  . Transportation needs:    Medical: Not on file    Non-medical: Not on file  Tobacco Use  . Smoking status: Current Every Day Smoker    Packs/day: 0.30    Types: Cigarettes    Last attempt to quit: 08/28/2011    Years since quitting: 7.5  . Smokeless tobacco: Never Used  Substance and Sexual Activity  . Alcohol use: No  . Drug use: No  . Sexual activity: Never  Lifestyle  . Physical activity:    Days per week: Not on file    Minutes per session: Not on file  . Stress: Not on file  Relationships  . Social connections:    Talks on phone: Not on file    Gets together: Not on file    Attends religious service: Not on file    Active member of club or organization: Not on file    Attends meetings of clubs or organizations: Not on file    Relationship status: Not on  file  . Intimate partner violence:    Fear of current or ex partner: Not on file    Emotionally abused: Not on file    Physically abused: Not on file    Forced sexual activity: Not on file  Other Topics Concern  . Not on file  Social History Narrative  . Not on file     Review of Systems  All other systems reviewed and are negative.      Objective:   Physical Exam   No physical exam was performed today as patient was seen over the telephone    Assessment & Plan:  Bloating  Postprandial epigastric pain  Not certain.  Differential diagnosis is large.  Given the association with antibiotic use, it is possible that this represents a bacterial imbalance in the stomach or IBS.  Therefore I will try the patient on probiotics and give her samples of Restora 1 tablet daily for 1 or 2 weeks and see if the symptoms improve.  Also on the differential diagnosis would be peptic ulcer disease or dyspepsia and therefore I will also try the patient on Protonix 1 tablet daily for 1 to 2 weeks to see if the symptoms will improve.  Consider gastroparesis or other is a partial obstruction given her previous surgeries such as adhesions if symptoms do not improve however I favor IBS based on her description.  Patient is comfortable trying the medication for 1 to 2 weeks prior to any testing.  Phone call concluded at 1117.  Total phone time was 12 minutes.

## 2019-04-10 ENCOUNTER — Other Ambulatory Visit: Payer: Self-pay | Admitting: Family Medicine

## 2019-04-10 DIAGNOSIS — I10 Essential (primary) hypertension: Secondary | ICD-10-CM

## 2019-05-28 ENCOUNTER — Other Ambulatory Visit: Payer: Self-pay | Admitting: Family Medicine

## 2019-07-24 ENCOUNTER — Other Ambulatory Visit: Payer: Self-pay | Admitting: Family Medicine

## 2019-07-24 DIAGNOSIS — I10 Essential (primary) hypertension: Secondary | ICD-10-CM

## 2019-09-23 ENCOUNTER — Other Ambulatory Visit: Payer: Self-pay | Admitting: Family Medicine

## 2019-09-23 NOTE — Telephone Encounter (Signed)
Pt is requesting refill on Xanax   LOV: 03/27/2019  LRF:   12/26/18

## 2019-09-30 ENCOUNTER — Telehealth: Payer: Self-pay | Admitting: Family Medicine

## 2019-09-30 DIAGNOSIS — Z1211 Encounter for screening for malignant neoplasm of colon: Secondary | ICD-10-CM

## 2019-09-30 NOTE — Telephone Encounter (Signed)
Patient calling to say she wants to be scheduled for a colonoscopy  But does not know any doc that is good to do this would like recommendations and a referral to get this done please call her at [phone]

## 2019-10-01 NOTE — Telephone Encounter (Signed)
OK to do referral

## 2019-10-02 NOTE — Telephone Encounter (Signed)
Sure, Dr. Benson Norway

## 2019-10-03 NOTE — Telephone Encounter (Signed)
Referral placed.

## 2019-10-20 DIAGNOSIS — R194 Change in bowel habit: Secondary | ICD-10-CM | POA: Diagnosis not present

## 2019-10-20 DIAGNOSIS — K580 Irritable bowel syndrome with diarrhea: Secondary | ICD-10-CM | POA: Diagnosis not present

## 2019-10-20 DIAGNOSIS — R197 Diarrhea, unspecified: Secondary | ICD-10-CM | POA: Diagnosis not present

## 2019-10-20 DIAGNOSIS — R140 Abdominal distension (gaseous): Secondary | ICD-10-CM | POA: Diagnosis not present

## 2019-10-30 DIAGNOSIS — D123 Benign neoplasm of transverse colon: Secondary | ICD-10-CM | POA: Diagnosis not present

## 2019-10-30 DIAGNOSIS — K529 Noninfective gastroenteritis and colitis, unspecified: Secondary | ICD-10-CM | POA: Diagnosis not present

## 2019-10-30 DIAGNOSIS — D124 Benign neoplasm of descending colon: Secondary | ICD-10-CM | POA: Diagnosis not present

## 2019-10-30 DIAGNOSIS — K5790 Diverticulosis of intestine, part unspecified, without perforation or abscess without bleeding: Secondary | ICD-10-CM | POA: Diagnosis not present

## 2019-10-30 DIAGNOSIS — R197 Diarrhea, unspecified: Secondary | ICD-10-CM | POA: Diagnosis not present

## 2019-10-30 DIAGNOSIS — K449 Diaphragmatic hernia without obstruction or gangrene: Secondary | ICD-10-CM | POA: Diagnosis not present

## 2019-10-30 DIAGNOSIS — D122 Benign neoplasm of ascending colon: Secondary | ICD-10-CM | POA: Diagnosis not present

## 2019-10-30 DIAGNOSIS — K635 Polyp of colon: Secondary | ICD-10-CM | POA: Diagnosis not present

## 2019-12-01 ENCOUNTER — Other Ambulatory Visit: Payer: Self-pay | Admitting: Family Medicine

## 2019-12-01 DIAGNOSIS — I10 Essential (primary) hypertension: Secondary | ICD-10-CM

## 2019-12-15 DIAGNOSIS — R197 Diarrhea, unspecified: Secondary | ICD-10-CM | POA: Diagnosis not present

## 2019-12-15 DIAGNOSIS — K640 First degree hemorrhoids: Secondary | ICD-10-CM | POA: Diagnosis not present

## 2019-12-15 DIAGNOSIS — K625 Hemorrhage of anus and rectum: Secondary | ICD-10-CM | POA: Diagnosis not present

## 2020-02-11 ENCOUNTER — Other Ambulatory Visit: Payer: Self-pay | Admitting: Family Medicine

## 2020-02-11 DIAGNOSIS — I10 Essential (primary) hypertension: Secondary | ICD-10-CM

## 2020-03-10 ENCOUNTER — Other Ambulatory Visit: Payer: Self-pay | Admitting: Family Medicine

## 2020-03-10 DIAGNOSIS — M1712 Unilateral primary osteoarthritis, left knee: Secondary | ICD-10-CM | POA: Diagnosis not present

## 2020-03-10 DIAGNOSIS — I10 Essential (primary) hypertension: Secondary | ICD-10-CM

## 2020-03-10 DIAGNOSIS — M1711 Unilateral primary osteoarthritis, right knee: Secondary | ICD-10-CM | POA: Diagnosis not present

## 2020-03-10 DIAGNOSIS — M170 Bilateral primary osteoarthritis of knee: Secondary | ICD-10-CM | POA: Diagnosis not present

## 2020-04-19 ENCOUNTER — Other Ambulatory Visit: Payer: Self-pay | Admitting: Family Medicine

## 2020-04-19 DIAGNOSIS — I10 Essential (primary) hypertension: Secondary | ICD-10-CM

## 2020-05-19 ENCOUNTER — Other Ambulatory Visit: Payer: Self-pay | Admitting: Family Medicine

## 2020-08-20 ENCOUNTER — Other Ambulatory Visit: Payer: Self-pay | Admitting: *Deleted

## 2020-08-20 DIAGNOSIS — I10 Essential (primary) hypertension: Secondary | ICD-10-CM

## 2020-08-20 MED ORDER — AMLODIPINE BESYLATE 10 MG PO TABS: 10.0000 mg | ORAL_TABLET | Freq: Every day | ORAL | 3 refills | Status: DC

## 2020-08-20 MED ORDER — LOSARTAN POTASSIUM 50 MG PO TABS: ORAL_TABLET | ORAL | 3 refills | Status: DC

## 2020-08-20 MED ORDER — MELOXICAM 15 MG PO TABS: 15.0000 mg | ORAL_TABLET | Freq: Every day | ORAL | 1 refills | Status: DC

## 2020-08-20 MED ORDER — PAROXETINE HCL 20 MG PO TABS: 20.0000 mg | ORAL_TABLET | Freq: Two times a day (BID) | ORAL | 3 refills | Status: DC

## 2020-08-20 MED ORDER — HYDROCHLOROTHIAZIDE 12.5 MG PO CAPS: 12.5000 mg | ORAL_CAPSULE | Freq: Every day | ORAL | 3 refills | Status: DC

## 2020-08-26 ENCOUNTER — Other Ambulatory Visit: Payer: Self-pay | Admitting: Family Medicine

## 2020-08-26 DIAGNOSIS — I10 Essential (primary) hypertension: Secondary | ICD-10-CM

## 2020-08-26 MED ORDER — AMLODIPINE BESYLATE 10 MG PO TABS: 10.0000 mg | ORAL_TABLET | Freq: Every day | ORAL | 3 refills | Status: DC

## 2020-08-26 MED ORDER — HYDROCHLOROTHIAZIDE 12.5 MG PO CAPS: 12.5000 mg | ORAL_CAPSULE | Freq: Every day | ORAL | 3 refills | Status: DC

## 2020-08-26 MED ORDER — PAROXETINE HCL 20 MG PO TABS: 20.0000 mg | ORAL_TABLET | Freq: Two times a day (BID) | ORAL | 3 refills | Status: DC

## 2020-08-26 MED ORDER — MELOXICAM 15 MG PO TABS: 15.0000 mg | ORAL_TABLET | Freq: Every day | ORAL | 1 refills | Status: DC

## 2020-08-26 MED ORDER — PANTOPRAZOLE SODIUM 40 MG PO TBEC: 40.0000 mg | DELAYED_RELEASE_TABLET | Freq: Every day | ORAL | 3 refills | Status: DC

## 2020-08-26 MED ORDER — LOSARTAN POTASSIUM 50 MG PO TABS: ORAL_TABLET | ORAL | 3 refills | Status: DC

## 2020-08-26 NOTE — Telephone Encounter (Signed)
Prescription sent to pharmacy.

## 2020-08-26 NOTE — Telephone Encounter (Signed)
Pt call to update pharmacy  information need all her medication to be fax - [facility] didn't have any other information just [facility]

## 2021-01-03 ENCOUNTER — Telehealth: Payer: Self-pay | Admitting: Family Medicine

## 2021-01-03 NOTE — Telephone Encounter (Signed)
pt would like to have Dr.Pickard prescribe Xanx for her pt has lot going on

## 2021-01-03 NOTE — Telephone Encounter (Signed)
Patient has not been seen in >1 year. Will need appointment prior to medication refill for controlled substances.

## 2021-03-19 ENCOUNTER — Other Ambulatory Visit: Payer: Self-pay | Admitting: Family Medicine

## 2021-08-06 ENCOUNTER — Other Ambulatory Visit: Payer: Self-pay | Admitting: Family Medicine

## 2021-08-06 DIAGNOSIS — I10 Essential (primary) hypertension: Secondary | ICD-10-CM

## 2021-08-27 DIAGNOSIS — R300 Dysuria: Secondary | ICD-10-CM | POA: Diagnosis not present

## 2021-12-04 ENCOUNTER — Other Ambulatory Visit: Payer: Self-pay

## 2021-12-04 ENCOUNTER — Encounter (HOSPITAL_COMMUNITY): Payer: Self-pay

## 2021-12-04 ENCOUNTER — Observation Stay (HOSPITAL_COMMUNITY)
Admission: EM | Admit: 2021-12-04 | Discharge: 2021-12-06 | Disposition: A | Discharge status: Home / Self Care | Payer: Medicare Other | Attending: Internal Medicine | Admitting: Internal Medicine

## 2021-12-04 ENCOUNTER — Observation Stay (HOSPITAL_COMMUNITY): Payer: Medicare Other

## 2021-12-04 DIAGNOSIS — F17210 Nicotine dependence, cigarettes, uncomplicated: Secondary | ICD-10-CM | POA: Diagnosis not present

## 2021-12-04 DIAGNOSIS — I10 Essential (primary) hypertension: Secondary | ICD-10-CM | POA: Insufficient documentation

## 2021-12-04 DIAGNOSIS — Z7982 Long term (current) use of aspirin: Secondary | ICD-10-CM | POA: Diagnosis not present

## 2021-12-04 DIAGNOSIS — R002 Palpitations: Secondary | ICD-10-CM | POA: Diagnosis present

## 2021-12-04 DIAGNOSIS — I4891 Unspecified atrial fibrillation: Principal | ICD-10-CM | POA: Diagnosis present

## 2021-12-04 DIAGNOSIS — E876 Hypokalemia: Secondary | ICD-10-CM | POA: Diagnosis not present

## 2021-12-04 DIAGNOSIS — Z20822 Contact with and (suspected) exposure to covid-19: Secondary | ICD-10-CM | POA: Insufficient documentation

## 2021-12-04 DIAGNOSIS — D72829 Elevated white blood cell count, unspecified: Secondary | ICD-10-CM | POA: Diagnosis not present

## 2021-12-04 DIAGNOSIS — F341 Dysthymic disorder: Secondary | ICD-10-CM | POA: Diagnosis present

## 2021-12-04 DIAGNOSIS — Z7901 Long term (current) use of anticoagulants: Secondary | ICD-10-CM | POA: Insufficient documentation

## 2021-12-04 DIAGNOSIS — Z79899 Other long term (current) drug therapy: Secondary | ICD-10-CM | POA: Insufficient documentation

## 2021-12-04 LAB — COMPREHENSIVE METABOLIC PANEL
ALT: 31 U/L (ref 0–44)
AST: 24 U/L (ref 15–41)
Albumin: 4.2 g/dL (ref 3.5–5.0)
Alkaline Phosphatase: 63 U/L (ref 38–126)
Anion gap: 11 (ref 5–15)
BUN: 19 mg/dL (ref 8–23)
CO2: 27 mmol/L (ref 22–32)
Calcium: 9.5 mg/dL (ref 8.9–10.3)
Chloride: 100 mmol/L (ref 98–111)
Creatinine, Ser: 1.05 mg/dL — ABNORMAL HIGH (ref 0.44–1.00)
GFR, Estimated: 55 mL/min — ABNORMAL LOW (ref >60)
Glucose, Bld: 110 mg/dL — ABNORMAL HIGH (ref 70–99)
Potassium: 3.1 mmol/L — ABNORMAL LOW (ref 3.5–5.1)
Sodium: 138 mmol/L (ref 135–145)
Total Bilirubin: 0.9 mg/dL (ref 0.3–1.2)
Total Protein: 7 g/dL (ref 6.5–8.1)

## 2021-12-04 LAB — CBC WITH DIFFERENTIAL/PLATELET
Abs Immature Granulocytes: 0.08 10*3/uL — ABNORMAL HIGH (ref 0.00–0.07)
Basophils Absolute: 0.1 10*3/uL (ref 0.0–0.1)
Basophils Relative: 1 %
Eosinophils Absolute: 0.1 10*3/uL (ref 0.0–0.5)
Eosinophils Relative: 1 %
HCT: 41.5 % (ref 36.0–46.0)
Hemoglobin: 14.1 g/dL (ref 12.0–15.0)
Immature Granulocytes: 1 %
Lymphocytes Relative: 12 %
Lymphs Abs: 1.9 10*3/uL (ref 0.7–4.0)
MCH: 28.5 pg (ref 26.0–34.0)
MCHC: 34 g/dL (ref 30.0–36.0)
MCV: 84 fL (ref 80.0–100.0)
Monocytes Absolute: 1.1 10*3/uL — ABNORMAL HIGH (ref 0.1–1.0)
Monocytes Relative: 7 %
Neutro Abs: 12.3 10*3/uL — ABNORMAL HIGH (ref 1.7–7.7)
Neutrophils Relative %: 78 %
Platelets: 232 10*3/uL (ref 150–400)
RBC: 4.94 MIL/uL (ref 3.87–5.11)
RDW: 13 % (ref 11.5–15.5)
WBC: 15.6 10*3/uL — ABNORMAL HIGH (ref 4.0–10.5)
nRBC: 0 % (ref 0.0–0.2)

## 2021-12-04 LAB — RESP PANEL BY RT-PCR (FLU A&B, COVID) ARPGX2
Influenza A by PCR: NEGATIVE
Influenza B by PCR: NEGATIVE
SARS Coronavirus 2 by RT PCR: NEGATIVE

## 2021-12-04 LAB — CBG MONITORING, ED: Glucose-Capillary: 109 mg/dL — ABNORMAL HIGH (ref 70–99)

## 2021-12-04 LAB — LIPASE, BLOOD: Lipase: 33 U/L (ref 11–51)

## 2021-12-04 LAB — MAGNESIUM: Magnesium: 1.8 mg/dL (ref 1.7–2.4)

## 2021-12-04 LAB — TROPONIN I (HIGH SENSITIVITY)
Troponin I (High Sensitivity): 5 ng/L (ref <18)
Troponin I (High Sensitivity): 6 ng/L (ref <18)

## 2021-12-04 LAB — TSH: TSH: 3.499 u[IU]/mL (ref 0.350–4.500)

## 2021-12-04 MED ORDER — APIXABAN 5 MG PO TABS
5.0000 mg | ORAL_TABLET | Freq: Two times a day (BID) | ORAL | Status: DC
  Administered 2021-12-04 – 2021-12-06 (×4): 5 mg via ORAL
  Filled 2021-12-04 (×4): qty 1

## 2021-12-04 MED ORDER — DILTIAZEM HCL-DEXTROSE 125-5 MG/125ML-% IV SOLN (PREMIX)
5.0000 mg/h | INTRAVENOUS | Status: AC
  Administered 2021-12-04: 22:00:00 5 mg/h via INTRAVENOUS
  Administered 2021-12-05: 7.5 mg/h via INTRAVENOUS
  Administered 2021-12-05: 15 mg/h via INTRAVENOUS
  Filled 2021-12-04 (×3): qty 125

## 2021-12-04 MED ORDER — POTASSIUM CHLORIDE CRYS ER 20 MEQ PO TBCR
40.0000 meq | EXTENDED_RELEASE_TABLET | Freq: Once | ORAL | Status: AC
  Administered 2021-12-04: 40 meq via ORAL
  Filled 2021-12-04: qty 2

## 2021-12-04 MED ORDER — DILTIAZEM LOAD VIA INFUSION
15.0000 mg | Freq: Once | INTRAVENOUS | Status: AC
  Administered 2021-12-04: 15 mg via INTRAVENOUS
  Filled 2021-12-04: qty 15

## 2021-12-04 NOTE — ED Provider Triage Note (Signed)
Emergency Medicine Provider Triage Evaluation Note  [name] , a 76 y.o. female  was evaluated in triage.  Pt complains of "having nerves in her stomach."  She states that for a day and a half she feels like all of her nerves are in her stomach.  She describes intermittent, cramping feeling in her abdomen.  She endorses intermittent nausea without vomiting and a few episodes of diarrhea.  She denies fevers.  Denies urinary symptoms.  History of hysterectomy, denies vaginal bleeding  Review of Systems  Positive: The above Negative:  Physical Exam  BP (!) 99/56 (BP Location: Right Arm)    Pulse 69    Temp 98.8 F (37.1 C) (Oral)    Ht 5\' 3"  (1.6 m)    Wt 81.6 kg    SpO2 99%    BMI 31.89 kg/m  Gen:   Awake, no distress   Resp:  Normal effort  MSK:   Moves extremities without difficulty  Other:  Abdomen is rounded, soft and nontender to palpation.  Medical Decision Making  Medically screening exam initiated at 7:43 PM.  Appropriate orders placed.  TALLULAH HOSMAN was informed that the remainder of the evaluation will be completed by another provider, this initial triage assessment does not replace that evaluation, and the importance of remaining in the ED until their evaluation is complete.     Mickie Hillier, PA-C 12/04/21 1945

## 2021-12-04 NOTE — Progress Notes (Addendum)
Discussed with Dr. Doren Custard. Patient presented to [facility] ED with "nerves in stomach" with intermittent N/V and few episodes of diarrhea.   VS: P 69, BP 99/56, O2 99%/RA, T 98.8  ECG reviewed (12/04/21, 19:48:50) with AF/RVR (HR 145, QRS 80, Qtc 426).  CHA2DS2-VASc (4: 39F, HTN). Labs with initial hsT r/o (5). Leukocytosis (WBC 15.6, bl 6-7). Hb (14.1, plt 232). Recommended starting apixaban 5 mg bid (75 yo, 81,6 kg, sCr 1.05). TSH (3.49), Mg slightly low (1.8). With N/V, diarrhea and leukocytosis may be infectious driver of this. Covid pending. Will need echo before discharge for new onset AF although ideally in NSR so can wait to get. Currently on dilt gtt at 12.5 mg/h, unknown prior EF and no known CAD.   Ok to admit to medicine at [facility] and have cardiology consult in AM.

## 2021-12-04 NOTE — ED Provider Notes (Signed)
[facility] DEPT Provider Note   CSN: [id number] Arrival date & time: 12/04/21  1912     History  Chief Complaint  Patient presents with   Nervous Feeling in [place] is a 76 y.o. female.  HPI Patient presents for what she describes as a nervous feeling in her stomach.  This has been present for the past 2 days.  It has waxed and waned in severity.  At times, she will feel palpitations.  She has had some mild associated shortness of breath.  She had 1 episode of nausea earlier today that resolved.  She has not had any vomiting or diarrhea.  She denies any infectious symptoms, including cough, fevers, chills, dysuria.  She has not had any recent medication changes.  She has not had any history of atrial fibrillation.  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  She has not undergone any cardiac surgeries.  Currently, she does not take any AV nodal blood pressure medicines.  She is prescribed losartan, HCTZ, and amlodipine.  She takes Paxil for anxiety.    Home Medications Prior to Admission medications   Medication Sig Start Date End Date Taking? Authorizing Provider  ALPRAZolam (XANAX) 0.5 MG tablet TAKE 1 TABLET BY MOUTH THREE TIMES DAILY AS NEEDED FOR ANXIETY OR SLEEP Patient taking differently: Take 0.5 mg by mouth at bedtime as needed for sleep. 09/25/19  Yes Susy Frizzle, MD  melatonin 1 MG TABS tablet Take 1 mg by mouth at bedtime as needed (sleep).   Yes [provider]  Multiple Vitamin (MULITIVITAMIN WITH MINERALS) TABS Take 1 tablet by mouth daily.   Yes [provider]  pantoprazole (PROTONIX) 40 MG tablet TAKE 1 TABLET BY MOUTH  DAILY Patient taking differently: 40 mg daily. 08/08/21  Yes Susy Frizzle, MD  PARoxetine (PAXIL) 20 MG tablet TAKE 1 TABLET BY MOUTH  TWICE DAILY Patient taking differently: Take 40 mg by mouth daily. 08/08/21  Yes Susy Frizzle, MD  alendronate (FOSAMAX) 70 MG tablet TAKE 1 TABLET EVERY 7 DAYS. TAKE WITH A FULL GLASS OF WATER ON AN EMPTY STOMACH. Patient not taking: No sig reported 02/11/18   Susy Frizzle, MD  apixaban (ELIQUIS) 5 MG TABS tablet Take 1 tablet (5 mg total) by mouth 2 (two) times daily. 12/06/21 03/06/22  [place] Donnamarie Poag, DO  clobetasol cream (TEMOVATE) 5.02 % APPLY ONE APPLICATION TOPICALLY TWO TIMES DAILY Patient taking differently: Apply 1 application topically See admin instructions. APPLY ONE APPLICATION TOPICALLY TWO TIMES DAILY prn 03/03/19   Susy Frizzle, MD  metoprolol tartrate (LOPRESSOR) 50 MG tablet Take 1 tablet by mouth 2  times daily. 12/06/21 03/06/22  [place] Eric J, DO      Allergies    Morphine and related and Nitrofurantoin    Review of Systems   Review of Systems  Constitutional:  Negative for activity change, appetite change, chills, fatigue and fever.  HENT:  Negative for congestion, ear pain and sore throat.   Eyes:  Negative for pain and visual disturbance.  Respiratory:  Positive for shortness of breath. Negative for cough.   Cardiovascular:  Positive for palpitations. Negative for chest pain and leg swelling.  Gastrointestinal:  Negative for abdominal pain, diarrhea, nausea and vomiting.  Genitourinary:  Negative for dysuria, flank pain and hematuria.  Musculoskeletal:  Negative for arthralgias, back pain, myalgias and neck pain.  Skin:  Negative for color change and rash.  Neurological:  Negative for dizziness, seizures, syncope, weakness, light-headedness, numbness and headaches.  Hematological:  Does not bruise/bleed easily.  Psychiatric/Behavioral:  Negative for confusion and decreased concentration.   All other systems reviewed and are negative.  Physical Exam Updated Vital Signs BP 107/78    Pulse (!) 116    Temp 97.7 F (36.5 C) (Oral)    Resp (!) 36    Ht 5\' 3"  (1.6 m)    Wt 82.4 kg    SpO2 95%    BMI 32.18 kg/m  Physical Exam Vitals and  nursing note reviewed.  Constitutional:      General: She is not in acute distress.    Appearance: Normal appearance. She is well-developed and normal weight. She is not ill-appearing, toxic-appearing or diaphoretic.  HENT:     Head: Normocephalic and atraumatic.     Right Ear: External ear normal.     Left Ear: External ear normal.     Nose: Nose normal.     Mouth/Throat:     Mouth: Mucous membranes are moist.     Pharynx: Oropharynx is clear.  Eyes:     General: No scleral icterus.    Extraocular Movements: Extraocular movements intact.     Conjunctiva/sclera: Conjunctivae normal.  Cardiovascular:     Rate and Rhythm: Tachycardia present. Rhythm irregular.     Heart sounds: No murmur heard. Pulmonary:     Effort: Pulmonary effort is normal. No respiratory distress.     Breath sounds: Normal breath sounds. No wheezing or rales.  Chest:     Chest wall: No tenderness.  Abdominal:     Palpations: Abdomen is soft.     Tenderness: There is no abdominal tenderness.  Musculoskeletal:        General: No swelling. Normal range of motion.     Cervical back: Normal range of motion and neck supple.     Right lower leg: No edema.     Left lower leg: No edema.  Skin:    General: Skin is warm and dry.     Capillary Refill: Capillary refill takes less than 2 seconds.     Coloration: Skin is not jaundiced or pale.  Neurological:     General: No focal deficit present.     Mental Status: She is alert and oriented to person, place, and time.     Cranial Nerves: No cranial nerve deficit.     Sensory: No sensory deficit.     Motor: No weakness.     Coordination: Coordination normal.  Psychiatric:        Mood and Affect: Mood normal.        Behavior: Behavior normal.        Thought Content: Thought content normal.        Judgment: Judgment normal.    ED Results / Procedures / Treatments   Labs (all labs ordered are listed, but only abnormal results are displayed) Labs Reviewed   COMPREHENSIVE METABOLIC PANEL - Abnormal; Notable for the following components:      Result Value   Potassium 3.1 (*)    Glucose, Bld 110 (*)    Creatinine, Ser 1.05 (*)    GFR, Estimated 55 (*)    All other components within normal limits  CBC WITH DIFFERENTIAL/PLATELET - Abnormal; Notable for the following components:   WBC 15.6 (*)    Neutro Abs 12.3 (*)    Monocytes Absolute 1.1 (*)  Abs Immature Granulocytes 0.08 (*)    All other components within normal limits  URINALYSIS, ROUTINE W REFLEX MICROSCOPIC - Abnormal; Notable for the following components:   Hgb urine dipstick SMALL (*)    Ketones, ur 5 (*)    Bacteria, UA RARE (*)    All other components within normal limits  BASIC METABOLIC PANEL - Abnormal; Notable for the following components:   Potassium 3.1 (*)    Glucose, Bld 153 (*)    Calcium 8.8 (*)    All other components within normal limits  BASIC METABOLIC PANEL - Abnormal; Notable for the following components:   Glucose, Bld 106 (*)    All other components within normal limits  CBG MONITORING, ED - Abnormal; Notable for the following components:   Glucose-Capillary 109 (*)    All other components within normal limits  RESP PANEL BY RT-PCR (FLU A&B, COVID) ARPGX2  MRSA NEXT GEN BY PCR, NASAL  LIPASE, BLOOD  TSH  MAGNESIUM  CBC  MAGNESIUM  MAGNESIUM  TROPONIN I (HIGH SENSITIVITY)  TROPONIN I (HIGH SENSITIVITY)    EKG EKG Interpretation  Date/Time:  Sunday December 04 2021 19:48:50 EST Ventricular Rate:  145 PR Interval:    QRS Duration: 80 QT Interval:  274 QTC Calculation: 426 R Axis:   92 Text Interpretation: Atrial fibrillation with rapid V-rate Right axis deviation Low voltage, extremity and precordial leads Anteroseptal infarct, old Repolarization abnormality, prob rate related Confirmed by Cloyd Ragas ([id number]) on 12/04/2021 8:53:28 PM  Radiology ECHOCARDIOGRAM COMPLETE  Result Date: 12/05/2021    ECHOCARDIOGRAM REPORT   Patient Name:   Hera E  Raval Date of Exam: 12/05/2021 Medical Rec #:  [id number]         Height:       63.0 in Accession #:    [id number]        Weight:       181.7 lb Date of Birth:  01/04/1946         BSA:          1.856 m Patient Age:    75 years          BP:           108/70 mmHg Patient Gender: F                 HR:           91  bpm. Exam Location:  Inpatient Procedure: 2D Echo Indications:    Atrial fibrillation  History:        Patient has no prior history of Echocardiogram examinations.                 Risk Factors:Hypertension.  Sonographer:    Arlyss Gandy Referring Phys: [id number] [place]  1. Left ventricular ejection fraction, by estimation, is 55 to 60%. The left ventricle has normal function. The left ventricle has no regional wall motion abnormalities. Left ventricular diastolic parameters are indeterminate.  2. Right ventricular systolic function is normal. The right ventricular size is normal. There is normal pulmonary artery systolic pressure. The estimated right ventricular systolic pressure is 37.1 mmHg.  3. The mitral valve is normal in structure. Mild mitral valve regurgitation. No evidence of mitral stenosis.  4. The aortic valve is tricuspid. Aortic valve regurgitation is not visualized. No aortic stenosis is present.  5. The inferior vena cava is normal in size with greater than 50% respiratory variability, suggesting right atrial pressure of 3 mmHg.  6. Cannot  exclude a small PFO. FINDINGS  Left Ventricle: Left ventricular ejection fraction, by estimation, is 55 to 60%. The left ventricle has normal function. The left ventricle has no regional wall motion abnormalities. The left ventricular internal cavity size was normal in size. There is  no left ventricular hypertrophy. Left ventricular diastolic parameters are indeterminate. Right Ventricle: The right ventricular size is normal. No increase in right ventricular wall thickness. Right ventricular systolic function is normal. There is  normal pulmonary artery systolic pressure. The tricuspid regurgitant velocity is 2.40 m/s, and  with an assumed right atrial pressure of 3 mmHg, the estimated right ventricular systolic pressure is 43.3 mmHg. Left Atrium: Left atrial size was normal in size. Right Atrium: Right atrial size was normal in size. Pericardium: Trivial pericardial effusion is present. Mitral Valve: The mitral valve is normal in structure. Mild mitral valve regurgitation. No evidence of mitral valve stenosis. Tricuspid Valve: The tricuspid valve is normal in structure. Tricuspid valve regurgitation is trivial. Aortic Valve: The aortic valve is tricuspid. Aortic valve regurgitation is not visualized. No aortic stenosis is present. Aortic valve mean gradient measures 4.0 mmHg. Aortic valve peak gradient measures 8.2 mmHg. Aortic valve area, by VTI measures 2.07 cm. Pulmonic Valve: The pulmonic valve was not well visualized. Pulmonic valve regurgitation is not visualized. Aorta: The aortic root and ascending aorta are structurally normal, with no evidence of dilitation. Venous: The inferior vena cava is normal in size with greater than 50% respiratory variability, suggesting right atrial pressure of 3 mmHg. IAS/Shunts: Cannot exclude a small PFO.  LEFT VENTRICLE PLAX 2D LVIDd:         3.90 cm   Diastology LVIDs:         2.80 cm   LV e' medial:    10.30 cm/s LV PW:         0.80 cm   LV E/e' medial:  11.7 LV IVS:        0.80 cm   LV e' lateral:   9.90 cm/s LVOT diam:     1.80 cm   LV E/e' lateral: 12.1 LV SV:         62 LV SV Index:   33 LVOT Area:     2.54 cm  RIGHT VENTRICLE             IVC RV Basal diam:  3.20 cm     IVC diam: 1.90 cm RV S prime:     12.60 cm/s TAPSE (M-mode): 1.5 cm LEFT ATRIUM             Index        RIGHT ATRIUM           Index LA diam:        4.00 cm 2.15 cm/m   RA Area:     14.00 cm LA Vol (A2C):   50.7 ml 27.31 ml/m  RA Volume:   28.90 ml  15.57 ml/m LA Vol (A4C):   43.1 ml 23.22 ml/m LA Biplane Vol: 47.4 ml  25.54 ml/m  AORTIC VALVE AV Area (Vmax):    2.19 cm AV Area (Vmean):   2.20 cm AV Area (VTI):     2.07 cm AV Vmax:           143.00 cm/s AV Vmean:          94.700 cm/s AV VTI:            0.297 m AV Peak Grad:      8.2  mmHg AV Mean Grad:      4.0 mmHg LVOT Vmax:         123.00 cm/s LVOT Vmean:        82.000 cm/s LVOT VTI:          0.242 m LVOT/AV VTI ratio: 0.81  AORTA Ao Root diam: 2.90 cm Ao Asc diam:  3.10 cm MITRAL VALVE                TRICUSPID VALVE MV Area (PHT): 3.99 cm     TR Peak grad:   23.0 mmHg MV Decel Time: 190 msec     TR Vmax:        240.00 cm/s MV E velocity: 120.00 cm/s                             SHUNTS                             Systemic VTI:  0.24 m                             Systemic Diam: 1.80 cm Oswaldo Milian MD Electronically signed by Oswaldo Milian MD Signature Date/Time: 12/05/2021/12:37:25 PM    Final     Procedures Procedures    Medications Ordered in ED Medications  diltiazem (CARDIZEM) 1 mg/mL load via infusion 15 mg (15 mg Intravenous Bolus from Bag 12/04/21 2135)    And  diltiazem (CARDIZEM) 125 mg in dextrose 5% 125 mL (1 mg/mL) infusion (0 mg/hr Intravenous Stopped 12/06/21 0615)  potassium chloride SA (KLOR-CON M) CR tablet 40 mEq (40 mEq Oral Given 12/04/21 2233)  magnesium sulfate IVPB 1 g 100 mL (0 g Intravenous Stopped 12/05/21 0645)  potassium chloride (KLOR-CON M) CR tablet 30 mEq (30 mEq Oral Given 12/05/21 1555)  sodium chloride 0.9 % bolus 250 mL (250 mLs Intravenous New Bag/Given 12/06/21 0300)    ED Course/ Medical Decision Making/ A&P                           Medical Decision Making  This patient presents to the ED for concern of "nervous feeling in stomach", this involves an extensive number of treatment options, and is a complaint that carries with it a high risk of complications and morbidity.  The differential diagnosis includes ACS, palpitations, polypharmacy, medication withdrawal, gastritis, reflux, hepatobiliary disease.   Co  morbidities that complicate the patient evaluation  HTN, anxiety, depression   Additional history obtained:  Additional history obtained from patient's significant other External records from outside source obtained and reviewed including EMR   Lab Tests:  I Ordered, and personally interpreted labs.  The pertinent results include: Mild hypokalemia, slight increase in creatinine from baseline, leukocytosis, normal troponin   Imaging Studies ordered:  I ordered imaging studies including chest x-ray  I independently visualized and interpreted imaging which showed no acute findings I agree with the radiologist interpretation   Cardiac Monitoring:  The patient was maintained on a cardiac monitor.  I personally viewed and interpreted the cardiac monitored which showed an underlying rhythm of: Atrial fibrillation with RVR   Medicines ordered and prescription drug management:  I ordered medication including diltiazem for rate control Reevaluation of the patient after these medicines showed that the patient improved I have reviewed the patients  home medicines and have made adjustments as needed  Critical Interventions:  Diltiazem bolus and drip for rate control of atrial fibrillation with RVR.   Consultations Obtained:  I requested consultation with the cardiology,  and discussed lab and imaging findings as well as pertinent plan - they agree with diltiazem gtt. They recommend initiation of oral anticoagulation tonight, admission to the hospital for cardiology evaluation in the morning.   Problem List / ED Course:  Pleasant 76 year old female presenting for a "uneasy feeling in her stomach".  On arrival in the ED, she is found to be in atrial fibrillation with RVR.  She has no known history of this.  She is not currently on any AV nodal agents or any blood thinning medications.  Patient heart rate was ranging in the 130s to 150s.  She was very mildly symptomatic endorsing only  the strange feeling in her stomach and very mild shortness of breath.  Blood pressures remained normal.  Laboratory work-up was initiated.  Patient was given diltiazem bolus and gtt.  She had good response and was able to maintain heart rate in the range of 105 on 12.5 mg/h diltiazem.  I consulted cardiology who recommends initiation of oral anticoagulation tonight and admission to the hospital.  They will evaluate in the morning and perform further testing.  Patient was started on Eliquis.  She had improved symptoms following heart rate control.  Blood pressures remained normal.  Patient was agreeable to admission.   Reevaluation:  After the interventions noted above, I reevaluated the patient and found that they have :improved   Social Determinants of Health:  Patient appears to be highly functional 76 year old female with family support at home, medical insurance, and access to outpatient medical care.   Dispostion:  After consideration of the diagnostic results and the patients response to treatment, I feel that the patent would benefit from admission to hospital.  CRITICAL CARE Performed by: Godfrey Pick   Total critical care time: 35 minutes  Critical care time was exclusive of separately billable procedures and treating other patients.  Critical care was necessary to treat or prevent imminent or life-threatening deterioration.  Critical care was time spent personally by me on the following activities: development of treatment plan with patient and/or surrogate as well as nursing, discussions with consultants, evaluation of patient's response to treatment, examination of patient, obtaining history from patient or surrogate, ordering and performing treatments and interventions, ordering and review of laboratory studies, ordering and review of radiographic studies, pulse oximetry and re-evaluation of patient's condition.           Final Clinical Impression(s) / ED  Diagnoses Final diagnoses:  Atrial fibrillation with RVR ([place])    Rx / DC Orders ED Discharge Orders          Ordered    metoprolol tartrate (LOPRESSOR) 50 MG tablet  2 times daily,   Status:  Discontinued        12/06/21 1244    apixaban (ELIQUIS) 5 MG TABS tablet  2 times daily,   Status:  Discontinued        12/06/21 1244    Increase activity slowly        12/06/21 1244    Diet - low sodium heart healthy        12/06/21 1244    No wound care        12/06/21 1244    Call MD for:  temperature >100.4        12/06/21  1244    Call MD for:  persistant nausea and vomiting        12/06/21 1244    Call MD for:  severe uncontrolled pain        12/06/21 1244    Call MD for:  persistant dizziness or light-headedness        12/06/21 1244    Call MD for:  difficulty breathing, headache or visual disturbances        12/06/21 1244    metoprolol tartrate (LOPRESSOR) 50 MG tablet  2 times daily        12/06/21 1454    apixaban (ELIQUIS) 5 MG TABS tablet  2 times daily        12/06/21 1454    Amb referral to [facility]        12/05/21 0421    Amb referral to [facility]        12/04/21 2109              Godfrey Pick, MD 12/07/21 [phone]

## 2021-12-04 NOTE — ED Triage Notes (Signed)
Pt states that she has a nervous feeling in her stomach. Pt reports having anxiety and states that it may be why she is having an elevated heart rate.

## 2021-12-04 NOTE — ED Provider Notes (Signed)
After triage - patient still on monitor receiving blood work and went into afib RVR. Rate of 150. EKG obtained.    Mickie Hillier, PA-C 12/04/21 1950    Godfrey Pick, MD 12/07/21 [phone]

## 2021-12-04 NOTE — ED Notes (Signed)
Kristina Dougherty, son, [phone].

## 2021-12-05 ENCOUNTER — Observation Stay (HOSPITAL_BASED_OUTPATIENT_CLINIC_OR_DEPARTMENT_OTHER): Payer: Medicare Other

## 2021-12-05 DIAGNOSIS — I4891 Unspecified atrial fibrillation: Secondary | ICD-10-CM

## 2021-12-05 DIAGNOSIS — D72829 Elevated white blood cell count, unspecified: Secondary | ICD-10-CM | POA: Diagnosis present

## 2021-12-05 DIAGNOSIS — I10 Essential (primary) hypertension: Secondary | ICD-10-CM

## 2021-12-05 DIAGNOSIS — E876 Hypokalemia: Secondary | ICD-10-CM | POA: Diagnosis not present

## 2021-12-05 LAB — BASIC METABOLIC PANEL
Anion gap: 9 (ref 5–15)
BUN: 12 mg/dL (ref 8–23)
CO2: 26 mmol/L (ref 22–32)
Calcium: 8.8 mg/dL — ABNORMAL LOW (ref 8.9–10.3)
Chloride: 103 mmol/L (ref 98–111)
Creatinine, Ser: 0.74 mg/dL (ref 0.44–1.00)
GFR, Estimated: >60 mL/min (ref >60)
Glucose, Bld: 153 mg/dL — ABNORMAL HIGH (ref 70–99)
Potassium: 3.1 mmol/L — ABNORMAL LOW (ref 3.5–5.1)
Sodium: 138 mmol/L (ref 135–145)

## 2021-12-05 LAB — ECHOCARDIOGRAM COMPLETE
AR max vel: 2.19 cm2
AV Area VTI: 2.07 cm2
AV Area mean vel: 2.2 cm2
AV Mean grad: 4 mmHg
AV Peak grad: 8.2 mmHg
Ao pk vel: 1.43 m/s
Area-P 1/2: 3.99 cm2
Height: 63 in
S' Lateral: 2.8 cm
Weight: 2906.54 oz

## 2021-12-05 LAB — URINALYSIS, ROUTINE W REFLEX MICROSCOPIC
Bilirubin Urine: NEGATIVE
Glucose, UA: NEGATIVE mg/dL
Ketones, ur: 5 mg/dL — AB
Leukocytes,Ua: NEGATIVE
Nitrite: NEGATIVE
Protein, ur: NEGATIVE mg/dL
Specific Gravity, Urine: 1.011 (ref 1.005–1.030)
pH: 6 (ref 5.0–8.0)

## 2021-12-05 LAB — MRSA NEXT GEN BY PCR, NASAL: MRSA by PCR Next Gen: NOT DETECTED

## 2021-12-05 LAB — CBC
HCT: 38.6 % (ref 36.0–46.0)
Hemoglobin: 13.3 g/dL (ref 12.0–15.0)
MCH: 29 pg (ref 26.0–34.0)
MCHC: 34.5 g/dL (ref 30.0–36.0)
MCV: 84.3 fL (ref 80.0–100.0)
Platelets: 205 10*3/uL (ref 150–400)
RBC: 4.58 MIL/uL (ref 3.87–5.11)
RDW: 13.2 % (ref 11.5–15.5)
WBC: 7.5 10*3/uL (ref 4.0–10.5)
nRBC: 0 % (ref 0.0–0.2)

## 2021-12-05 LAB — MAGNESIUM: Magnesium: 2.3 mg/dL (ref 1.7–2.4)

## 2021-12-05 MED ORDER — PANTOPRAZOLE SODIUM 40 MG PO TBEC
40.0000 mg | DELAYED_RELEASE_TABLET | Freq: Every day | ORAL | Status: DC
  Administered 2021-12-05 – 2021-12-06 (×2): 40 mg via ORAL
  Filled 2021-12-05 (×2): qty 1

## 2021-12-05 MED ORDER — SODIUM CHLORIDE 0.9 % IV SOLN: INTRAVENOUS | Status: DC | PRN

## 2021-12-05 MED ORDER — ACETAMINOPHEN 650 MG RE SUPP: 650.0000 mg | Freq: Four times a day (QID) | RECTAL | Status: DC | PRN

## 2021-12-05 MED ORDER — ALPRAZOLAM 0.5 MG PO TABS
0.5000 mg | ORAL_TABLET | Freq: Three times a day (TID) | ORAL | Status: DC | PRN
  Administered 2021-12-05 (×2): 0.5 mg via ORAL
  Filled 2021-12-05 (×2): qty 1

## 2021-12-05 MED ORDER — PAROXETINE HCL 20 MG PO TABS
40.0000 mg | ORAL_TABLET | Freq: Every day | ORAL | Status: DC
  Administered 2021-12-05 – 2021-12-06 (×2): 40 mg via ORAL
  Filled 2021-12-05 (×4): qty 2

## 2021-12-05 MED ORDER — MAGNESIUM SULFATE IN D5W 1-5 GM/100ML-% IV SOLN
1.0000 g | Freq: Once | INTRAVENOUS | Status: AC
  Administered 2021-12-05: 1 g via INTRAVENOUS
  Filled 2021-12-05: qty 100

## 2021-12-05 MED ORDER — ALPRAZOLAM 0.5 MG PO TABS: 0.5000 mg | ORAL_TABLET | Freq: Every evening | ORAL | Status: DC | PRN

## 2021-12-05 MED ORDER — POTASSIUM CHLORIDE CRYS ER 20 MEQ PO TBCR
30.0000 meq | EXTENDED_RELEASE_TABLET | ORAL | Status: AC
  Administered 2021-12-05 (×3): 30 meq via ORAL
  Filled 2021-12-05 (×3): qty 1

## 2021-12-05 MED ORDER — MELATONIN 5 MG PO TABS
5.0000 mg | ORAL_TABLET | Freq: Once | ORAL | Status: DC | PRN
  Filled 2021-12-05: qty 1

## 2021-12-05 MED ORDER — CHLORHEXIDINE GLUCONATE CLOTH 2 % EX PADS
6.0000 | MEDICATED_PAD | Freq: Every day | CUTANEOUS | Status: DC
  Administered 2021-12-05 – 2021-12-06 (×2): 6 via TOPICAL

## 2021-12-05 MED ORDER — ACETAMINOPHEN 325 MG PO TABS: 650.0000 mg | ORAL_TABLET | Freq: Four times a day (QID) | ORAL | Status: DC | PRN

## 2021-12-05 MED ORDER — METOPROLOL TARTRATE 25 MG PO TABS
50.0000 mg | ORAL_TABLET | Freq: Two times a day (BID) | ORAL | Status: DC
  Administered 2021-12-05 – 2021-12-06 (×2): 50 mg via ORAL
  Filled 2021-12-05 (×2): qty 2

## 2021-12-05 NOTE — Progress Notes (Signed)
Patient cardizem stopped for frequent pauses, drop in BP, and decreased HR. Metoprolol was given as scheduled. Provider notified of change. Will continue to monitor closely.

## 2021-12-05 NOTE — Progress Notes (Signed)
Echocardiogram 2D Echocardiogram has been performed.  Kristina Dougherty 12/05/2021, 10:55 AM

## 2021-12-05 NOTE — Progress Notes (Signed)
12/05/2021 Skin over sacrum intact however patient appears to have an anal fissure. Complains of occasional pain with bowel movements, uses cream on the area. Cindy S. Brigitte Pulse BSN, RN, [facility] 12/05/2021 6:49 AM

## 2021-12-05 NOTE — H&P (Signed)
History and Physical    Kristina Dougherty OQH:[id number] DOB: [birth year] DOA: 12/04/2021  PCP: Susy Frizzle, MD Patient coming from: Home  Chief Complaint: Anxiety  HPI: Kristina Dougherty is a 76 y.o. female with medical history significant of hypertension, anxiety, depression, tobacco use presented to the ED complaining of anxiety/ "nervous feeling in her stomach" and palpitations. Found to be in A. fib with RVR with rate in the 150s.  Not febrile.  Not hypoxic.  Labs showing WBC 15.6.  Hemoglobin normal.  Potassium 3.1.  Magnesium 1.8.  Lipase and LFTs normal.  High-sensitivity troponin negative x2.  TSH normal.  COVID and influenza PCR negative.  UA pending.  Chest x-ray showing no active disease. Patient was started on IV Cardizem and was given potassium supplement.  Cardiology consulted and recommended starting Eliquis 5 mg twice daily, echocardiogram in the morning.  Patient reports history of anxiety for several years for which she takes Paxil but still feels anxious sometimes.  States yesterday she was just doing chores at home/packing up Christmas items when all of a sudden she felt extremely anxious.  She describes it as "nerves in my stomach."  She thought her symptoms were related to her blood pressure being high so she took her home medications but continued to feel anxious which prompted her to come into the emergency room to be evaluated.  States something like this has never happened to her before as usually her anxiety does not last this long.  She did not have any heart palpitations, shortness of breath, or chest pain yesterday but does report intermittent episodes of heart palpitations in the past which she thinks has been going on for several months.  She denies history of atrial fibrillation but does report being seen by a cardiologist several years ago and being told that she had "a hole in my heart" but she has never had any cardiac surgeries.  No other complaints.  Denies  fevers or cough.  Denies nausea, vomiting, abdominal pain, diarrhea, dysuria, or urinary frequency/urgency.  States she no longer feels anxious after receiving medications in the emergency room.  Review of Systems:  All systems reviewed and apart from history of presenting illness, are negative.  Past Medical History:  Diagnosis Date   Anxiety    Arthritis    Depression    Hypertension    Osteopenia     Past Surgical History:  Procedure Laterality Date   ABDOMINAL HYSTERECTOMY     APPENDECTOMY       reports that she has been smoking cigarettes. She has been smoking an average of .3 packs per day. She has never used smokeless tobacco. She reports that she does not drink alcohol and does not use drugs.  Allergies  Allergen Reactions   Morphine And Related     Severe HA per pt   Nitrofurantoin Nausea And Vomiting    Family History  Problem Relation Age of Onset   Hypertension Maternal Grandmother     Prior to Admission medications   Medication Sig Start Date End Date Taking? Authorizing Provider  ALPRAZolam (XANAX) 0.5 MG tablet TAKE 1 TABLET BY MOUTH THREE TIMES DAILY AS NEEDED FOR ANXIETY OR SLEEP Patient taking differently: Take 0.5 mg by mouth at bedtime as needed for sleep. 09/25/19  Yes Susy Frizzle, MD  amLODipine (NORVASC) 10 MG tablet TAKE 1 TABLET BY MOUTH  DAILY Patient taking differently: Take 10 mg by mouth daily. 08/08/21  Yes Susy Frizzle, MD  aspirin 81 MG tablet Take 81 mg by mouth daily.     Yes [provider]  hydrochlorothiazide (MICROZIDE) 12.5 MG capsule TAKE 1 CAPSULE BY MOUTH  DAILY Patient taking differently: Take 12.5 mg by mouth daily. 08/08/21  Yes Susy Frizzle, MD  losartan (COZAAR) 50 MG tablet TAKE 1 TABLET BY MOUTH ONCE DAILY Patient taking differently: Take 50 mg by mouth See admin instructions. TAKE 1 TABLET BY MOUTH ONCE DAILY 08/08/21  Yes Susy Frizzle, MD  melatonin 1 MG TABS tablet Take 1 mg by mouth at bedtime  as needed (sleep).   Yes [provider]  meloxicam (MOBIC) 15 MG tablet TAKE 1 TABLET BY MOUTH  DAILY Patient taking differently: Take 15 mg by mouth 2 (two) times a week. 03/22/21  Yes Susy Frizzle, MD  Multiple Vitamin (MULITIVITAMIN WITH MINERALS) TABS Take 1 tablet by mouth daily.   Yes [provider]  pantoprazole (PROTONIX) 40 MG tablet TAKE 1 TABLET BY MOUTH  DAILY Patient taking differently: 40 mg daily. 08/08/21  Yes Susy Frizzle, MD  PARoxetine (PAXIL) 20 MG tablet TAKE 1 TABLET BY MOUTH  TWICE DAILY Patient taking differently: Take 40 mg by mouth daily. 08/08/21  Yes Susy Frizzle, MD  alendronate (FOSAMAX) 70 MG tablet TAKE 1 TABLET EVERY 7 DAYS. TAKE WITH A FULL GLASS OF WATER ON AN EMPTY STOMACH. Patient not taking: No sig reported 02/11/18   Susy Frizzle, MD  clobetasol cream (TEMOVATE) 2.69 % APPLY ONE APPLICATION TOPICALLY TWO TIMES DAILY Patient taking differently: Apply 1 application topically See admin instructions. APPLY ONE APPLICATION TOPICALLY TWO TIMES DAILY prn 03/03/19   Susy Frizzle, MD    Physical Exam: Vitals:   12/05/21 0100 12/05/21 0200 12/05/21 0300 12/05/21 0400  BP: 118/83 113/71 120/72 108/87  Pulse: 100 82 70 79  Resp: 14 14 13 13   Temp:      TempSrc:      SpO2: 97% 96% 95% 96%  Weight: 82.4 kg     Height: 5\' 3"  (1.6 m)       Physical Exam Constitutional:      General: She is not in acute distress. HENT:     Head: Normocephalic and atraumatic.  Eyes:     Extraocular Movements: Extraocular movements intact.     Conjunctiva/sclera: Conjunctivae normal.  Cardiovascular:     Rate and Rhythm: Tachycardia present. Rhythm irregular.     Pulses: Normal pulses.  Pulmonary:     Effort: Pulmonary effort is normal. No respiratory distress.     Breath sounds: Normal breath sounds. No wheezing or rales.  Abdominal:     General: Bowel sounds are normal. There is no distension.     Palpations: Abdomen is soft.      Tenderness: There is no abdominal tenderness. There is no guarding or rebound.  Musculoskeletal:        General: No swelling or tenderness.     Cervical back: Normal range of motion and neck supple.  Skin:    General: Skin is warm and dry.  Neurological:     General: No focal deficit present.     Mental Status: She is alert and oriented to person, place, and time.     Labs on Admission: I have personally reviewed following labs and imaging studies  CBC: Recent Labs  Lab 12/04/21 1954  WBC 15.6*  NEUTROABS 12.3*  HGB 14.1  HCT 41.5  MCV 84.0  PLT 232   Basic  Metabolic Panel: Recent Labs  Lab 12/04/21 1954 12/04/21 2109  NA 138  --   K 3.1*  --   CL 100  --   CO2 27  --   GLUCOSE 110*  --   BUN 19  --   CREATININE 1.05*  --   CALCIUM 9.5  --   MG  --  1.8   GFR: Estimated Creatinine Clearance: 45.6 mL/min (A) (by C-G formula based on SCr of 1.05 mg/dL (H)). Liver Function Tests: Recent Labs  Lab 12/04/21 1954  AST 24  ALT 31  ALKPHOS 63  BILITOT 0.9  PROT 7.0  ALBUMIN 4.2   Recent Labs  Lab 12/04/21 1954  LIPASE 33   No results for input(s): AMMONIA in the last 168 hours. Coagulation Profile: No results for input(s): INR, PROTIME in the last 168 hours. Cardiac Enzymes: No results for input(s): CKTOTAL, CKMB, CKMBINDEX, TROPONINI in the last 168 hours. BNP (last 3 results) No results for input(s): PROBNP in the last 8760 hours. HbA1C: No results for input(s): HGBA1C in the last 72 hours. CBG: Recent Labs  Lab 12/04/21 2040  GLUCAP 109*   Lipid Profile: No results for input(s): CHOL, HDL, LDLCALC, TRIG, CHOLHDL, LDLDIRECT in the last 72 hours. Thyroid Function Tests: Recent Labs    12/04/21 2109  TSH 3.499   Anemia Panel: No results for input(s): VITAMINB12, FOLATE, FERRITIN, TIBC, IRON, RETICCTPCT in the last 72 hours. Urine analysis:    Component Value Date/Time   COLORURINE YELLOW 05/05/2016 [place] 05/05/2016 1451    LABSPEC 1.005 05/05/2016 1451   PHURINE 5.5 05/05/2016 1451   GLUCOSEU NEGATIVE 05/05/2016 1451   HGBUR TRACE (A) 05/05/2016 1451   HGBUR negative 11/25/2009 1508   BILIRUBINUR NEGATIVE 05/05/2016 1451   BILIRUBINUR n 01/26/2011 0000   KETONESUR NEGATIVE 05/05/2016 1451   PROTEINUR NEGATIVE 05/05/2016 1451   UROBILINOGEN 0.2 04/15/2015 1412   NITRITE NEGATIVE 05/05/2016 1451   LEUKOCYTESUR 3+ (A) 05/05/2016 1451    Radiological Exams on Admission: DG Chest Portable 1 View  Result Date: 12/05/2021 CLINICAL DATA:  Atrial fibrillation EXAM: PORTABLE CHEST 1 VIEW COMPARISON:  None. FINDINGS: The heart size and mediastinal contours are within normal limits. Both lungs are clear. The visualized skeletal structures are unremarkable. IMPRESSION: No active disease. Electronically Signed   By: Ulyses Jarred M.D.   On: 12/05/2021 00:04    EKG: Independently reviewed.  A. fib with RVR.  Assessment/Plan Principal Problem:   Atrial fibrillation with rapid ventricular response (HCC) Active Problems:   ANXIETY DEPRESSION   Essential hypertension   Leukocytosis   Hypokalemia   New onset A. fib with RVR Rate initially in the 150s and was started on IV Cardizem, now improved to 100-110 but continues to be in A. fib.  She has mild leukocytosis on labs, ?Infection as a precipitating factor.  TSH normal.  Potassium only slightly low.  Magnesium 1.8.  PE less likely given no hypoxia.  CHA2DS2-VASc 4 and started on Eliquis 5 mg twice daily per cardiology recommendation. -Cardiac monitoring.  Continue IV Cardizem and Eliquis.  Echocardiogram ordered.  Replace potassium, keep magnesium >2.   Mild leukocytosis Not febrile.  Chest x-ray not suggestive of pneumonia.  Not endorsing any infectious symptoms. -UA pending.  Repeat CBC this morning.  Mild hypokalemia -Monitor potassium and magnesium levels, replace if low.  Hypertension -On Cardizem drip at this time, hold oral antihypertensives  Anxiety,  depression -Continue Paxil  GERD -Continue Protonix  DVT prophylaxis: Eliquis Code Status: Patient wishes to be full code. Family Communication: No family available at this time. Disposition Plan: Status is: Observation  The patient remains OBS appropriate and will d/c before 2 midnights.  Level of care: Level of care: Stepdown  The medical decision making on this patient was of high complexity and the patient is at high risk for clinical deterioration, therefore this is a level 3 visit.  Shela Leff MD [facility]  If 7PM-7AM, please contact night-coverage [email or web address]  12/05/2021, 4:22 AM

## 2021-12-05 NOTE — Progress Notes (Addendum)
PROGRESS NOTE    Kristina Dougherty  QVZ:[id number] DOB: [birth year] DOA: 12/04/2021 PCP: Susy Frizzle, MD    Brief Narrative:  Kristina Dougherty is a 76 year old female with past medical history significant for essential hypertension, anxiety/depression, tobacco use disorder who presents to [facility] ED on 1/8 with complaints of anxiety, palpitations, and "nervous feeling in her stomach".  Patient reports history of anxiety for several years in which she takes Paxil.  Wilburn Mylar was doing some chores at home/packing up Christmas items when all of a sudden she felt extremely anxious.  She thought her symptoms were related to her blood pressure being so high, so she took her home antihypertensives but still continued to feel anxious which prompted her to come to the ED for further evaluation.  No previous episodes and similarity prior.  Denies shortness of breath, no chest pain, no nausea/vomiting/diarrhea, no abdominal pain, no dysuria, no urinary frequency/urgency.  In the ED, 98.8 F, HR 150, RR 20, BP 99/56.  Sodium 138, potassium 3.1, chloride 100, CO2 27, glucose 110, BUN 19, creatinine 1.05, AST 24, ALT 31, total bilirubin 0.9.  Lipase 33.  WBC 15.6, hemoglobin 14.1, platelets 232.  COVID-19 PCR negative.  Influenza A/B PCR negative.  High sensitive troponin 5>6, within normal limits.  Urinalysis unrevealing.  Chest x-ray with no active cardiopulmonary disease process.  EKG with atrial fibrillation, rate 145, QTc 426, no concerning dynamic changes.  He was consulted.  Hospital service consulted for further evaluation management of new onset atrial fibrillation with RVR.   Assessment & Plan:   Principal Problem:   Atrial fibrillation with rapid ventricular response (HCC) Active Problems:   ANXIETY DEPRESSION   Essential hypertension   Leukocytosis   Hypokalemia   Atrial fibrillation with RVR, new diagnosis Patient presenting to the ED with acute onset palpitations.  Was found to be in A. fib  with RVR, new diagnosis.  TSH 3.499, within normal limits.  CHA2DS2-VASc = 4.  --Cardiology following, appreciate assistance --TTE: Pending --Cardizem drip; possible transition to Cardizem CD 180mg  vs metoprolol 50mg  BID once echo complete per cardiology --Eliquis 5 mg p.o. twice daily for anticoagulation --Cardiology to consider cardioversion outpatient in 3 weeks --Continue monitor on telemetry  Hypokalemia Hypomagnesemia Potassium 3.1 this morning, will replete.  Check magnesium level. --Monitor electrolytes closely daily  Leukocytosis: Resolved WBC count elevated 15.6 on admission, likely reactive in the setting of A. fib with RVR.  No infectious etiology elucidated with negative chest x-ray and normal urinalysis.  Repeat WBC count today 7.5, now resolved.  No antibiotics were received.  Essential hypertension On hydrochlorothiazide, 12.5 mg p.o. daily, losartan 50 mg p.o. daily, amlodipine 10 mg p.o. daily at home. --Holding home antihypertensives while on Cardizem drip given borderline hypotension --Continue monitor BP closely  Anxiety/depression: --Paxil 20 mg p.o. daily --Xanax 0.5 mg p.o. nightly as needed for sleep  GERD: Protonix 40 mg p.o. daily  Tobacco use disorder: Counseled on need for tobacco cessation.   DVT prophylaxis: apixaban (ELIQUIS) tablet 5 mg    Code Status: Full Code Family Communication: Family present at bedside this morning  Disposition Plan:  Level of care: Stepdown Status is: Observation  The patient remains OBS appropriate and will d/c before 2 midnights.    Consultants:  Cardiology  Procedures:  TTE: Pending  Antimicrobials:  None   Subjective: Patient seen examined at bedside, resting comfortably.  RN present.  Remains on Cardizem drip.  Heart rate now controlled.  Awaiting TTE.  Seen  by cardiology this morning.  No complaints or concerns at this time.  Denies any further palpitations at this time.  Further denies headache, no  visual changes, no chest pain, no abdominal pain, no cough/congestion, no fever/chills/night sweats, no nausea/vomiting/diarrhea, no weakness, no fatigue, no paresthesias.  No acute events overnight per nursing staff.  Objective: Vitals:   12/05/21 0500 12/05/21 0600 12/05/21 0700 12/05/21 0817  BP: 104/67  108/70   Pulse: (!) 107 80 91   Resp: 17 16 15    Temp:    97.6 F (36.4 C)  TempSrc:    Oral  SpO2: 97% 97% 95%   Weight:      Height:        Intake/Output Summary (Last 24 hours) at 12/05/2021 0854 Last data filed at 12/05/2021 0600 Gross per 24 hour  Intake 124.54 ml  Output 650 ml  Net -525.46 ml   Filed Weights   12/04/21 1942 12/05/21 0100  Weight: 81.6 kg 82.4 kg    Examination:  General exam: Appears calm and comfortable  Respiratory system: Clear to auscultation. Respiratory effort normal.  On room air Cardiovascular system: S1 & S2 heard, irregularly irregular rhythm, normal rate. No JVD, murmurs, rubs, gallops or clicks.  Trace bilateral pedal edema. Gastrointestinal system: Abdomen is nondistended, soft and nontender. No organomegaly or masses felt. Normal bowel sounds heard. Central nervous system: Alert and oriented. No focal neurological deficits. Extremities: Symmetric 5 x 5 power. Skin: No rashes, lesions or ulcers Psychiatry: Judgement and insight appear normal. Mood & affect appropriate.     Data Reviewed: I have personally reviewed following labs and imaging studies  CBC: Recent Labs  Lab 12/04/21 1954 12/05/21 0646  WBC 15.6* 7.5  NEUTROABS 12.3*  --   HGB 14.1 13.3  HCT 41.5 38.6  MCV 84.0 84.3  PLT 232 827   Basic Metabolic Panel: Recent Labs  Lab 12/04/21 1954 12/04/21 2109 12/05/21 0646  NA 138  --  138  K 3.1*  --  3.1*  CL 100  --  103  CO2 27  --  26  GLUCOSE 110*  --  153*  BUN 19  --  12  CREATININE 1.05*  --  0.74  CALCIUM 9.5  --  8.8*  MG  --  1.8 2.3   GFR: Estimated Creatinine Clearance: 59.9 mL/min (by C-G  formula based on SCr of 0.74 mg/dL). Liver Function Tests: Recent Labs  Lab 12/04/21 1954  AST 24  ALT 31  ALKPHOS 63  BILITOT 0.9  PROT 7.0  ALBUMIN 4.2   Recent Labs  Lab 12/04/21 1954  LIPASE 33   No results for input(s): AMMONIA in the last 168 hours. Coagulation Profile: No results for input(s): INR, PROTIME in the last 168 hours. Cardiac Enzymes: No results for input(s): CKTOTAL, CKMB, CKMBINDEX, TROPONINI in the last 168 hours. BNP (last 3 results) No results for input(s): PROBNP in the last 8760 hours. HbA1C: No results for input(s): HGBA1C in the last 72 hours. CBG: Recent Labs  Lab 12/04/21 2040  GLUCAP 109*   Lipid Profile: No results for input(s): CHOL, HDL, LDLCALC, TRIG, CHOLHDL, LDLDIRECT in the last 72 hours. Thyroid Function Tests: Recent Labs    12/04/21 2109  TSH 3.499   Anemia Panel: No results for input(s): VITAMINB12, FOLATE, FERRITIN, TIBC, IRON, RETICCTPCT in the last 72 hours. Sepsis Labs: No results for input(s): PROCALCITON, LATICACIDVEN in the last 168 hours.  Recent Results (from the past 240 hour(s))  Resp Panel  by RT-PCR (Flu A&B, Covid) Nasopharyngeal Swab     Status: None   Collection Time: 12/04/21  9:46 PM   Specimen: Nasopharyngeal Swab; Nasopharyngeal(NP) swabs in vial transport medium  Result Value Ref Range Status   SARS Coronavirus 2 by RT PCR NEGATIVE NEGATIVE Final    Comment: (NOTE) SARS-CoV-2 target nucleic acids are NOT DETECTED.  The SARS-CoV-2 RNA is generally detectable in upper respiratory specimens during the acute phase of infection. The lowest concentration of SARS-CoV-2 viral copies this assay can detect is 138 copies/mL. A negative result does not preclude SARS-Cov-2 infection and should not be used as the sole basis for treatment or other patient management decisions. A negative result may occur with  improper specimen collection/handling, submission of specimen other than nasopharyngeal swab, presence  of viral mutation(s) within the areas targeted by this assay, and inadequate number of viral copies(<138 copies/mL). A negative result must be combined with clinical observations, patient history, and epidemiological information. The expected result is Negative.  Fact Sheet for Patients:  EntrepreneurPulse.com.au  Fact Sheet for Healthcare Providers:  IncredibleEmployment.be  This test is no t yet approved or cleared by the [place] FDA and  has been authorized for detection and/or diagnosis of SARS-CoV-2 by FDA under an Emergency Use Authorization (EUA). This EUA will remain  in effect (meaning this test can be used) for the duration of the COVID-19 declaration under Section 564(b)(1) of the Act, 21 U.S.C.section 360bbb-3(b)(1), unless the authorization is terminated  or revoked sooner.       Influenza A by PCR NEGATIVE NEGATIVE Final   Influenza B by PCR NEGATIVE NEGATIVE Final    Comment: (NOTE) The Xpert Xpress SARS-CoV-2/FLU/RSV plus assay is intended as an aid in the diagnosis of influenza from Nasopharyngeal swab specimens and should not be used as a sole basis for treatment. Nasal washings and aspirates are unacceptable for Xpert Xpress SARS-CoV-2/FLU/RSV testing.  Fact Sheet for Patients: EntrepreneurPulse.com.au  Fact Sheet for Healthcare Providers: IncredibleEmployment.be  This test is not yet approved or cleared by the [place] FDA and has been authorized for detection and/or diagnosis of SARS-CoV-2 by FDA under an Emergency Use Authorization (EUA). This EUA will remain in effect (meaning this test can be used) for the duration of the COVID-19 declaration under Section 564(b)(1) of the Act, 21 U.S.C. section 360bbb-3(b)(1), unless the authorization is terminated or revoked.  Performed at [facility], [place] [address], [place], [place] [address]   MRSA Next Gen  by PCR, Nasal     Status: None   Collection Time: 12/05/21 12:26 AM   Specimen: Nasal Mucosa; Nasal Swab  Result Value Ref Range Status   MRSA by PCR Next Gen NOT DETECTED NOT DETECTED Final    Comment: (NOTE) The GeneXpert MRSA Assay (FDA approved for NASAL specimens only), is one component of a comprehensive MRSA colonization surveillance program. It is not intended to diagnose MRSA infection nor to guide or monitor treatment for MRSA infections. Test performance is not FDA approved in patients less than 22 years old. Performed at [facility], [place] [address], [place], [place] [address]          Radiology Studies: DG Chest Portable 1 View  Result Date: 12/05/2021 CLINICAL DATA:  Atrial fibrillation EXAM: PORTABLE CHEST 1 VIEW COMPARISON:  None. FINDINGS: The heart size and mediastinal contours are within normal limits. Both lungs are clear. The visualized skeletal structures are unremarkable. IMPRESSION: No active disease. Electronically Signed   By:  Ulyses Jarred M.D.   On: 12/05/2021 00:04        Scheduled Meds:  apixaban  5 mg Oral BID   Chlorhexidine Gluconate Cloth  6 each Topical Daily   pantoprazole  40 mg Oral Daily   PARoxetine  40 mg Oral Daily   potassium chloride  30 mEq Oral Q3H   Continuous Infusions:  sodium chloride Stopped (12/05/21 0545)   diltiazem (CARDIZEM) infusion 7.5 mg/hr (12/05/21 0600)     LOS: 0 days    Time spent: 41 minutes spent on chart review, discussion with nursing staff, consultants, updating family and interview/physical exam; more than 50% of that time was spent in counseling and/or coordination of care.    Lakiyah Arntson J [place] DO [facility] Available via Epic secure chat 7am-7pm After these hours, please refer to coverage provider listed on [email or web address] 12/05/2021, 8:54 AM

## 2021-12-05 NOTE — Consult Note (Signed)
Cardiology Consultation:   Patient ID: MASHELL Dougherty MRN: [id number]; DOB: 02-Sep-1946  Admit date: 12/04/2021 Date of Consult: 12/05/2021  PCP:  Susy Frizzle, MD   [place] Providers Cardiologist:  New to [facility] - Dr. Debara Pickett     Patient Profile:   Kristina Dougherty is a 76 y.o. female with a hx of hypertension, anxiety/depression, and tobacco use who is being seen 12/05/2021 for the evaluation of new atrial fibrillation at the request of Dr. [place]  History of Present Illness:   [name] is a 76 year old female with past medical history of hypertension, anxiety/depression, and tobacco use.  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  She does not remember who the cardiologist is.  She says she did not have any symptom associated with it.  In the past, she had episodes of nervousness however never last very long.  While she was packing up her Christmas items on Friday night, she started having nervousness in her stomach with intermittent nausea and vomiting.  Symptoms did not go away this time, this prompted the patient to seek medical attention at [facility]. She denies any recent exertional chest pain, worsening shortness of breath, dizziness or fatigue.   On arrival to [facility] ED, she was noted to be in new atrial fibrillation with RVR.  White blood cell count elevated at 15.6.  Serial troponin was negative.  Potassium low at 3.1.  TSH was normal.  Viral panel negative.  Chest x-ray normal.  Despite mildly elevated white blood cell count, patient does not have any sign of infection, she was not given any antibiotic.  By next morning, her white blood cell count normalized.  He was admitted to hospitalist service, hospitalist service spoke with on-call cardiology fellow who recommended starting the patient on Eliquis 5 mg twice a day.  She was also started on IV Cardizem for rate control.    Past Medical History:   Diagnosis Date   Anxiety    Arthritis    Depression    Hypertension    Osteopenia     Past Surgical History:  Procedure Laterality Date   ABDOMINAL HYSTERECTOMY     APPENDECTOMY       Home Medications:  Prior to Admission medications   Medication Sig Start Date End Date Taking? Authorizing Provider  ALPRAZolam (XANAX) 0.5 MG tablet TAKE 1 TABLET BY MOUTH THREE TIMES DAILY AS NEEDED FOR ANXIETY OR SLEEP Patient taking differently: Take 0.5 mg by mouth at bedtime as needed for sleep. 09/25/19  Yes Susy Frizzle, MD  amLODipine (NORVASC) 10 MG tablet TAKE 1 TABLET BY MOUTH  DAILY Patient taking differently: Take 10 mg by mouth daily. 08/08/21  Yes Susy Frizzle, MD  aspirin 81 MG tablet Take 81 mg by mouth daily.     Yes [provider]  hydrochlorothiazide (MICROZIDE) 12.5 MG capsule TAKE 1 CAPSULE BY MOUTH  DAILY Patient taking differently: Take 12.5 mg by mouth daily. 08/08/21  Yes Susy Frizzle, MD  losartan (COZAAR) 50 MG tablet TAKE 1 TABLET BY MOUTH ONCE DAILY Patient taking differently: Take 50 mg by mouth See admin instructions. TAKE 1 TABLET BY MOUTH ONCE DAILY 08/08/21  Yes Susy Frizzle, MD  melatonin 1 MG TABS tablet Take 1 mg by mouth at bedtime as needed (sleep).   Yes [provider]  meloxicam (MOBIC) 15 MG tablet TAKE 1 TABLET BY  MOUTH  DAILY Patient taking differently: Take 15 mg by mouth 2 (two) times a week. 03/22/21  Yes Susy Frizzle, MD  Multiple Vitamin (MULITIVITAMIN WITH MINERALS) TABS Take 1 tablet by mouth daily.   Yes [provider]  pantoprazole (PROTONIX) 40 MG tablet TAKE 1 TABLET BY MOUTH  DAILY Patient taking differently: 40 mg daily. 08/08/21  Yes Susy Frizzle, MD  PARoxetine (PAXIL) 20 MG tablet TAKE 1 TABLET BY MOUTH  TWICE DAILY Patient taking differently: Take 40 mg by mouth daily. 08/08/21  Yes Susy Frizzle, MD  alendronate (FOSAMAX) 70 MG tablet TAKE 1 TABLET EVERY 7 DAYS. TAKE WITH A FULL  GLASS OF WATER ON AN EMPTY STOMACH. Patient not taking: No sig reported 02/11/18   Susy Frizzle, MD  clobetasol cream (TEMOVATE) 4.19 % APPLY ONE APPLICATION TOPICALLY TWO TIMES DAILY Patient taking differently: Apply 1 application topically See admin instructions. APPLY ONE APPLICATION TOPICALLY TWO TIMES DAILY prn 03/03/19   Susy Frizzle, MD    Inpatient Medications: Scheduled Meds:  apixaban  5 mg Oral BID   Chlorhexidine Gluconate Cloth  6 each Topical Daily   pantoprazole  40 mg Oral Daily   PARoxetine  40 mg Oral Daily   potassium chloride  30 mEq Oral Q3H   Continuous Infusions:  sodium chloride Stopped (12/05/21 0545)   diltiazem (CARDIZEM) infusion 7.5 mg/hr (12/05/21 0600)   PRN Meds: sodium chloride, acetaminophen **OR** acetaminophen, melatonin  Allergies:    Allergies  Allergen Reactions   Morphine And Related     Severe HA per pt   Nitrofurantoin Nausea And Vomiting    Social History:   Social History   Socioeconomic History   Marital status: Divorced    Spouse name: Not on file   Number of children: Not on file   Years of education: Not on file   Highest education level: Not on file  Occupational History   Not on file  Tobacco Use   Smoking status: Every Day    Packs/day: 0.30    Types: Cigarettes    Last attempt to quit: 08/28/2011    Years since quitting: 10.2   Smokeless tobacco: Never  Substance and Sexual Activity   Alcohol use: No   Drug use: No   Sexual activity: Never  Other Topics Concern   Not on file  Social History Narrative   Not on file   Social Determinants of Health   Financial Resource Strain: Not on file  Food Insecurity: Not on file  Transportation Needs: Not on file  Physical Activity: Not on file  Stress: Not on file  Social Connections: Not on file  Intimate Partner Violence: Not on file    Family History:    Family History  Problem Relation Age of Onset   Hypertension Maternal Grandmother      ROS:   Please see the history of present illness.   All other ROS reviewed and negative.     Physical Exam/Data:   Vitals:   12/05/21 0500 12/05/21 0600 12/05/21 0700 12/05/21 0817  BP: 104/67  108/70   Pulse: (!) 107 80 91   Resp: 17 16 15    Temp:    97.6 F (36.4 C)  TempSrc:    Oral  SpO2: 97% 97% 95%   Weight:      Height:        Intake/Output Summary (Last 24 hours) at 12/05/2021 0853 Last data filed at 12/05/2021 0600 Gross per 24 hour  Intake 124.54 ml  Output 650 ml  Net -525.46 ml   Last 3 Weights 12/05/2021 12/04/2021 04/12/2018  Weight (lbs) 181 lb 10.5 oz 180 lb 176 lb  Weight (kg) 82.4 kg 81.647 kg 79.833 kg     Body mass index is 32.18 kg/m.  General:  Well nourished, well developed, in no acute distress HEENT: normal Neck: no JVD Vascular: No carotid bruits; Distal pulses 2+ bilaterally Cardiac:  normal S1, S2; irregularly irregular; no murmur  Lungs:  clear to auscultation bilaterally, no wheezing, rhonchi or rales  Abd: soft, nontender, no hepatomegaly  Ext: no edema Musculoskeletal:  No deformities, BUE and BLE strength normal and equal Skin: warm and dry  Neuro:  CNs 2-12 intact, no focal abnormalities noted Psych:  Normal affect   EKG:  The EKG was personally reviewed and demonstrates: Atrial fibrillation with RVR Telemetry:  Telemetry was personally reviewed and demonstrates: Atrial fibrillation, heart rate borderline in the 90s to 100 range.  2 episodes of pauses overnight, longest episode was 2.3 seconds (the other one 2.1 seconds).  Relevant CV Studies:  Pending echo  Laboratory Data:  High Sensitivity Troponin:   Recent Labs  Lab 12/04/21 1954 12/04/21 2131  TROPONINIHS 5 6     Chemistry Recent Labs  Lab 12/04/21 1954 12/04/21 2109 12/05/21 0646  NA 138  --  138  K 3.1*  --  3.1*  CL 100  --  103  CO2 27  --  26  GLUCOSE 110*  --  153*  BUN 19  --  12  CREATININE 1.05*  --  0.74  CALCIUM 9.5  --  8.8*  MG  --  1.8 2.3  GFRNONAA  55*  --  >60  ANIONGAP 11  --  9    Recent Labs  Lab 12/04/21 1954  PROT 7.0  ALBUMIN 4.2  AST 24  ALT 31  ALKPHOS 63  BILITOT 0.9   Lipids No results for input(s): CHOL, TRIG, HDL, LABVLDL, LDLCALC, CHOLHDL in the last 168 hours.  Hematology Recent Labs  Lab 12/04/21 1954 12/05/21 0646  WBC 15.6* 7.5  RBC 4.94 4.58  HGB 14.1 13.3  HCT 41.5 38.6  MCV 84.0 84.3  MCH 28.5 29.0  MCHC 34.0 34.5  RDW 13.0 13.2  PLT 232 205   Thyroid  Recent Labs  Lab 12/04/21 2109  TSH 3.499    BNPNo results for input(s): BNP, PROBNP in the last 168 hours.  DDimer No results for input(s): DDIMER in the last 168 hours.   Radiology/Studies:  DG Chest Portable 1 View  Result Date: 12/05/2021 CLINICAL DATA:  Atrial fibrillation EXAM: PORTABLE CHEST 1 VIEW COMPARISON:  None. FINDINGS: The heart size and mediastinal contours are within normal limits. Both lungs are clear. The visualized skeletal structures are unremarkable. IMPRESSION: No active disease. Electronically Signed   By: Ulyses Jarred M.D.   On: 12/05/2021 00:04     Assessment and Plan:   Atrial fibrillation with RVR   -Presented with prolonged episode of nervousness in the stomach.  Otherwise denies any awareness of palpitation, fatigue or shortness of breath.  -TSH normal.  Serial troponin negative.  Pending echocardiogram  -Started on Eliquis 5 mg twice a day and IV Cardizem.  Longest pause 2.3 seconds around 5 AM this morning.  Asymptomatic.  - Consider switch IV Cardizem to 180 mg p.o. Diltiazem CD daily today or 50mg  BID metoprolol  -She currently has no awareness of atrial fibrillation.  As long as  her echocardiogram is normal and she can ambulate without her heart rate going up too fast, it may be reasonable to discharge tomorrow and plan for cardioversion in 3 weeks. Should be stable to go to telemetry today.   Hypertension: On hydrochlorothiazide, losartan and amlodipine at home.  Continue to hold blood pressure  medications from home.  Her blood pressure is borderline on the current dose of IV diltiazem.  Leukocytosis: White blood cell count 15 on arrival, patient does not have any signs of infection, repeat white blood cell count a day later was normal.  Patient did not receive any antibiotic.  Hypokalemia: Repleted     Risk Assessment/Risk Scores:          CHA2DS2-VASc Score = 4   This indicates a 4.8% annual risk of stroke. The patient's score is based upon: CHF History: 0 HTN History: 1 Diabetes History: 0 Stroke History: 0 Vascular Disease History: 0 Age Score: 2 Gender Score: 1         For questions or updates, please contact [place] Please consult [email or web address] for contact info under    Hilbert Corrigan, [place]  12/05/2021 8:53 AM

## 2021-12-06 ENCOUNTER — Other Ambulatory Visit (HOSPITAL_COMMUNITY): Payer: Self-pay

## 2021-12-06 DIAGNOSIS — I10 Essential (primary) hypertension: Secondary | ICD-10-CM | POA: Diagnosis not present

## 2021-12-06 DIAGNOSIS — I4891 Unspecified atrial fibrillation: Secondary | ICD-10-CM | POA: Diagnosis not present

## 2021-12-06 LAB — BASIC METABOLIC PANEL
Anion gap: 8 (ref 5–15)
BUN: 19 mg/dL (ref 8–23)
CO2: 26 mmol/L (ref 22–32)
Calcium: 9 mg/dL (ref 8.9–10.3)
Chloride: 105 mmol/L (ref 98–111)
Creatinine, Ser: 0.9 mg/dL (ref 0.44–1.00)
GFR, Estimated: >60 mL/min (ref >60)
Glucose, Bld: 106 mg/dL — ABNORMAL HIGH (ref 70–99)
Potassium: 4.6 mmol/L (ref 3.5–5.1)
Sodium: 139 mmol/L (ref 135–145)

## 2021-12-06 LAB — MAGNESIUM: Magnesium: 2.3 mg/dL (ref 1.7–2.4)

## 2021-12-06 MED ORDER — METOPROLOL TARTRATE 50 MG PO TABS: 50.0000 mg | ORAL_TABLET | Freq: Two times a day (BID) | ORAL | 2 refills | Status: DC

## 2021-12-06 MED ORDER — METOPROLOL TARTRATE 50 MG PO TABS
50.0000 mg | ORAL_TABLET | Freq: Two times a day (BID) | ORAL | 2 refills | Status: DC
  Filled 2021-12-06: qty 60, 30d supply, fill #0

## 2021-12-06 MED ORDER — SODIUM CHLORIDE 0.9 % IV BOLUS
250.0000 mL | Freq: Once | INTRAVENOUS | Status: AC
  Administered 2021-12-06: 250 mL via INTRAVENOUS

## 2021-12-06 MED ORDER — APIXABAN 5 MG PO TABS
5.0000 mg | ORAL_TABLET | Freq: Two times a day (BID) | ORAL | 2 refills | Status: DC
  Filled 2021-12-06: qty 60, 30d supply, fill #0

## 2021-12-06 MED ORDER — APIXABAN 5 MG PO TABS: 5.0000 mg | ORAL_TABLET | Freq: Two times a day (BID) | ORAL | 2 refills | Status: DC

## 2021-12-06 NOTE — Plan of Care (Signed)
I have reviewed this patients AVS, transportation will be called so the patient can return to there independent living facility.   Patient has no further question at this time.

## 2021-12-06 NOTE — TOC CM/SW Note (Signed)
°  Transition of Care [facility]) Screening Note   Patient Details  Name: Kristina Dougherty Date of Birth: 14-Jun-1946   Transition of Care [facility]) CM/SW Contact:    Ross Ludwig, LCSW Phone Number: 12/06/2021, 2:12 PM    Transition of Care Department [facility]) has reviewed patient and no TOC needs have been identified at this time. We will continue to monitor patient advancement through interdisciplinary progression rounds. If new patient transition needs arise, please place a TOC consult.

## 2021-12-06 NOTE — Progress Notes (Addendum)
DAILY PROGRESS NOTE   Patient Name: Kristina Dougherty Date of Encounter: 12/06/2021 Cardiologist: None  Chief Complaint   No further dizziness  Patient Profile   Kristina Dougherty is a 76 y.o. female with a hx of hypertension, anxiety/depression, and tobacco use who is being seen 12/05/2021 for the evaluation of new atrial fibrillation at the request of Dr. [place]  Subjective   Blood pressure is noted to be soft overnight.  Recheck blood pressure this morning was around 118/95.  She reports less dizziness.  In general heart rate is pretty well controlled on metoprolol with some occasional increases up into the 120s with exertion today. She was previously on amlodipine, losartan and HCTZ - these are currently being held.  Objective   Vitals:   12/06/21 0800 12/06/21 0900 12/06/21 0902 12/06/21 1000  BP: (!) 99/49 (!) 88/67 (!) 88/67 (!) 124/102  Pulse: 73 (!) 52 (!) 49 89  Resp: 15 (!) 24 13 16   Temp:   98 F (36.7 C)   TempSrc:   Oral   SpO2: 91% 91% 93% 96%  Weight:      Height:        Intake/Output Summary (Last 24 hours) at 12/06/2021 1207 Last data filed at 12/06/2021 0800 Gross per 24 hour  Intake 405.38 ml  Output --  Net 405.38 ml   Filed Weights   12/04/21 1942 12/05/21 0100  Weight: 81.6 kg 82.4 kg    Physical Exam   General appearance: alert and no distress Neck: no carotid bruit, no JVD, and thyroid not enlarged, symmetric, no tenderness/mass/nodules Lungs: clear to auscultation bilaterally Heart: irregularly irregular rhythm Abdomen: soft, non-tender; bowel sounds normal; no masses,  no organomegaly Extremities: extremities normal, atraumatic, no cyanosis or edema Pulses: 2+ and symmetric Skin: Skin color, texture, turgor normal. No rashes or lesions Neurologic: Grossly normal Psych: Pleasant  Inpatient Medications    Scheduled Meds:  apixaban  5 mg Oral BID   Chlorhexidine Gluconate Cloth  6 each Topical Daily   metoprolol tartrate  50 mg  Oral BID   pantoprazole  40 mg Oral Daily   PARoxetine  40 mg Oral Daily    Continuous Infusions:  sodium chloride Stopped (12/05/21 0545)    PRN Meds: sodium chloride, acetaminophen **OR** acetaminophen, ALPRAZolam, melatonin   Labs   Results for orders placed or performed during the hospital encounter of 12/04/21 (from the past 48 hour(s))  Urinalysis, Routine w reflex microscopic Urine, Clean Catch     Status: Abnormal   Collection Time: 12/04/21  7:46 PM  Result Value Ref Range   Color, Urine YELLOW YELLOW   APPearance CLEAR CLEAR   Specific Gravity, Urine 1.011 1.005 - 1.030   pH 6.0 5.0 - 8.0   Glucose, UA NEGATIVE NEGATIVE mg/dL   Hgb urine dipstick SMALL (A) NEGATIVE   Bilirubin Urine NEGATIVE NEGATIVE   Ketones, ur 5 (A) NEGATIVE mg/dL   Protein, ur NEGATIVE NEGATIVE mg/dL   Nitrite NEGATIVE NEGATIVE   Leukocytes,Ua NEGATIVE NEGATIVE   RBC / HPF 0-5 0 - 5 RBC/hpf   WBC, UA 0-5 0 - 5 WBC/hpf   Bacteria, UA RARE (A) NONE SEEN   Squamous Epithelial / LPF 0-5 0 - 5    Comment: Performed at [facility], [place]. [address], [place], [place] [address]  Comprehensive metabolic panel     Status: Abnormal   Collection Time: 12/04/21  7:54 PM  Result Value Ref Range   Sodium 138 135 -  145 mmol/L   Potassium 3.1 (L) 3.5 - 5.1 mmol/L   Chloride 100 98 - 111 mmol/L   CO2 27 22 - 32 mmol/L   Glucose, Bld 110 (H) 70 - 99 mg/dL    Comment: Glucose reference range applies only to samples taken after fasting for at least 8 hours.   BUN 19 8 - 23 mg/dL   Creatinine, Ser 1.05 (H) 0.44 - 1.00 mg/dL   Calcium 9.5 8.9 - 10.3 mg/dL   Total Protein 7.0 6.5 - 8.1 g/dL   Albumin 4.2 3.5 - 5.0 g/dL   AST 24 15 - 41 U/L   ALT 31 0 - 44 U/L   Alkaline Phosphatase 63 38 - 126 U/L   Total Bilirubin 0.9 0.3 - 1.2 mg/dL   GFR, Estimated 55 (L) >60 mL/min    Comment: (NOTE) Calculated using the CKD-EPI Creatinine Equation (2021)    Anion gap 11 5 - 15    Comment:  Performed at [facility], [place] [address] [place], [place] [address]  Lipase, blood     Status: None   Collection Time: 12/04/21  7:54 PM  Result Value Ref Range   Lipase 33 11 - 51 U/L    Comment: Performed at [facility], [place] [address] [place], [place] [address]  CBC with Differential     Status: Abnormal   Collection Time: 12/04/21  7:54 PM  Result Value Ref Range   WBC 15.6 (H) 4.0 - 10.5 K/uL   RBC 4.94 3.87 - 5.11 MIL/uL   Hemoglobin 14.1 12.0 - 15.0 g/dL   HCT 41.5 36.0 - 46.0 %   MCV 84.0 80.0 - 100.0 fL   MCH 28.5 26.0 - 34.0 pg   MCHC 34.0 30.0 - 36.0 g/dL   RDW 13.0 11.5 - 15.5 %   Platelets 232 150 - 400 K/uL   nRBC 0.0 0.0 - 0.2 %   Neutrophils Relative % 78 %   Neutro Abs 12.3 (H) 1.7 - 7.7 K/uL   Lymphocytes Relative 12 %   Lymphs Abs 1.9 0.7 - 4.0 K/uL   Monocytes Relative 7 %   Monocytes Absolute 1.1 (H) 0.1 - 1.0 K/uL   Eosinophils Relative 1 %   Eosinophils Absolute 0.1 0.0 - 0.5 K/uL   Basophils Relative 1 %   Basophils Absolute 0.1 0.0 - 0.1 K/uL   Immature Granulocytes 1 %   Abs Immature Granulocytes 0.08 (H) 0.00 - 0.07 K/uL    Comment: Performed at [facility], [place] [address] [place], [place] [address]  Troponin I (High Sensitivity)     Status: None   Collection Time: 12/04/21  7:54 PM  Result Value Ref Range   Troponin I (High Sensitivity) 5 <18 ng/L    Comment: (NOTE) Elevated high sensitivity troponin I (hsTnI) values and significant  changes across serial measurements may suggest ACS but many other  chronic and acute conditions are known to elevate hsTnI results.  Refer to the "Links" section for chest pain algorithms and additional  guidance. Performed at [facility], [place] [address], [place], [place] [address]   CBG monitoring, ED     Status: Abnormal   Collection Time: 12/04/21  8:40 PM  Result Value Ref Range   Glucose-Capillary 109 (H) 70 - 99 mg/dL     Comment: Glucose reference range applies only to samples taken after fasting for at least 8 hours.  TSH     Status: None  Collection Time: 12/04/21  9:09 PM  Result Value Ref Range   TSH 3.499 0.350 - 4.500 uIU/mL    Comment: Performed by a 3rd Generation assay with a functional sensitivity of <=0.01 uIU/mL. Performed at [facility], [place] [address], [place], [place] [address]   Magnesium     Status: None   Collection Time: 12/04/21  9:09 PM  Result Value Ref Range   Magnesium 1.8 1.7 - 2.4 mg/dL    Comment: Performed at [facility], [place] [address] [place], [place] [address]  Troponin I (High Sensitivity)     Status: None   Collection Time: 12/04/21  9:31 PM  Result Value Ref Range   Troponin I (High Sensitivity) 6 <18 ng/L    Comment: (NOTE) Elevated high sensitivity troponin I (hsTnI) values and significant  changes across serial measurements may suggest ACS but many other  chronic and acute conditions are known to elevate hsTnI results.  Refer to the "Links" section for chest pain algorithms and additional  guidance. Performed at [facility], [place] [address], [place], [place] [address]   Resp Panel by RT-PCR (Flu A&B, Covid) Nasopharyngeal Swab     Status: None   Collection Time: 12/04/21  9:46 PM   Specimen: Nasopharyngeal Swab; Nasopharyngeal(NP) swabs in vial transport medium  Result Value Ref Range   SARS Coronavirus 2 by RT PCR NEGATIVE NEGATIVE    Comment: (NOTE) SARS-CoV-2 target nucleic acids are NOT DETECTED.  The SARS-CoV-2 RNA is generally detectable in upper respiratory specimens during the acute phase of infection. The lowest concentration of SARS-CoV-2 viral copies this assay can detect is 138 copies/mL. A negative result does not preclude SARS-Cov-2 infection and should not be used as the sole basis for treatment or other patient management decisions. A negative result may occur with  improper  specimen collection/handling, submission of specimen other than nasopharyngeal swab, presence of viral mutation(s) within the areas targeted by this assay, and inadequate number of viral copies(<138 copies/mL). A negative result must be combined with clinical observations, patient history, and epidemiological information. The expected result is Negative.  Fact Sheet for Patients:  EntrepreneurPulse.com.au  Fact Sheet for Healthcare Providers:  IncredibleEmployment.be  This test is no t yet approved or cleared by the [place] FDA and  has been authorized for detection and/or diagnosis of SARS-CoV-2 by FDA under an Emergency Use Authorization (EUA). This EUA will remain  in effect (meaning this test can be used) for the duration of the COVID-19 declaration under Section 564(b)(1) of the Act, 21 U.S.C.section 360bbb-3(b)(1), unless the authorization is terminated  or revoked sooner.       Influenza A by PCR NEGATIVE NEGATIVE   Influenza B by PCR NEGATIVE NEGATIVE    Comment: (NOTE) The Xpert Xpress SARS-CoV-2/FLU/RSV plus assay is intended as an aid in the diagnosis of influenza from Nasopharyngeal swab specimens and should not be used as a sole basis for treatment. Nasal washings and aspirates are unacceptable for Xpert Xpress SARS-CoV-2/FLU/RSV testing.  Fact Sheet for Patients: EntrepreneurPulse.com.au  Fact Sheet for Healthcare Providers: IncredibleEmployment.be  This test is not yet approved or cleared by the [place] FDA and has been authorized for detection and/or diagnosis of SARS-CoV-2 by FDA under an Emergency Use Authorization (EUA). This EUA will remain in effect (meaning this test can be used) for the duration of the COVID-19 declaration under Section 564(b)(1) of the Act, 21 U.S.C. section 360bbb-3(b)(1), unless the authorization is terminated or revoked.  Performed at [facility], [place] [address], [place], [place] [address]   MRSA Next Gen by PCR, Nasal     Status: None   Collection Time: 12/05/21 12:26 AM   Specimen: Nasal Mucosa; Nasal Swab  Result Value Ref Range   MRSA by PCR Next Gen NOT DETECTED NOT DETECTED    Comment: (NOTE) The GeneXpert MRSA Assay (FDA approved for NASAL specimens only), is one component of a comprehensive MRSA colonization surveillance program. It is not intended to diagnose MRSA infection nor to guide or monitor treatment for MRSA infections. Test performance is not FDA approved in patients less than 53 years old. Performed at [facility], [place] [address] [place], [place] [address]   CBC     Status: None   Collection Time: 12/05/21  6:46 AM  Result Value Ref Range   WBC 7.5 4.0 - 10.5 K/uL   RBC 4.58 3.87 - 5.11 MIL/uL   Hemoglobin 13.3 12.0 - 15.0 g/dL   HCT 38.6 36.0 - 46.0 %   MCV 84.3 80.0 - 100.0 fL   MCH 29.0 26.0 - 34.0 pg   MCHC 34.5 30.0 - 36.0 g/dL   RDW 13.2 11.5 - 15.5 %   Platelets 205 150 - 400 K/uL   nRBC 0.0 0.0 - 0.2 %    Comment: Performed at [facility], [place] [address], [place], [place] [address]  Basic metabolic panel     Status: Abnormal   Collection Time: 12/05/21  6:46 AM  Result Value Ref Range   Sodium 138 135 - 145 mmol/L   Potassium 3.1 (L) 3.5 - 5.1 mmol/L   Chloride 103 98 - 111 mmol/L   CO2 26 22 - 32 mmol/L   Glucose, Bld 153 (H) 70 - 99 mg/dL    Comment: Glucose reference range applies only to samples taken after fasting for at least 8 hours.   BUN 12 8 - 23 mg/dL   Creatinine, Ser 0.74 0.44 - 1.00 mg/dL   Calcium 8.8 (L) 8.9 - 10.3 mg/dL   GFR, Estimated >60 >60 mL/min    Comment: (NOTE) Calculated using the CKD-EPI Creatinine Equation (2021)    Anion gap 9 5 - 15    Comment: Performed at [facility], [place] [address] [place], [place] [address]  Magnesium     Status: None   Collection Time: 12/05/21  6:46  AM  Result Value Ref Range   Magnesium 2.3 1.7 - 2.4 mg/dL    Comment: Performed at [facility], [place] [address], [place], [place] [address]  Basic metabolic panel     Status: Abnormal   Collection Time: 12/06/21  3:09 AM  Result Value Ref Range   Sodium 139 135 - 145 mmol/L   Potassium 4.6 3.5 - 5.1 mmol/L    Comment: DELTA CHECK NOTED   Chloride 105 98 - 111 mmol/L   CO2 26 22 - 32 mmol/L   Glucose, Bld 106 (H) 70 - 99 mg/dL    Comment: Glucose reference range applies only to samples taken after fasting for at least 8 hours.   BUN 19 8 - 23 mg/dL   Creatinine, Ser 0.90 0.44 - 1.00 mg/dL   Calcium 9.0 8.9 - 10.3 mg/dL   GFR, Estimated >60 >60 mL/min    Comment: (NOTE) Calculated using the CKD-EPI Creatinine Equation (2021)    Anion gap 8 5 - 15    Comment: Performed at [facility], [place] [address] [place], [place] [address]  Magnesium     Status: None   Collection Time: 12/06/21  3:09 AM  Result Value Ref Range   Magnesium 2.3 1.7 - 2.4 mg/dL    Comment: Performed at [facility], [place] [address], [place], [place] [address]    ECG   N/A  Telemetry   A. fib with controlled ventricular response- Personally Reviewed  Radiology    DG Chest Portable 1 View  Result Date: 12/05/2021 CLINICAL DATA:  Atrial fibrillation EXAM: PORTABLE CHEST 1 VIEW COMPARISON:  None. FINDINGS: The heart size and mediastinal contours are within normal limits. Both lungs are clear. The visualized skeletal structures are unremarkable. IMPRESSION: No active disease. Electronically Signed   By: Ulyses Jarred M.D.   On: 12/05/2021 00:04   ECHOCARDIOGRAM COMPLETE  Result Date: 12/05/2021    ECHOCARDIOGRAM REPORT   Patient Name:   Kristina Dougherty Date of Exam: 12/05/2021 Medical Rec #:  [id number]         Height:       63.0 in Accession #:    [id number]        Weight:       181.7 lb Date of Birth:  1946-07-03         BSA:          1.856 m Patient Age:     70 years          BP:           108/70 mmHg Patient Gender: F                 HR:           91 bpm. Exam Location:  Inpatient Procedure: 2D Echo Indications:    Atrial fibrillation  History:        Patient has no prior history of Echocardiogram examinations.                 Risk Factors:Hypertension.  Sonographer:    Arlyss Gandy Referring Phys: [id number] [place]  1. Left ventricular ejection fraction, by estimation, is 55 to 60%. The left ventricle has normal function. The left ventricle has no regional wall motion abnormalities. Left ventricular diastolic parameters are indeterminate.  2. Right ventricular systolic function is normal. The right ventricular size is normal. There is normal pulmonary artery systolic pressure. The estimated right ventricular systolic pressure is 53.9 mmHg.  3. The mitral valve is normal in structure. Mild mitral valve regurgitation. No evidence of mitral stenosis.  4. The aortic valve is tricuspid. Aortic valve regurgitation is not visualized. No aortic stenosis is present.  5. The inferior vena cava is normal in size with greater than 50% respiratory variability, suggesting right atrial pressure of 3 mmHg.  6. Cannot exclude a small PFO. FINDINGS  Left Ventricle: Left ventricular ejection fraction, by estimation, is 55 to 60%. The left ventricle has normal function. The left ventricle has no regional wall motion abnormalities. The left ventricular internal cavity size was normal in size. There is  no left ventricular hypertrophy. Left ventricular diastolic parameters are indeterminate. Right Ventricle: The right ventricular size is normal. No increase in right ventricular wall thickness. Right ventricular systolic function is normal. There is normal pulmonary artery systolic pressure. The tricuspid regurgitant velocity is 2.40 m/s, and  with an assumed right atrial pressure of 3 mmHg, the estimated right ventricular systolic pressure is 76.7 mmHg. Left Atrium:  Left atrial size  was normal in size. Right Atrium: Right atrial size was normal in size. Pericardium: Trivial pericardial effusion is present. Mitral Valve: The mitral valve is normal in structure. Mild mitral valve regurgitation. No evidence of mitral valve stenosis. Tricuspid Valve: The tricuspid valve is normal in structure. Tricuspid valve regurgitation is trivial. Aortic Valve: The aortic valve is tricuspid. Aortic valve regurgitation is not visualized. No aortic stenosis is present. Aortic valve mean gradient measures 4.0 mmHg. Aortic valve peak gradient measures 8.2 mmHg. Aortic valve area, by VTI measures 2.07 cm. Pulmonic Valve: The pulmonic valve was not well visualized. Pulmonic valve regurgitation is not visualized. Aorta: The aortic root and ascending aorta are structurally normal, with no evidence of dilitation. Venous: The inferior vena cava is normal in size with greater than 50% respiratory variability, suggesting right atrial pressure of 3 mmHg. IAS/Shunts: Cannot exclude a small PFO.  LEFT VENTRICLE PLAX 2D LVIDd:         3.90 cm   Diastology LVIDs:         2.80 cm   LV e' medial:    10.30 cm/s LV PW:         0.80 cm   LV E/e' medial:  11.7 LV IVS:        0.80 cm   LV e' lateral:   9.90 cm/s LVOT diam:     1.80 cm   LV E/e' lateral: 12.1 LV SV:         62 LV SV Index:   33 LVOT Area:     2.54 cm  RIGHT VENTRICLE             IVC RV Basal diam:  3.20 cm     IVC diam: 1.90 cm RV S prime:     12.60 cm/s TAPSE (M-mode): 1.5 cm LEFT ATRIUM             Index        RIGHT ATRIUM           Index LA diam:        4.00 cm 2.15 cm/m   RA Area:     14.00 cm LA Vol (A2C):   50.7 ml 27.31 ml/m  RA Volume:   28.90 ml  15.57 ml/m LA Vol (A4C):   43.1 ml 23.22 ml/m LA Biplane Vol: 47.4 ml 25.54 ml/m  AORTIC VALVE AV Area (Vmax):    2.19 cm AV Area (Vmean):   2.20 cm AV Area (VTI):     2.07 cm AV Vmax:           143.00 cm/s AV Vmean:          94.700 cm/s AV VTI:            0.297 m AV Peak Grad:      8.2  mmHg AV Mean Grad:      4.0 mmHg LVOT Vmax:         123.00 cm/s LVOT Vmean:        82.000 cm/s LVOT VTI:          0.242 m LVOT/AV VTI ratio: 0.81  AORTA Ao Root diam: 2.90 cm Ao Asc diam:  3.10 cm MITRAL VALVE                TRICUSPID VALVE MV Area (PHT): 3.99 cm     TR Peak grad:   23.0 mmHg MV Decel Time: 190 msec     TR Vmax:  240.00 cm/s MV E velocity: 120.00 cm/s                             SHUNTS                             Systemic VTI:  0.24 m                             Systemic Diam: 1.80 cm Oswaldo Milian MD Electronically signed by Oswaldo Milian MD Signature Date/Time: 12/05/2021/12:37:25 PM    Final     Cardiac Studies   See echo above  Assessment   Principal Problem:   Atrial fibrillation with rapid ventricular response ([place]) Active Problems:   ANXIETY DEPRESSION   Essential hypertension   Leukocytosis   Hypokalemia   Plan   A. fib appears to be rate controlled.  Blood pressure has improved somewhat and not as low as it was overnight, however, she was on 3 blood pressure medications at home which are being held.  Her hypotension last evening was likely due to combination of the offloading effects of diltiazem and starting metoprolol. Not clear why she is not requiring her other BP meds - LV function was normal on echo.  From a cardiac standpoint, she would be okay to be discharged today.  We will try to schedule follow-up in at least 3 weeks for repeat EKG in the office.  If she is persistently in atrial fibrillation then she will need to be scheduled for an elective cardioversion.  She is advised to continue on her Eliquis and not miss any doses which would then not allow [place] to proceed with a cardioversion.  Time Spent Directly with Patient:  I have spent a total of 25 minutes with the patient reviewing hospital notes, telemetry, EKGs, labs and examining the patient as well as establishing an assessment and plan that was discussed personally with the patient.   > 50% of time was spent in direct patient care.  Length of Stay:  LOS: 0 days   Pixie Casino, MD, [facility], [place] Director of the Advanced Lipid Disorders &  Cardiovascular Risk Reduction Clinic Diplomate of the American Board of Clinical Lipidology Attending Cardiologist  Direct Dial: [phone]   Fax: [phone]  Website:  [email or web address] 12/06/2021, 12:07 PM

## 2021-12-06 NOTE — Discharge Summary (Addendum)
Physician Discharge Summary  Kristina Dougherty EXB:[id number] DOB: January 14, 1946 DOA: 12/04/2021  PCP: Susy Frizzle, MD  Admit date: 12/04/2021 Discharge date: 12/06/2021  Admitted From: Home Disposition: Home  Recommendations for Outpatient Follow-up:  Follow up with PCP in 1-2 weeks Follow-up with cardiology in 3 weeks for repeat EKG, may need cardioversion for A. fib with RVR Started on metoprolol tartrate 50 mg p.o. twice daily Discontinued home amlodipine, HCTZ, losartan Continue to monitor BP closely, restart antihypertensives if needed  Home Health: No Equipment/Devices: None  Discharge Condition: Stable CODE STATUS: Full code Diet recommendation: Heart healthy diet  History of present illness:  Kristina Dougherty is a 76 year old female with past medical history significant for essential hypertension, anxiety/depression, tobacco use disorder who presents to [facility] ED on 1/8 with complaints of anxiety, palpitations, and "nervous feeling in her stomach".  Patient reports history of anxiety for several years in which she takes Paxil.  Wilburn Mylar was doing some chores at home/packing up Christmas items when all of a sudden she felt extremely anxious.  She thought her symptoms were related to her blood pressure being so high, so she took her home antihypertensives but still continued to feel anxious which prompted her to come to the ED for further evaluation.  No previous episodes and similarity prior.  Denies shortness of breath, no chest pain, no nausea/vomiting/diarrhea, no abdominal pain, no dysuria, no urinary frequency/urgency.   In the ED, 98.8 F, HR 150, RR 20, BP 99/56.  Sodium 138, potassium 3.1, chloride 100, CO2 27, glucose 110, BUN 19, creatinine 1.05, AST 24, ALT 31, total bilirubin 0.9.  Lipase 33.  WBC 15.6, hemoglobin 14.1, platelets 232.  COVID-19 PCR negative.  Influenza A/B PCR negative.  High sensitive troponin 5>6, within normal limits.  Urinalysis unrevealing.  Chest  x-ray with no active cardiopulmonary disease process.  EKG with atrial fibrillation, rate 145, QTc 426, no concerning dynamic changes.  He was consulted.  Hospital service consulted for further evaluation management of new onset atrial fibrillation with RVR.  Hospital course:  Atrial fibrillation with RVR, new diagnosis Patient presenting to the ED with acute onset palpitations.  Was found to be in A. fib with RVR, new diagnosis.  TSH 3.499, within normal limits.  CHA2DS2-VASc = 4.  Cardiology was consulted and following hospital course.  TTE with LVEF 55-60%, normal LV function with no regional wall motion normalities, diastolic parameters indeterminate, mild MR, no aortic stenosis, IVC normal in size.  Patient was initially started on a Cardizem drip and transition to metoprolol tartrate 50 mg p.o. twice daily.  Patient was started on Eliquis 5 mg p.o. twice daily for anticoagulation.  Patient to follow-up with cardiology in 3 weeks for repeat EKG, if remains in A. fib we will consider cardioversion at that time.   Hypokalemia Hypomagnesemia Repleted during hospitalization.   Leukocytosis: Resolved WBC count elevated 15.6 on admission, likely reactive in the setting of A. fib with RVR.  No infectious etiology elucidated with negative chest x-ray and normal urinalysis.  Repeat WBC count today 7.5, now resolved.  No antibiotics were received.   Essential hypertension On hydrochlorothiazide, 12.5 mg p.o. daily, losartan 50 mg p.o. daily, amlodipine 10 mg p.o. daily at home.  Home and hypertensives were discontinued in favor of metoprolol tartrate 50 g p.o. twice daily.  Continue monitor blood pressures outpatient and restart antihypertensives as needed.   Anxiety/depression: Continue home Paxil 20 mg p.o. daily and Xanax 0.5 mg p.o. nightly as needed for  sleep   GERD: Protonix 40 mg p.o. daily   Tobacco use disorder: Counseled on need for tobacco cessation.  Discharge Diagnoses:  Principal  Problem:   Atrial fibrillation with rapid ventricular response (HCC) Active Problems:   ANXIETY DEPRESSION   Essential hypertension    Discharge Instructions  Discharge Instructions     Amb referral to AFIB Clinic   Complete by: As directed    Amb referral to AFIB Clinic   Complete by: As directed    Call MD for:  difficulty breathing, headache or visual disturbances   Complete by: As directed    Call MD for:  persistant dizziness or light-headedness   Complete by: As directed    Call MD for:  persistant nausea and vomiting   Complete by: As directed    Call MD for:  severe uncontrolled pain   Complete by: As directed    Call MD for:  temperature >100.4   Complete by: As directed    Diet - low sodium heart healthy   Complete by: As directed    Increase activity slowly   Complete by: As directed    No wound care   Complete by: As directed       Allergies as of 12/06/2021       Reactions   Morphine And Related    Severe HA per pt   Nitrofurantoin Nausea And Vomiting        Medication List     STOP taking these medications    amLODipine 10 MG tablet Commonly known as: NORVASC   aspirin 81 MG tablet   hydrochlorothiazide 12.5 MG capsule Commonly known as: MICROZIDE   losartan 50 MG tablet Commonly known as: COZAAR   meloxicam 15 MG tablet Commonly known as: MOBIC       TAKE these medications    alendronate 70 MG tablet Commonly known as: FOSAMAX TAKE 1 TABLET EVERY 7 DAYS. TAKE WITH A FULL GLASS OF WATER ON AN EMPTY STOMACH.   ALPRAZolam 0.5 MG tablet Commonly known as: XANAX TAKE 1 TABLET BY MOUTH THREE TIMES DAILY AS NEEDED FOR ANXIETY OR SLEEP What changed: See the new instructions.   apixaban 5 MG Tabs tablet Commonly known as: ELIQUIS Take 1 tablet (5 mg total) by mouth 2 (two) times daily.   clobetasol cream 0.05 % Commonly known as: TEMOVATE APPLY ONE APPLICATION TOPICALLY TWO TIMES DAILY What changed:  how much to take how to  take this when to take this additional instructions   melatonin 1 MG Tabs tablet Take 1 mg by mouth at bedtime as needed (sleep).   metoprolol tartrate 50 MG tablet Commonly known as: LOPRESSOR Take 1 tablet (50 mg total) by mouth 2 (two) times daily.   multivitamin with minerals Tabs tablet Take 1 tablet by mouth daily.   pantoprazole 40 MG tablet Commonly known as: PROTONIX TAKE 1 TABLET BY MOUTH  DAILY What changed: how to take this   PARoxetine 20 MG tablet Commonly known as: PAXIL TAKE 1 TABLET BY MOUTH  TWICE DAILY What changed:  how much to take when to take this        Follow-up Information     Susy Frizzle, MD. Schedule an appointment as soon as possible for a visit in 1 week(s).   Specialty: Family Medicine Contact information: [address] [place] [address] [phone]         [place]. Schedule an appointment as soon  as possible for a visit in 3 week(s).   Specialty: Cardiology Contact information: [address] [id number] [place] [address] [phone]               Allergies  Allergen Reactions   Morphine And Related     Severe HA per pt   Nitrofurantoin Nausea And Vomiting    Consultations: Cardiology   Procedures/Studies: DG Chest Portable 1 View  Result Date: 12/05/2021 CLINICAL DATA:  Atrial fibrillation EXAM: PORTABLE CHEST 1 VIEW COMPARISON:  None. FINDINGS: The heart size and mediastinal contours are within normal limits. Both lungs are clear. The visualized skeletal structures are unremarkable. IMPRESSION: No active disease. Electronically Signed   By: Ulyses Jarred M.D.   On: 12/05/2021 00:04   ECHOCARDIOGRAM COMPLETE  Result Date: 12/05/2021    ECHOCARDIOGRAM REPORT   Patient Name:   SYDELL PROWELL Date of Exam: 12/05/2021 Medical Rec #:  [id number]         Height:       63.0 in Accession #:    [id number]        Weight:       181.7 lb Date of  Birth:  1946/03/10         BSA:          1.856 m Patient Age:    35 years          BP:           108/70 mmHg Patient Gender: F                 HR:           91 bpm. Exam Location:  Inpatient Procedure: 2D Echo Indications:    Atrial fibrillation  History:        Patient has no prior history of Echocardiogram examinations.                 Risk Factors:Hypertension.  Sonographer:    Arlyss Gandy Referring Phys: [id number] [place]  1. Left ventricular ejection fraction, by estimation, is 55 to 60%. The left ventricle has normal function. The left ventricle has no regional wall motion abnormalities. Left ventricular diastolic parameters are indeterminate.  2. Right ventricular systolic function is normal. The right ventricular size is normal. There is normal pulmonary artery systolic pressure. The estimated right ventricular systolic pressure is 92.0 mmHg.  3. The mitral valve is normal in structure. Mild mitral valve regurgitation. No evidence of mitral stenosis.  4. The aortic valve is tricuspid. Aortic valve regurgitation is not visualized. No aortic stenosis is present.  5. The inferior vena cava is normal in size with greater than 50% respiratory variability, suggesting right atrial pressure of 3 mmHg.  6. Cannot exclude a small PFO. FINDINGS  Left Ventricle: Left ventricular ejection fraction, by estimation, is 55 to 60%. The left ventricle has normal function. The left ventricle has no regional wall motion abnormalities. The left ventricular internal cavity size was normal in size. There is  no left ventricular hypertrophy. Left ventricular diastolic parameters are indeterminate. Right Ventricle: The right ventricular size is normal. No increase in right ventricular wall thickness. Right ventricular systolic function is normal. There is normal pulmonary artery systolic pressure. The tricuspid regurgitant velocity is 2.40 m/s, and  with an assumed right atrial pressure of 3 mmHg, the estimated  right ventricular systolic pressure is 10.0 mmHg. Left Atrium: Left atrial size was normal in size. Right Atrium: Right atrial size was  normal in size. Pericardium: Trivial pericardial effusion is present. Mitral Valve: The mitral valve is normal in structure. Mild mitral valve regurgitation. No evidence of mitral valve stenosis. Tricuspid Valve: The tricuspid valve is normal in structure. Tricuspid valve regurgitation is trivial. Aortic Valve: The aortic valve is tricuspid. Aortic valve regurgitation is not visualized. No aortic stenosis is present. Aortic valve mean gradient measures 4.0 mmHg. Aortic valve peak gradient measures 8.2 mmHg. Aortic valve area, by VTI measures 2.07 cm. Pulmonic Valve: The pulmonic valve was not well visualized. Pulmonic valve regurgitation is not visualized. Aorta: The aortic root and ascending aorta are structurally normal, with no evidence of dilitation. Venous: The inferior vena cava is normal in size with greater than 50% respiratory variability, suggesting right atrial pressure of 3 mmHg. IAS/Shunts: Cannot exclude a small PFO.  LEFT VENTRICLE PLAX 2D LVIDd:         3.90 cm   Diastology LVIDs:         2.80 cm   LV e' medial:    10.30 cm/s LV PW:         0.80 cm   LV E/e' medial:  11.7 LV IVS:        0.80 cm   LV e' lateral:   9.90 cm/s LVOT diam:     1.80 cm   LV E/e' lateral: 12.1 LV SV:         62 LV SV Index:   33 LVOT Area:     2.54 cm  RIGHT VENTRICLE             IVC RV Basal diam:  3.20 cm     IVC diam: 1.90 cm RV S prime:     12.60 cm/s TAPSE (M-mode): 1.5 cm LEFT ATRIUM             Index        RIGHT ATRIUM           Index LA diam:        4.00 cm 2.15 cm/m   RA Area:     14.00 cm LA Vol (A2C):   50.7 ml 27.31 ml/m  RA Volume:   28.90 ml  15.57 ml/m LA Vol (A4C):   43.1 ml 23.22 ml/m LA Biplane Vol: 47.4 ml 25.54 ml/m  AORTIC VALVE AV Area (Vmax):    2.19 cm AV Area (Vmean):   2.20 cm AV Area (VTI):     2.07 cm AV Vmax:           143.00 cm/s AV Vmean:           94.700 cm/s AV VTI:            0.297 m AV Peak Grad:      8.2 mmHg AV Mean Grad:      4.0 mmHg LVOT Vmax:         123.00 cm/s LVOT Vmean:        82.000 cm/s LVOT VTI:          0.242 m LVOT/AV VTI ratio: 0.81  AORTA Ao Root diam: 2.90 cm Ao Asc diam:  3.10 cm MITRAL VALVE                TRICUSPID VALVE MV Area (PHT): 3.99 cm     TR Peak grad:   23.0 mmHg MV Decel Time: 190 msec     TR Vmax:        240.00 cm/s MV E velocity: 120.00 cm/s  SHUNTS                             Systemic VTI:  0.24 m                             Systemic Diam: 1.80 cm Oswaldo Milian MD Electronically signed by Oswaldo Milian MD Signature Date/Time: 12/05/2021/12:37:25 PM    Final      Subjective: Patient seen examined bedside, resting comfortably.  Eating lunch.  Seen by cardiologist morning with recommendations of discharge home with outpatient follow-up.  Heart rate well controlled.  No other questions or concerns at this time.  Denies headache, no fever/chills/night sweats, no nausea/vomiting/diarrhea, no chest pain, no palpitations, no shortness of breath, no abdominal pain, no weakness, no fatigue, no paresthesias.  No acute events overnight per nursing staff.  Discharge Exam: Vitals:   12/06/21 1213 12/06/21 1232  BP: (!) 118/95   Pulse: (!) 54 (!) 39  Resp: 19 20  Temp:  97.7 F (36.5 C)  SpO2: 95% 94%   Vitals:   12/06/21 1000 12/06/21 1100 12/06/21 1213 12/06/21 1232  BP: (!) 124/102 111/67 (!) 118/95   Pulse: 89 (!) 56 (!) 54 (!) 39  Resp: 16 14 19 20   Temp:    97.7 F (36.5 C)  TempSrc:    Oral  SpO2: 96% 94% 95% 94%  Weight:      Height:        General: Pt is alert, awake, not in acute distress Cardiovascular: Irregularly irregular rhythm, normal rate, S1/S2 +, no rubs, no gallops Respiratory: CTA bilaterally, no wheezing, no rhonchi, on room air Abdominal: Soft, NT, ND, bowel sounds + Extremities: no edema, no cyanosis    The results of significant  diagnostics from this hospitalization (including imaging, microbiology, ancillary and laboratory) are listed below for reference.     Microbiology: Recent Results (from the past 240 hour(s))  Resp Panel by RT-PCR (Flu A&B, Covid) Nasopharyngeal Swab     Status: None   Collection Time: 12/04/21  9:46 PM   Specimen: Nasopharyngeal Swab; Nasopharyngeal(NP) swabs in vial transport medium  Result Value Ref Range Status   SARS Coronavirus 2 by RT PCR NEGATIVE NEGATIVE Final    Comment: (NOTE) SARS-CoV-2 target nucleic acids are NOT DETECTED.  The SARS-CoV-2 RNA is generally detectable in upper respiratory specimens during the acute phase of infection. The lowest concentration of SARS-CoV-2 viral copies this assay can detect is 138 copies/mL. A negative result does not preclude SARS-Cov-2 infection and should not be used as the sole basis for treatment or other patient management decisions. A negative result may occur with  improper specimen collection/handling, submission of specimen other than nasopharyngeal swab, presence of viral mutation(s) within the areas targeted by this assay, and inadequate number of viral copies(<138 copies/mL). A negative result must be combined with clinical observations, patient history, and epidemiological information. The expected result is Negative.  Fact Sheet for Patients:  EntrepreneurPulse.com.au  Fact Sheet for Healthcare Providers:  IncredibleEmployment.be  This test is no t yet approved or cleared by the [place] FDA and  has been authorized for detection and/or diagnosis of SARS-CoV-2 by FDA under an Emergency Use Authorization (EUA). This EUA will remain  in effect (meaning this test can be used) for the duration of the COVID-19 declaration under Section 564(b)(1) of the Act, 21 U.S.C.section 360bbb-3(b)(1), unless the authorization is  terminated  or revoked sooner.       Influenza A by PCR NEGATIVE  NEGATIVE Final   Influenza B by PCR NEGATIVE NEGATIVE Final    Comment: (NOTE) The Xpert Xpress SARS-CoV-2/FLU/RSV plus assay is intended as an aid in the diagnosis of influenza from Nasopharyngeal swab specimens and should not be used as a sole basis for treatment. Nasal washings and aspirates are unacceptable for Xpert Xpress SARS-CoV-2/FLU/RSV testing.  Fact Sheet for Patients: EntrepreneurPulse.com.au  Fact Sheet for Healthcare Providers: IncredibleEmployment.be  This test is not yet approved or cleared by the [place] FDA and has been authorized for detection and/or diagnosis of SARS-CoV-2 by FDA under an Emergency Use Authorization (EUA). This EUA will remain in effect (meaning this test can be used) for the duration of the COVID-19 declaration under Section 564(b)(1) of the Act, 21 U.S.C. section 360bbb-3(b)(1), unless the authorization is terminated or revoked.  Performed at [facility], [place] [address], [place], [place] [address]   MRSA Next Gen by PCR, Nasal     Status: None   Collection Time: 12/05/21 12:26 AM   Specimen: Nasal Mucosa; Nasal Swab  Result Value Ref Range Status   MRSA by PCR Next Gen NOT DETECTED NOT DETECTED Final    Comment: (NOTE) The GeneXpert MRSA Assay (FDA approved for NASAL specimens only), is one component of a comprehensive MRSA colonization surveillance program. It is not intended to diagnose MRSA infection nor to guide or monitor treatment for MRSA infections. Test performance is not FDA approved in patients less than 93 years old. Performed at [facility], [place] [address] [place], [place] [address]      Labs: BNP (last 3 results) No results for input(s): BNP in the last 8760 hours. Basic Metabolic Panel: Recent Labs  Lab 12/04/21 1954 12/04/21 2109 12/05/21 0646 12/06/21 0309  NA 138  --  138 139  K 3.1*  --  3.1* 4.6  CL 100  --  103 105  CO2  27  --  26 26  GLUCOSE 110*  --  153* 106*  BUN 19  --  12 19  CREATININE 1.05*  --  0.74 0.90  CALCIUM 9.5  --  8.8* 9.0  MG  --  1.8 2.3 2.3   Liver Function Tests: Recent Labs  Lab 12/04/21 1954  AST 24  ALT 31  ALKPHOS 63  BILITOT 0.9  PROT 7.0  ALBUMIN 4.2   Recent Labs  Lab 12/04/21 1954  LIPASE 33   No results for input(s): AMMONIA in the last 168 hours. CBC: Recent Labs  Lab 12/04/21 1954 12/05/21 0646  WBC 15.6* 7.5  NEUTROABS 12.3*  --   HGB 14.1 13.3  HCT 41.5 38.6  MCV 84.0 84.3  PLT 232 205   Cardiac Enzymes: No results for input(s): CKTOTAL, CKMB, CKMBINDEX, TROPONINI in the last 168 hours. BNP: Invalid input(s): POCBNP CBG: Recent Labs  Lab 12/04/21 2040  GLUCAP 109*   D-Dimer No results for input(s): DDIMER in the last 72 hours. Hgb A1c No results for input(s): HGBA1C in the last 72 hours. Lipid Profile No results for input(s): CHOL, HDL, LDLCALC, TRIG, CHOLHDL, LDLDIRECT in the last 72 hours. Thyroid function studies Recent Labs    12/04/21 2109  TSH 3.499   Anemia work up No results for input(s): VITAMINB12, FOLATE, FERRITIN, TIBC, IRON, RETICCTPCT in the last 72 hours. Urinalysis    Component Value Date/Time   COLORURINE YELLOW 12/04/2021 1946  APPEARANCEUR CLEAR 12/04/2021 1946   LABSPEC 1.011 12/04/2021 1946   PHURINE 6.0 12/04/2021 1946   GLUCOSEU NEGATIVE 12/04/2021 1946   HGBUR SMALL (A) 12/04/2021 1946   HGBUR negative 11/25/2009 1508   BILIRUBINUR NEGATIVE 12/04/2021 1946   BILIRUBINUR n 01/26/2011 0000   KETONESUR 5 (A) 12/04/2021 1946   PROTEINUR NEGATIVE 12/04/2021 1946   UROBILINOGEN 0.2 04/15/2015 1412   NITRITE NEGATIVE 12/04/2021 1946   LEUKOCYTESUR NEGATIVE 12/04/2021 1946   Sepsis Labs Invalid input(s): PROCALCITONIN,  WBC,  LACTICIDVEN Microbiology Recent Results (from the past 240 hour(s))  Resp Panel by RT-PCR (Flu A&B, Covid) Nasopharyngeal Swab     Status: None   Collection Time: 12/04/21  9:46  PM   Specimen: Nasopharyngeal Swab; Nasopharyngeal(NP) swabs in vial transport medium  Result Value Ref Range Status   SARS Coronavirus 2 by RT PCR NEGATIVE NEGATIVE Final    Comment: (NOTE) SARS-CoV-2 target nucleic acids are NOT DETECTED.  The SARS-CoV-2 RNA is generally detectable in upper respiratory specimens during the acute phase of infection. The lowest concentration of SARS-CoV-2 viral copies this assay can detect is 138 copies/mL. A negative result does not preclude SARS-Cov-2 infection and should not be used as the sole basis for treatment or other patient management decisions. A negative result may occur with  improper specimen collection/handling, submission of specimen other than nasopharyngeal swab, presence of viral mutation(s) within the areas targeted by this assay, and inadequate number of viral copies(<138 copies/mL). A negative result must be combined with clinical observations, patient history, and epidemiological information. The expected result is Negative.  Fact Sheet for Patients:  EntrepreneurPulse.com.au  Fact Sheet for Healthcare Providers:  IncredibleEmployment.be  This test is no t yet approved or cleared by the [place] FDA and  has been authorized for detection and/or diagnosis of SARS-CoV-2 by FDA under an Emergency Use Authorization (EUA). This EUA will remain  in effect (meaning this test can be used) for the duration of the COVID-19 declaration under Section 564(b)(1) of the Act, 21 U.S.C.section 360bbb-3(b)(1), unless the authorization is terminated  or revoked sooner.       Influenza A by PCR NEGATIVE NEGATIVE Final   Influenza B by PCR NEGATIVE NEGATIVE Final    Comment: (NOTE) The Xpert Xpress SARS-CoV-2/FLU/RSV plus assay is intended as an aid in the diagnosis of influenza from Nasopharyngeal swab specimens and should not be used as a sole basis for treatment. Nasal washings and aspirates are  unacceptable for Xpert Xpress SARS-CoV-2/FLU/RSV testing.  Fact Sheet for Patients: EntrepreneurPulse.com.au  Fact Sheet for Healthcare Providers: IncredibleEmployment.be  This test is not yet approved or cleared by the [place] FDA and has been authorized for detection and/or diagnosis of SARS-CoV-2 by FDA under an Emergency Use Authorization (EUA). This EUA will remain in effect (meaning this test can be used) for the duration of the COVID-19 declaration under Section 564(b)(1) of the Act, 21 U.S.C. section 360bbb-3(b)(1), unless the authorization is terminated or revoked.  Performed at [facility], [place] [address], [place], [place] [address]   MRSA Next Gen by PCR, Nasal     Status: None   Collection Time: 12/05/21 12:26 AM   Specimen: Nasal Mucosa; Nasal Swab  Result Value Ref Range Status   MRSA by PCR Next Gen NOT DETECTED NOT DETECTED Final    Comment: (NOTE) The GeneXpert MRSA Assay (FDA approved for NASAL specimens only), is one component of a comprehensive MRSA colonization surveillance program. It is not intended to diagnose  MRSA infection nor to guide or monitor treatment for MRSA infections. Test performance is not FDA approved in patients less than 48 years old. Performed at [facility], [place] [address], [place], [place] [address]      Time coordinating discharge: Over 30 minutes  SIGNED:   Aj Crunkleton J [place] DO  [facility] 12/06/2021, 2:47 PM

## 2021-12-07 ENCOUNTER — Telehealth: Payer: Self-pay

## 2021-12-07 NOTE — Telephone Encounter (Signed)
Called pt to do TOC. Pt was dx with AFIB and states she is having increased anxiety since her dx and is unable to sleep. Pt asks if an rx for Xanax can be sent in to get her through until her appointment with you. I offered the patient an appointment for Monday but she declined. Pt stated that she will call back on Thursday to schedule. Thank you.

## 2021-12-07 NOTE — Telephone Encounter (Signed)
Transition Care Management Follow-up Telephone Call Date of discharge and from where: 12/06/2021 [facility] Diagnosis: Afib How have you been since you were released from the hospital? Pt states she is doing better but is having some issues with Anxiety since she has been in the hospital and can't sleep. Any questions or concerns? No  Items Reviewed: Did the pt receive and understand the discharge instructions provided? Yes  Medications obtained and verified? Yes  Other? No  Any new allergies since your discharge? No  Dietary orders reviewed? Yes Do you have support at home? Yes   Home Care and Equipment/Supplies: Were home health services ordered? no If so, what is the name of the agency? N/A  Has the agency set up a time to come to the patient's home? not applicable Were any new equipment or medical supplies ordered?  No What is the name of the medical supply agency? N/A Were you able to get the supplies/equipment? not applicable Do you have any questions related to the use of the equipment or supplies? No  Functional Questionnaire: (I = Independent and D = Dependent) ADLs: I  Bathing/Dressing- I  Meal Prep- I  Eating- I  Maintaining continence- I  Transferring/Ambulation- I  Managing Meds- I  Follow up appointments reviewed:  PCP Hospital f/u appt confirmed? No  PT DECLINES MAKING APPT AT THIS TIME, SHE STATES SHE WILL CALL BACK TO SCHEDULE. [facility] f/u appt confirmed? No  F/U with [facility] has not been scheduled yet. Are transportation arrangements needed? No  If their condition worsens, is the pt aware to call PCP or go to the Emergency Dept.? Yes Was the patient provided with contact information for the PCP's office or ED? Yes Was to pt encouraged to call back with questions or concerns? Yes

## 2021-12-08 ENCOUNTER — Other Ambulatory Visit: Payer: Self-pay | Admitting: Family Medicine

## 2021-12-08 MED ORDER — ALPRAZOLAM 0.5 MG PO TABS: 0.5000 mg | ORAL_TABLET | Freq: Three times a day (TID) | ORAL | 0 refills | Status: DC | PRN

## 2021-12-15 DIAGNOSIS — K580 Irritable bowel syndrome with diarrhea: Secondary | ICD-10-CM | POA: Insufficient documentation

## 2021-12-15 DIAGNOSIS — K625 Hemorrhage of anus and rectum: Secondary | ICD-10-CM | POA: Insufficient documentation

## 2021-12-15 DIAGNOSIS — R142 Eructation: Secondary | ICD-10-CM | POA: Insufficient documentation

## 2021-12-15 DIAGNOSIS — R197 Diarrhea, unspecified: Secondary | ICD-10-CM | POA: Insufficient documentation

## 2021-12-15 DIAGNOSIS — K640 First degree hemorrhoids: Secondary | ICD-10-CM | POA: Insufficient documentation

## 2021-12-15 DIAGNOSIS — R194 Change in bowel habit: Secondary | ICD-10-CM | POA: Insufficient documentation

## 2021-12-16 ENCOUNTER — Other Ambulatory Visit: Payer: Self-pay

## 2021-12-16 ENCOUNTER — Ambulatory Visit (INDEPENDENT_AMBULATORY_CARE_PROVIDER_SITE_OTHER): Payer: Medicare Other | Admitting: Family Medicine

## 2021-12-16 ENCOUNTER — Encounter: Payer: Self-pay | Admitting: Family Medicine

## 2021-12-16 VITALS — BP 128/82 | HR 83 | Temp 97.2°F | Resp 18 | Ht 63.0 in | Wt 180.0 lb

## 2021-12-16 DIAGNOSIS — I10 Essential (primary) hypertension: Secondary | ICD-10-CM | POA: Diagnosis not present

## 2021-12-16 DIAGNOSIS — I4891 Unspecified atrial fibrillation: Secondary | ICD-10-CM

## 2021-12-16 MED ORDER — ALPRAZOLAM 0.5 MG PO TABS: 0.5000 mg | ORAL_TABLET | Freq: Three times a day (TID) | ORAL | 0 refills | Status: DC | PRN

## 2021-12-16 NOTE — Progress Notes (Signed)
Subjective:    Patient ID: Kristina Dougherty, female    DOB: 1945/12/22, 76 y.o.   MRN: [id number]  HPI Recently admitted to the hospital with atrial fibrillation with RVR.  DC summary included below for my reference: Admit date: 12/04/2021 Discharge date: 12/06/2021     Recommendations for Outpatient Follow-up:  Follow up with PCP in 1-2 weeks Follow-up with cardiology in 3 weeks for repeat EKG, may need cardioversion for A. fib with RVR Started on metoprolol tartrate 50 mg p.o. twice daily Discontinued home amlodipine, HCTZ, losartan Continue to monitor BP closely, restart antihypertensives if needed  History of present illness:   Kristina Dougherty is a 76 year old female with past medical history significant for essential hypertension, anxiety/depression, tobacco use disorder who presents to [facility] ED on 1/8 with complaints of anxiety, palpitations, and "nervous feeling in her stomach".  Patient reports history of anxiety for several years in which she takes Paxil.  Kristina Dougherty was doing some chores at home/packing up Christmas items when all of a sudden she felt extremely anxious.  She thought her symptoms were related to her blood pressure being so high, so she took her home antihypertensives but still continued to feel anxious which prompted her to come to the ED for further evaluation.  No previous episodes and similarity prior.  Denies shortness of breath, no chest pain, no nausea/vomiting/diarrhea, no abdominal pain, no dysuria, no urinary frequency/urgency.   In the ED, 98.8 F, HR 150, RR 20, BP 99/56.  Sodium 138, potassium 3.1, chloride 100, CO2 27, glucose 110, BUN 19, creatinine 1.05, AST 24, ALT 31, total bilirubin 0.9.  Lipase 33.  WBC 15.6, hemoglobin 14.1, platelets 232.  COVID-19 PCR negative.  Influenza A/B PCR negative.  High sensitive troponin 5>6, within normal limits.  Urinalysis unrevealing.  Chest x-ray with no active cardiopulmonary disease process.  EKG with atrial  fibrillation, rate 145, QTc 426, no concerning dynamic changes.  He was consulted.  Hospital service consulted for further evaluation management of new onset atrial fibrillation with RVR.   Hospital course:   Atrial fibrillation with RVR, new diagnosis Patient presenting to the ED with acute onset palpitations.  Was found to be in A. fib with RVR, new diagnosis.  TSH 3.499, within normal limits.  CHA2DS2-VASc = 4.  Cardiology was consulted and following hospital course.  TTE with LVEF 55-60%, normal LV function with no regional wall motion normalities, diastolic parameters indeterminate, mild MR, no aortic stenosis, IVC normal in size.  Patient was initially started on a Cardizem drip and transition to metoprolol tartrate 50 mg p.o. twice daily.  Patient was started on Eliquis 5 mg p.o. twice daily for anticoagulation.  Patient to follow-up with cardiology in 3 weeks for repeat EKG, if remains in A. fib we will consider cardioversion at that time.   Hypokalemia Hypomagnesemia Repleted during hospitalization.   Leukocytosis: Resolved WBC count elevated 15.6 on admission, likely reactive in the setting of A. fib with RVR.  No infectious etiology elucidated with negative chest x-ray and normal urinalysis.  Repeat WBC count today 7.5, now resolved.  No antibiotics were received.   Essential hypertension On hydrochlorothiazide, 12.5 mg p.o. daily, losartan 50 mg p.o. daily, amlodipine 10 mg p.o. daily at home.  Home and hypertensives were discontinued in favor of metoprolol tartrate 50 g p.o. twice daily.  Continue monitor blood pressures outpatient and restart antihypertensives as needed.   Anxiety/depression: Continue home Paxil 20 mg p.o. daily and Xanax 0.5 mg p.o.  nightly as needed for sleep   GERD: Protonix 40 mg p.o. daily   Tobacco use disorder: Counseled on need for tobacco cessation.   12/16/21 Here today for follow up.  In hospital TSH was normal.  Echo showed no valvular disease and  ef 55-60%.  Patient was admitted to the hospital with atrial fibrillation with RVR.  She is still in atrial fibrillation today.  Heart rate is in the mid 90s on exam today and irregular.  She states that she is doing well at home.  She denies any chest pain.  She denies any shortness of breath.  She denies any orthopnea.  She does have trace bipedal edema but she states that this is chronic.  Her lungs are completely clear to auscultation bilaterally.  Since stopping the hydrochlorothiazide, losartan, and amlodipine, her blood pressure is still adequately controlled today 128/82.  She is not checking her blood pressure at home but she states that she does not "feel like her blood pressure is getting high".  She states that she can usually tell when her blood pressure is high because she will feel "flushed in the face and pounding in her neck".  However the blood pressure today is well controlled.  There is no evidence of fluid overload on exam.  She does request a refill on Xanax that she uses occasionally for anxiety and to help with sleep.  She states that she needs the medicine sporadically and would use less than 1 pill a day.   Past Medical History:  Diagnosis Date   Anxiety    Arthritis    Depression    Hypertension    Osteopenia    Past Surgical History:  Procedure Laterality Date   ABDOMINAL HYSTERECTOMY     APPENDECTOMY     Current Outpatient Medications on File Prior to Visit  Medication Sig Dispense Refill   alendronate (FOSAMAX) 70 MG tablet TAKE 1 TABLET EVERY 7 DAYS. TAKE WITH A FULL GLASS OF WATER ON AN EMPTY STOMACH. (Patient not taking: No sig reported) 12 tablet 3   ALPRAZolam (XANAX) 0.5 MG tablet Take 1 tablet (0.5 mg total) by mouth 3 (three) times daily as needed for anxiety. TAKE 1 TABLET BY MOUTH THREE TIMES DAILY AS NEEDED FOR ANXIETY OR SLEEP 30 tablet 0   apixaban (ELIQUIS) 5 MG TABS tablet Take 1 tablet (5 mg total) by mouth 2 (two) times daily. 60 tablet 2    clobetasol cream (TEMOVATE) 3.23 % APPLY ONE APPLICATION TOPICALLY TWO TIMES DAILY (Patient taking differently: Apply 1 application topically See admin instructions. APPLY ONE APPLICATION TOPICALLY TWO TIMES DAILY prn) 30 g 3   melatonin 1 MG TABS tablet Take 1 mg by mouth at bedtime as needed (sleep).     metoprolol tartrate (LOPRESSOR) 50 MG tablet Take 1 tablet by mouth 2  times daily. 60 tablet 2   Multiple Vitamin (MULITIVITAMIN WITH MINERALS) TABS Take 1 tablet by mouth daily.     pantoprazole (PROTONIX) 40 MG tablet TAKE 1 TABLET BY MOUTH  DAILY (Patient taking differently: 40 mg daily.) 90 tablet 3   PARoxetine (PAXIL) 20 MG tablet TAKE 1 TABLET BY MOUTH  TWICE DAILY (Patient taking differently: Take 40 mg by mouth daily.) 180 tablet 3   No current facility-administered medications on file prior to visit.   Allergies  Allergen Reactions   Morphine And Related     Severe HA per pt   Nitrofurantoin Nausea And Vomiting   Social History   Socioeconomic  History   Marital status: Divorced    Spouse name: Not on file   Number of children: Not on file   Years of education: Not on file   Highest education level: Not on file  Occupational History   Not on file  Tobacco Use   Smoking status: Every Day    Packs/day: 0.30    Types: Cigarettes    Last attempt to quit: 08/28/2011    Years since quitting: 10.3   Smokeless tobacco: Never  Substance and Sexual Activity   Alcohol use: No   Drug use: No   Sexual activity: Never  Other Topics Concern   Not on file  Social History Narrative   Not on file   Social Determinants of Health   Financial Resource Strain: Not on file  Food Insecurity: Not on file  Transportation Needs: Not on file  Physical Activity: Not on file  Stress: Not on file  Social Connections: Not on file  Intimate Partner Violence: Not on file     Review of Systems  All other systems reviewed and are negative.     Objective:   Physical Exam Vitals  reviewed.  Constitutional:      General: She is not in acute distress.    Appearance: Normal appearance. She is normal weight. She is not ill-appearing or toxic-appearing.  Neck:     Vascular: No carotid bruit.  Cardiovascular:     Rate and Rhythm: Normal rate. Rhythm irregular.     Pulses: Normal pulses.     Heart sounds: Normal heart sounds. No murmur heard.   No friction rub. No gallop.  Pulmonary:     Effort: Pulmonary effort is normal. No respiratory distress.     Breath sounds: Normal breath sounds. No stridor. No wheezing, rhonchi or rales.  Abdominal:     General: Abdomen is flat. Bowel sounds are normal.     Palpations: Abdomen is soft.  Musculoskeletal:     Right lower leg: No edema.     Left lower leg: No edema.  Neurological:     Mental Status: She is alert.          Assessment & Plan:  Atrial fibrillation, unspecified type ([place]) - Plan: CBC with Differential/Platelet, COMPLETE METABOLIC PANEL WITH GFR  Essential hypertension Patient appears euvolemic.  Heart rate is controlled on metoprolol.  She is appropriately anticoagulated with Eliquis.  There is no evidence of bleeding or bruising.  I will check a CBC to monitor her blood counts.  Given her hypokalemia in the hospital I will also check a CMP.  Her blood pressure is adequately controlled despite stopping her antihypertensive medication.  At this point her heart rate is adequately controlled so I would not increase the metoprolol.  She has cardioversion scheduled for February 2.  I will refill her Xanax 0.5 mg p.o. nightly as needed insomnia but I cautioned her to use the medicine sparingly to avoid addiction and habituation

## 2021-12-17 LAB — COMPLETE METABOLIC PANEL WITH GFR
AG Ratio: 1.8 (calc) (ref 1.0–2.5)
ALT: 49 U/L — ABNORMAL HIGH (ref 6–29)
AST: 24 U/L (ref 10–35)
Albumin: 3.8 g/dL (ref 3.6–5.1)
Alkaline phosphatase (APISO): 73 U/L (ref 37–153)
BUN: 12 mg/dL (ref 7–25)
CO2: 28 mmol/L (ref 20–32)
Calcium: 9.4 mg/dL (ref 8.6–10.4)
Chloride: 102 mmol/L (ref 98–110)
Creat: 0.84 mg/dL (ref 0.60–1.00)
Globulin: 2.1 g/dL (calc) (ref 1.9–3.7)
Glucose, Bld: 96 mg/dL (ref 65–99)
Potassium: 4.9 mmol/L (ref 3.5–5.3)
Sodium: 137 mmol/L (ref 135–146)
Total Bilirubin: 0.5 mg/dL (ref 0.2–1.2)
Total Protein: 5.9 g/dL — ABNORMAL LOW (ref 6.1–8.1)
eGFR: 72 mL/min/{1.73_m2} (ref >=60)

## 2021-12-17 LAB — CBC WITH DIFFERENTIAL/PLATELET
Absolute Monocytes: 1026 cells/uL — ABNORMAL HIGH (ref 200–950)
Basophils Absolute: 65 cells/uL (ref 0–200)
Basophils Relative: 0.6 %
Eosinophils Absolute: 130 cells/uL (ref 15–500)
Eosinophils Relative: 1.2 %
HCT: 40.2 % (ref 35.0–45.0)
Hemoglobin: 13.6 g/dL (ref 11.7–15.5)
Lymphs Abs: 1631 cells/uL (ref 850–3900)
MCH: 28.8 pg (ref 27.0–33.0)
MCHC: 33.8 g/dL (ref 32.0–36.0)
MCV: 85.2 fL (ref 80.0–100.0)
MPV: 11.8 fL (ref 7.5–12.5)
Monocytes Relative: 9.5 %
Neutro Abs: 7949 cells/uL — ABNORMAL HIGH (ref 1500–7800)
Neutrophils Relative %: 73.6 %
Platelets: 226 10*3/uL (ref 140–400)
RBC: 4.72 10*6/uL (ref 3.80–5.10)
RDW: 12.2 % (ref 11.0–15.0)
Total Lymphocyte: 15.1 %
WBC: 10.8 10*3/uL (ref 3.8–10.8)

## 2021-12-29 ENCOUNTER — Ambulatory Visit (HOSPITAL_COMMUNITY)
Admission: RE | Admit: 2021-12-29 | Discharge: 2021-12-29 | Disposition: A | Discharge status: Home / Self Care | Payer: Medicare Other | Source: Ambulatory Visit | Attending: Nurse Practitioner | Admitting: Nurse Practitioner

## 2021-12-29 ENCOUNTER — Encounter (HOSPITAL_COMMUNITY): Payer: Self-pay | Admitting: Nurse Practitioner

## 2021-12-29 ENCOUNTER — Other Ambulatory Visit: Payer: Self-pay

## 2021-12-29 VITALS — BP 156/104 | HR 162 | Ht 63.0 in | Wt 190.2 lb

## 2021-12-29 DIAGNOSIS — I10 Essential (primary) hypertension: Secondary | ICD-10-CM | POA: Diagnosis not present

## 2021-12-29 DIAGNOSIS — F32A Depression, unspecified: Secondary | ICD-10-CM | POA: Insufficient documentation

## 2021-12-29 DIAGNOSIS — I4891 Unspecified atrial fibrillation: Secondary | ICD-10-CM | POA: Insufficient documentation

## 2021-12-29 DIAGNOSIS — F419 Anxiety disorder, unspecified: Secondary | ICD-10-CM | POA: Diagnosis not present

## 2021-12-29 DIAGNOSIS — Z8249 Family history of ischemic heart disease and other diseases of the circulatory system: Secondary | ICD-10-CM | POA: Diagnosis not present

## 2021-12-29 DIAGNOSIS — Z7901 Long term (current) use of anticoagulants: Secondary | ICD-10-CM | POA: Diagnosis not present

## 2021-12-29 DIAGNOSIS — Z79899 Other long term (current) drug therapy: Secondary | ICD-10-CM | POA: Insufficient documentation

## 2021-12-29 MED ORDER — FUROSEMIDE 40 MG PO TABS: 40.0000 mg | ORAL_TABLET | Freq: Every day | ORAL | 2 refills | Status: DC

## 2021-12-29 MED ORDER — APIXABAN 5 MG PO TABS: 5.0000 mg | ORAL_TABLET | Freq: Two times a day (BID) | ORAL | 2 refills | Status: DC

## 2021-12-29 MED ORDER — POTASSIUM CHLORIDE CRYS ER 20 MEQ PO TBCR: 20.0000 meq | EXTENDED_RELEASE_TABLET | Freq: Every day | ORAL | 2 refills | Status: DC

## 2021-12-29 MED ORDER — METOPROLOL TARTRATE 50 MG PO TABS: 50.0000 mg | ORAL_TABLET | Freq: Two times a day (BID) | ORAL | 2 refills | Status: DC

## 2021-12-29 NOTE — Patient Instructions (Signed)
Start lasix 40mg  once a day Start Potassium 34meq once a day  Be sure to take your metoprolol 50mg  twice a day  and Eliquis 5mg  twice a day

## 2021-12-29 NOTE — Progress Notes (Addendum)
Primary Care Physician: Susy Frizzle, MD Referring Physician: Hospital F/u    Kristina Dougherty is a 75 y.o. female with a h/o HTN, anxiety/depression, that was admitted 12/04/21 to 12/06/21 at [facility] with new onset afib with RVR. She was rate controlled with IV Cardizem and then switched to metoprolol 50 mg bid.  She was also started on eliquis 5 mg bid for a CHA2DS2VASc  score of 4.   In the clinic today, her EKG shows afib with RVR at 162. She has gained 10  lbs of fluid. She is having shortness of breath  and she stopped both eliquis and metoprolol as she felt the drugs were making her worse. She feels very full thru her abdomen which effects her appetite and has ++ pedal edema, does not describe PND/orthopnea. Her HCTZ was stopped in the hospital. She is here with her son today. She had a fall last week but was not on anticoagulation at the time.    Today, she denies symptoms of palpitations, chest pain, shortness of breath, orthopnea, PND, lower extremity edema, dizziness, presyncope, syncope, or neurologic sequela. The patient is tolerating medications without difficulties and is otherwise without complaint today.   Past Medical History:  Diagnosis Date   Anxiety    Arthritis    Atrial fibrillation (HCC)    Depression    Hypertension    Osteopenia    Past Surgical History:  Procedure Laterality Date   ABDOMINAL HYSTERECTOMY     APPENDECTOMY      Current Outpatient Medications  Medication Sig Dispense Refill   ALPRAZolam (XANAX) 0.5 MG tablet Take 1 tablet (0.5 mg total) by mouth 3 (three) times daily as needed for anxiety. TAKE 1 TABLET BY MOUTH THREE TIMES DAILY AS NEEDED FOR ANXIETY OR SLEEP 30 tablet 0   apixaban (ELIQUIS) 5 MG TABS tablet Take 1 tablet (5 mg total) by mouth 2 (two) times daily. 60 tablet 2   clobetasol cream (TEMOVATE) 6.37 % APPLY ONE APPLICATION TOPICALLY TWO TIMES DAILY (Patient taking differently: Apply 1 application topically See admin instructions.  APPLY ONE APPLICATION TOPICALLY TWO TIMES DAILY prn) 30 g 3   melatonin 1 MG TABS tablet Take 1 mg by mouth at bedtime as needed (sleep).     metoprolol tartrate (LOPRESSOR) 50 MG tablet Take 1 tablet by mouth 2  times daily. 60 tablet 2   PARoxetine (PAXIL) 20 MG tablet TAKE 1 TABLET BY MOUTH  TWICE DAILY 180 tablet 3   Multiple Vitamin (MULITIVITAMIN WITH MINERALS) TABS Take 1 tablet by mouth daily. (Patient not taking: Reported on 12/29/2021)     pantoprazole (PROTONIX) 40 MG tablet TAKE 1 TABLET BY MOUTH  DAILY (Patient not taking: Reported on 12/29/2021) 90 tablet 3   No current facility-administered medications for this encounter.    Allergies  Allergen Reactions   Morphine And Related     Severe HA per pt   Nitrofurantoin Nausea And Vomiting    Social History   Socioeconomic History   Marital status: Divorced    Spouse name: Not on file   Number of children: Not on file   Years of education: Not on file   Highest education level: Not on file  Occupational History   Not on file  Tobacco Use   Smoking status: Every Day    Packs/day: 0.30    Types: Cigarettes    Last attempt to quit: 08/28/2011    Years since quitting: 10.3   Smokeless tobacco: Never  Substance and Sexual Activity   Alcohol use: No   Drug use: No   Sexual activity: Never  Other Topics Concern   Not on file  Social History Narrative   Not on file   Social Determinants of Health   Financial Resource Strain: Not on file  Food Insecurity: Not on file  Transportation Needs: Not on file  Physical Activity: Not on file  Stress: Not on file  Social Connections: Not on file  Intimate Partner Violence: Not on file    Family History  Problem Relation Age of Onset   Hypertension Maternal Grandmother     ROS- All systems are reviewed and negative except as per the HPI above  Physical Exam: Vitals:   12/29/21 1401  BP: (!) 156/104  Pulse: (!) 162  Weight: 86.3 kg  Height: 5\' 3"  (1.6 m)   Wt  Readings from Last 3 Encounters:  12/29/21 86.3 kg  12/16/21 81.6 kg  12/05/21 82.4 kg    Labs: Lab Results  Component Value Date   NA 137 12/16/2021   K 4.9 12/16/2021   CL 102 12/16/2021   CO2 28 12/16/2021   GLUCOSE 96 12/16/2021   BUN 12 12/16/2021   CREATININE 0.84 12/16/2021   CALCIUM 9.4 12/16/2021   MG 2.3 12/06/2021   No results found for: INR Lab Results  Component Value Date   CHOL 237 (H) 11/22/2016   HDL 71 11/22/2016   LDLCALC 148 (H) 11/22/2016   TRIG 90 11/22/2016     GEN- The patient is well appearing, alert and oriented x 3 today.   Head- normocephalic, atraumatic Eyes-  Sclera clear, conjunctiva pink Ears- hearing intact Oropharynx- clear Neck- supple, no JVP Lymph- no cervical lymphadenopathy Lungs- Clear to ausculation bilaterally, normal work of breathing Heart- rapid irregular rate and rhythm, no murmurs, rubs or gallops, PMI not laterally displaced GI- distended,  + BS Extremities- no clubbing, cyanosis, or ++ bilateral  edema MS- no significant deformity or atrophy Skin- no rash or lesion Psych- euthymic mood, full affect Neuro- strength and sensation are intact  EKG-afib at 160 bpm, qrs int 250 ms, qtc 410 ms.   Echo-1. Left ventricular ejection fraction, by estimation, is 55 to 60%. The  left ventricle has normal function. The left ventricle has no regional  wall motion abnormalities. Left ventricular diastolic parameters are  indeterminate.   2. Right ventricular systolic function is normal. The right ventricular  size is normal. There is normal pulmonary artery systolic pressure. The  estimated right ventricular systolic pressure is 18.8 mmHg.   3. The mitral valve is normal in structure. Mild mitral valve  regurgitation. No evidence of mitral stenosis.   4. The aortic valve is tricuspid. Aortic valve regurgitation is not  visualized. No aortic stenosis is present.   5. The inferior vena cava is normal in size with greater than  50%  respiratory variability, suggesting right atrial pressure of 3 mmHg.   6. Cannot exclude a small PFO.    Assessment and Plan:  1. New onset afib with RVR Pt has not been taking metoprolol and eliquis as she has been more short of breath with abdominal and pedal swelling and thought it was the drugs I explained to pt I thought dyspnea was secondary to rapid afib and 10 lb weight gain I will start lasix 40 mg daily with 20 meq K+ daily She was asked to get scales and do daily weights She was  explained the importance of taking  metoprolol 50 mg bid as the "brake " drug to slow down HR She states that she will take drug and understands importance  2. CHA2DS2VASc  score of at least 4 Importance to treat afib was explained to pt and to lower stroke risk and get her back in SR she will need to take eliquis  bid without missed doses   I will see back Tuesday but she was encouraged to have a low bar to return to the ER if symptoms escalate  Butch Penny C. Kema Santaella, [facility] [address] [place], [place] [address] [phone]

## 2022-01-03 ENCOUNTER — Other Ambulatory Visit: Payer: Self-pay

## 2022-01-03 ENCOUNTER — Ambulatory Visit (HOSPITAL_COMMUNITY)
Admission: RE | Admit: 2022-01-03 | Discharge: 2022-01-03 | Disposition: A | Discharge status: Home / Self Care | Payer: Medicare Other | Source: Ambulatory Visit | Attending: Nurse Practitioner | Admitting: Nurse Practitioner

## 2022-01-03 ENCOUNTER — Encounter (HOSPITAL_COMMUNITY): Payer: Self-pay | Admitting: Nurse Practitioner

## 2022-01-03 VITALS — BP 104/76 | HR 132 | Ht 63.0 in | Wt 184.2 lb

## 2022-01-03 DIAGNOSIS — Z7901 Long term (current) use of anticoagulants: Secondary | ICD-10-CM | POA: Insufficient documentation

## 2022-01-03 DIAGNOSIS — I4891 Unspecified atrial fibrillation: Secondary | ICD-10-CM | POA: Diagnosis not present

## 2022-01-03 DIAGNOSIS — F419 Anxiety disorder, unspecified: Secondary | ICD-10-CM | POA: Diagnosis not present

## 2022-01-03 DIAGNOSIS — I10 Essential (primary) hypertension: Secondary | ICD-10-CM | POA: Diagnosis not present

## 2022-01-03 DIAGNOSIS — F32A Depression, unspecified: Secondary | ICD-10-CM | POA: Diagnosis not present

## 2022-01-03 DIAGNOSIS — R5383 Other fatigue: Secondary | ICD-10-CM | POA: Diagnosis not present

## 2022-01-03 DIAGNOSIS — D6869 Other thrombophilia: Secondary | ICD-10-CM | POA: Diagnosis not present

## 2022-01-03 DIAGNOSIS — R0602 Shortness of breath: Secondary | ICD-10-CM | POA: Diagnosis not present

## 2022-01-03 LAB — CBC
HCT: 42.8 % (ref 36.0–46.0)
Hemoglobin: 13.9 g/dL (ref 12.0–15.0)
MCH: 27.5 pg (ref 26.0–34.0)
MCHC: 32.5 g/dL (ref 30.0–36.0)
MCV: 84.6 fL (ref 80.0–100.0)
Platelets: 239 10*3/uL (ref 150–400)
RBC: 5.06 MIL/uL (ref 3.87–5.11)
RDW: 13.1 % (ref 11.5–15.5)
WBC: 8.9 10*3/uL (ref 4.0–10.5)
nRBC: 0 % (ref 0.0–0.2)

## 2022-01-03 LAB — BASIC METABOLIC PANEL
Anion gap: 10 (ref 5–15)
BUN: 13 mg/dL (ref 8–23)
CO2: 28 mmol/L (ref 22–32)
Calcium: 9.2 mg/dL (ref 8.9–10.3)
Chloride: 102 mmol/L (ref 98–111)
Creatinine, Ser: 1.04 mg/dL — ABNORMAL HIGH (ref 0.44–1.00)
GFR, Estimated: 55 mL/min — ABNORMAL LOW (ref >60)
Glucose, Bld: 117 mg/dL — ABNORMAL HIGH (ref 70–99)
Potassium: 3.8 mmol/L (ref 3.5–5.1)
Sodium: 140 mmol/L (ref 135–145)

## 2022-01-03 NOTE — Patient Instructions (Addendum)
Day of cardioversion reduce metoprolol back to 1/2 tablet twice a day (which is 25mg  twice a day)    Cardioversion scheduled for Monday, February 13th  - Arrive at the [facility] and go to admitting at 730AM  - Do not eat or drink anything after midnight the night prior to your procedure.  - Take all your morning medication (except diabetic medications) with a sip of water prior to arrival.  - You will not be able to drive home after your procedure.  - Do NOT miss any doses of your blood thinner - if you should miss a dose please notify our office immediately.  - If you feel as if you go back into normal rhythm prior to scheduled cardioversion, please notify our office immediately. If your procedure is canceled in the cardioversion suite you will be charged a cancellation fee.

## 2022-01-03 NOTE — H&P (View-Only) (Signed)
Primary Care Physician: Susy Frizzle, MD Referring Physician: Hospital F/u    Kristina Dougherty is a 76 y.o. female with a h/o HTN, anxiety/depression, that was admitted 12/04/21 to 12/06/21 at [facility] with new onset afib with RVR. She was rate controlled with IV Cardizem and then switched to metoprolol 50 mg bid.  She was also started on eliquis 5 mg bid for a CHA2DS2VASc  score of 4.   In the clinic on f/u, her EKG showed afib with RVR at 162 bpm. She had gained 10  lbs of fluid. She is having shortness of breath  and she stopped both eliquis and metoprolol as she felt the drugs were making her worse. She feels very full thru her abdomen which effects her appetite and has ++ pedal edema, does not describe PND/orthopnea. Her HCTZ was stopped in the hospital. She is here with her son today. She had a fall last week but was not on anticoagulation at the time.    F/u in the afib clinic, 01/03/22. She now has had on her scales at home, in her PJ's, around a 10 lb weight loss, since lasix was started last visit  here with clothes, 6 lbs. Since back on metoprolol, she is now running 117-130 bpm in afib  vrs 160bpm last week when I saw her not on metoprolol. She has been taking her eliquis on  a regular basis since last week. She feels improved but still with a lot of fatigue and shortness of breath. Still no PND/orthopnea. We discussed that she has a soft BP limiting up titration o BB. Since she has not been on anticoagulation x 3 weeks, and I am concerned she may have deterioration in her status,   will go ahead and schedule TEE/cardioversion for next Monday, which is next available. She and her son are in agreement. Still remind them to have a low threshold to go to ER if symptoms worsen.   Today, she denies symptoms of palpitations, chest pain, shortness of breath, orthopnea, PND, lower extremity edema, dizziness, presyncope, syncope, or neurologic sequela. The patient is tolerating medications without  difficulties and is otherwise without complaint today.   Past Medical History:  Diagnosis Date   Anxiety    Arthritis    Atrial fibrillation (HCC)    Depression    Hypertension    Osteopenia    Past Surgical History:  Procedure Laterality Date   ABDOMINAL HYSTERECTOMY     APPENDECTOMY      Current Outpatient Medications  Medication Sig Dispense Refill   ALPRAZolam (XANAX) 0.5 MG tablet Take 1 tablet (0.5 mg total) by mouth 3 (three) times daily as needed for anxiety. TAKE 1 TABLET BY MOUTH THREE TIMES DAILY AS NEEDED FOR ANXIETY OR SLEEP 30 tablet 0   apixaban (ELIQUIS) 5 MG TABS tablet Take 1 tablet (5 mg total) by mouth 2 (two) times daily. 60 tablet 2   clobetasol cream (TEMOVATE) 0.63 % APPLY ONE APPLICATION TOPICALLY TWO TIMES DAILY (Patient taking differently: Apply 1 application topically See admin instructions. APPLY ONE APPLICATION TOPICALLY TWO TIMES DAILY prn) 30 g 3   furosemide (LASIX) 40 MG tablet Take 1 tablet (40 mg total) by mouth daily. 30 tablet 2   melatonin 1 MG TABS tablet Take 1 mg by mouth at bedtime as needed (sleep).     PARoxetine (PAXIL) 20 MG tablet TAKE 1 TABLET BY MOUTH  TWICE DAILY 180 tablet 3   potassium chloride SA (KLOR-CON M) 20 MEQ  tablet Take 1 tablet (20 mEq total) by mouth daily. 30 tablet 2   metoprolol tartrate (LOPRESSOR) 50 MG tablet Take 1 tablet by mouth 2  times daily. 60 tablet 2   Multiple Vitamin (MULITIVITAMIN WITH MINERALS) TABS Take 1 tablet by mouth daily. (Patient not taking: Reported on 12/29/2021)     pantoprazole (PROTONIX) 40 MG tablet TAKE 1 TABLET BY MOUTH  DAILY (Patient not taking: Reported on 12/29/2021) 90 tablet 3   No current facility-administered medications for this encounter.    Allergies  Allergen Reactions   Morphine And Related     Severe HA per pt   Nitrofurantoin Nausea And Vomiting    Social History   Socioeconomic History   Marital status: Divorced    Spouse name: Not on file   Number of children:  Not on file   Years of education: Not on file   Highest education level: Not on file  Occupational History   Not on file  Tobacco Use   Smoking status: Every Day    Packs/day: 0.30    Types: Cigarettes    Last attempt to quit: 08/28/2011    Years since quitting: 10.3   Smokeless tobacco: Never  Substance and Sexual Activity   Alcohol use: No   Drug use: No   Sexual activity: Never  Other Topics Concern   Not on file  Social History Narrative   Not on file   Social Determinants of Health   Financial Resource Strain: Not on file  Food Insecurity: Not on file  Transportation Needs: Not on file  Physical Activity: Not on file  Stress: Not on file  Social Connections: Not on file  Intimate Partner Violence: Not on file    Family History  Problem Relation Age of Onset   Hypertension Maternal Grandmother     ROS- All systems are reviewed and negative except as per the HPI above  Physical Exam: Vitals:   01/03/22 1041  BP: 104/76  Pulse: (!) 132  Weight: 83.6 kg  Height: 5\' 3"  (1.6 m)   Wt Readings from Last 3 Encounters:  01/03/22 83.6 kg  12/29/21 86.3 kg  12/16/21 81.6 kg    Labs: Lab Results  Component Value Date   NA 137 12/16/2021   K 4.9 12/16/2021   CL 102 12/16/2021   CO2 28 12/16/2021   GLUCOSE 96 12/16/2021   BUN 12 12/16/2021   CREATININE 0.84 12/16/2021   CALCIUM 9.4 12/16/2021   MG 2.3 12/06/2021   No results found for: INR Lab Results  Component Value Date   CHOL 237 (H) 11/22/2016   HDL 71 11/22/2016   LDLCALC 148 (H) 11/22/2016   TRIG 90 11/22/2016     GEN- The patient is well appearing, alert and oriented x 3 today.   Head- normocephalic, atraumatic Eyes-  Sclera clear, conjunctiva pink Ears- hearing intact Oropharynx- clear Neck- supple, no JVP Lymph- no cervical lymphadenopathy Lungs- Clear to ausculation bilaterally, normal work of breathing Heart- rapid irregular rate and rhythm, no murmurs, rubs or gallops, PMI not  laterally displaced GI- distended,  + BS Extremities- no clubbing, cyanosis, or ++ bilateral  edema MS- no significant deformity or atrophy Skin- no rash or lesion Psych- euthymic mood, full affect Neuro- strength and sensation are intact  EKG-afib at 117- 130 bpm, qrs int 250 ms, qtc 410 ms.   Echo-1. Left ventricular ejection fraction, by estimation, is 55 to 60%. The  left ventricle has normal function. The left ventricle  has no regional  wall motion abnormalities. Left ventricular diastolic parameters are  indeterminate.   2. Right ventricular systolic function is normal. The right ventricular  size is normal. There is normal pulmonary artery systolic pressure. The  estimated right ventricular systolic pressure is 94.5 mmHg.   3. The mitral valve is normal in structure. Mild mitral valve  regurgitation. No evidence of mitral stenosis.   4. The aortic valve is tricuspid. Aortic valve regurgitation is not  visualized. No aortic stenosis is present.   5. The inferior vena cava is normal in size with greater than 50%  respiratory variability, suggesting right atrial pressure of 3 mmHg.   6. Cannot exclude a small PFO.    Assessment and Plan:  1. New onset afib with RVR Pt had not been taking metoprolol and eliquis as she has been more short of breath with abdominal and pedal swelling and thought it was the drugs I explained to pt I thought dyspnea was secondary to rapid afib and 10 lb weight gain She has had 10 lb weight loss with lasix 40 mg daily with 20 meq K+ daily started on 2/2 Bmet/cbc today  She has been taking metoprolol and eliquis on a regular basis since 2/2 I feel that a TEE/Cardioversion should  be pursued to get pt back in rhythm ASAP, first available CV 213, as I feel she may be at risk for deterioration.   Risk vrs benefit of TEE/CV explained to pt and son  Her systolic is 038 today and I am concerned to push up does of BB I cant send to ER for cardioversion as  she has not been on anticoagulation x 21 days without interruption.  Continue  daily weights  Again, stressed to have low tolerance to go to ER if condition worsens   2. CHA2DS2VASc  score of at least 4 Importance to treat afib was explained to pt and to lower stroke risk and get her back in SR she will need to take eliquis  bid without missed doses   I will see back one week after TEE/ cardioversion   Butch Penny C. Jillian Warth, [facility] [address] [place], [place] [address] [phone]

## 2022-01-03 NOTE — Progress Notes (Signed)
Primary Care Physician: Susy Frizzle, MD Referring Physician: Hospital F/u    Kristina Dougherty is a 76 y.o. female with a h/o HTN, anxiety/depression, that was admitted 12/04/21 to 12/06/21 at [facility] with new onset afib with RVR. She was rate controlled with IV Cardizem and then switched to metoprolol 50 mg bid.  She was also started on eliquis 5 mg bid for a CHA2DS2VASc  score of 4.   In the clinic on f/u, her EKG showed afib with RVR at 162 bpm. She had gained 10  lbs of fluid. She is having shortness of breath  and she stopped both eliquis and metoprolol as she felt the drugs were making her worse. She feels very full thru her abdomen which effects her appetite and has ++ pedal edema, does not describe PND/orthopnea. Her HCTZ was stopped in the hospital. She is here with her son today. She had a fall last week but was not on anticoagulation at the time.    F/u in the afib clinic, 01/03/22. She now has had on her scales at home, in her PJ's, around a 10 lb weight loss, since lasix was started last visit  here with clothes, 6 lbs. Since back on metoprolol, she is now running 117-130 bpm in afib  vrs 160bpm last week when I saw her not on metoprolol. She has been taking her eliquis on  a regular basis since last week. She feels improved but still with a lot of fatigue and shortness of breath. Still no PND/orthopnea. We discussed that she has a soft BP limiting up titration o BB. Since she has not been on anticoagulation x 3 weeks, and I am concerned she may have deterioration in her status,   will go ahead and schedule TEE/cardioversion for next Monday, which is next available. She and her son are in agreement. Still remind them to have a low threshold to go to ER if symptoms worsen.   Today, she denies symptoms of palpitations, chest pain, shortness of breath, orthopnea, PND, lower extremity edema, dizziness, presyncope, syncope, or neurologic sequela. The patient is tolerating medications without  difficulties and is otherwise without complaint today.   Past Medical History:  Diagnosis Date   Anxiety    Arthritis    Atrial fibrillation (HCC)    Depression    Hypertension    Osteopenia    Past Surgical History:  Procedure Laterality Date   ABDOMINAL HYSTERECTOMY     APPENDECTOMY      Current Outpatient Medications  Medication Sig Dispense Refill   ALPRAZolam (XANAX) 0.5 MG tablet Take 1 tablet (0.5 mg total) by mouth 3 (three) times daily as needed for anxiety. TAKE 1 TABLET BY MOUTH THREE TIMES DAILY AS NEEDED FOR ANXIETY OR SLEEP 30 tablet 0   apixaban (ELIQUIS) 5 MG TABS tablet Take 1 tablet (5 mg total) by mouth 2 (two) times daily. 60 tablet 2   clobetasol cream (TEMOVATE) 4.31 % APPLY ONE APPLICATION TOPICALLY TWO TIMES DAILY (Patient taking differently: Apply 1 application topically See admin instructions. APPLY ONE APPLICATION TOPICALLY TWO TIMES DAILY prn) 30 g 3   furosemide (LASIX) 40 MG tablet Take 1 tablet (40 mg total) by mouth daily. 30 tablet 2   melatonin 1 MG TABS tablet Take 1 mg by mouth at bedtime as needed (sleep).     PARoxetine (PAXIL) 20 MG tablet TAKE 1 TABLET BY MOUTH  TWICE DAILY 180 tablet 3   potassium chloride SA (KLOR-CON M) 20 MEQ  tablet Take 1 tablet (20 mEq total) by mouth daily. 30 tablet 2   metoprolol tartrate (LOPRESSOR) 50 MG tablet Take 1 tablet by mouth 2  times daily. 60 tablet 2   Multiple Vitamin (MULITIVITAMIN WITH MINERALS) TABS Take 1 tablet by mouth daily. (Patient not taking: Reported on 12/29/2021)     pantoprazole (PROTONIX) 40 MG tablet TAKE 1 TABLET BY MOUTH  DAILY (Patient not taking: Reported on 12/29/2021) 90 tablet 3   No current facility-administered medications for this encounter.    Allergies  Allergen Reactions   Morphine And Related     Severe HA per pt   Nitrofurantoin Nausea And Vomiting    Social History   Socioeconomic History   Marital status: Divorced    Spouse name: Not on file   Number of children:  Not on file   Years of education: Not on file   Highest education level: Not on file  Occupational History   Not on file  Tobacco Use   Smoking status: Every Day    Packs/day: 0.30    Types: Cigarettes    Last attempt to quit: 08/28/2011    Years since quitting: 10.3   Smokeless tobacco: Never  Substance and Sexual Activity   Alcohol use: No   Drug use: No   Sexual activity: Never  Other Topics Concern   Not on file  Social History Narrative   Not on file   Social Determinants of Health   Financial Resource Strain: Not on file  Food Insecurity: Not on file  Transportation Needs: Not on file  Physical Activity: Not on file  Stress: Not on file  Social Connections: Not on file  Intimate Partner Violence: Not on file    Family History  Problem Relation Age of Onset   Hypertension Maternal Grandmother     ROS- All systems are reviewed and negative except as per the HPI above  Physical Exam: Vitals:   01/03/22 1041  BP: 104/76  Pulse: (!) 132  Weight: 83.6 kg  Height: 5\' 3"  (1.6 m)   Wt Readings from Last 3 Encounters:  01/03/22 83.6 kg  12/29/21 86.3 kg  12/16/21 81.6 kg    Labs: Lab Results  Component Value Date   NA 137 12/16/2021   K 4.9 12/16/2021   CL 102 12/16/2021   CO2 28 12/16/2021   GLUCOSE 96 12/16/2021   BUN 12 12/16/2021   CREATININE 0.84 12/16/2021   CALCIUM 9.4 12/16/2021   MG 2.3 12/06/2021   No results found for: INR Lab Results  Component Value Date   CHOL 237 (H) 11/22/2016   HDL 71 11/22/2016   LDLCALC 148 (H) 11/22/2016   TRIG 90 11/22/2016     GEN- The patient is well appearing, alert and oriented x 3 today.   Head- normocephalic, atraumatic Eyes-  Sclera clear, conjunctiva pink Ears- hearing intact Oropharynx- clear Neck- supple, no JVP Lymph- no cervical lymphadenopathy Lungs- Clear to ausculation bilaterally, normal work of breathing Heart- rapid irregular rate and rhythm, no murmurs, rubs or gallops, PMI not  laterally displaced GI- distended,  + BS Extremities- no clubbing, cyanosis, or ++ bilateral  edema MS- no significant deformity or atrophy Skin- no rash or lesion Psych- euthymic mood, full affect Neuro- strength and sensation are intact  EKG-afib at 117- 130 bpm, qrs int 250 ms, qtc 410 ms.   Echo-1. Left ventricular ejection fraction, by estimation, is 55 to 60%. The  left ventricle has normal function. The left ventricle  has no regional  wall motion abnormalities. Left ventricular diastolic parameters are  indeterminate.   2. Right ventricular systolic function is normal. The right ventricular  size is normal. There is normal pulmonary artery systolic pressure. The  estimated right ventricular systolic pressure is 89.1 mmHg.   3. The mitral valve is normal in structure. Mild mitral valve  regurgitation. No evidence of mitral stenosis.   4. The aortic valve is tricuspid. Aortic valve regurgitation is not  visualized. No aortic stenosis is present.   5. The inferior vena cava is normal in size with greater than 50%  respiratory variability, suggesting right atrial pressure of 3 mmHg.   6. Cannot exclude a small PFO.    Assessment and Plan:  1. New onset afib with RVR Pt had not been taking metoprolol and eliquis as she has been more short of breath with abdominal and pedal swelling and thought it was the drugs I explained to pt I thought dyspnea was secondary to rapid afib and 10 lb weight gain She has had 10 lb weight loss with lasix 40 mg daily with 20 meq K+ daily started on 2/2 Bmet/cbc today  She has been taking metoprolol and eliquis on a regular basis since 2/2 I feel that a TEE/Cardioversion should  be pursued to get pt back in rhythm ASAP, first available CV 213, as I feel she may be at risk for deterioration.   Risk vrs benefit of TEE/CV explained to pt and son  Her systolic is 694 today and I am concerned to push up does of BB I cant send to ER for cardioversion as  she has not been on anticoagulation x 21 days without interruption.  Continue  daily weights  Again, stressed to have low tolerance to go to ER if condition worsens   2. CHA2DS2VASc  score of at least 4 Importance to treat afib was explained to pt and to lower stroke risk and get her back in SR she will need to take eliquis  bid without missed doses   I will see back one week after TEE/ cardioversion   Butch Penny C. Walker Sitar, [facility] [address] [place], [place] [address] [phone]

## 2022-01-09 ENCOUNTER — Ambulatory Visit (HOSPITAL_BASED_OUTPATIENT_CLINIC_OR_DEPARTMENT_OTHER): Payer: Medicare Other

## 2022-01-09 ENCOUNTER — Emergency Department (HOSPITAL_COMMUNITY): Payer: Medicare Other

## 2022-01-09 ENCOUNTER — Other Ambulatory Visit: Payer: Self-pay

## 2022-01-09 ENCOUNTER — Ambulatory Visit (HOSPITAL_BASED_OUTPATIENT_CLINIC_OR_DEPARTMENT_OTHER): Payer: Medicare Other | Admitting: Certified Registered Nurse Anesthetist

## 2022-01-09 ENCOUNTER — Encounter (HOSPITAL_COMMUNITY): Payer: Self-pay | Admitting: Cardiovascular Disease

## 2022-01-09 ENCOUNTER — Ambulatory Visit (HOSPITAL_COMMUNITY): Admission: RE | Admit: 2022-01-09 | Payer: Medicare Other | Source: Home / Self Care | Admitting: Cardiovascular Disease

## 2022-01-09 ENCOUNTER — Ambulatory Visit (HOSPITAL_COMMUNITY)
Admission: AD | Admit: 2022-01-09 | Discharge: 2022-01-09 | Disposition: A | Discharge status: Home / Self Care | Payer: Medicare Other | Source: Ambulatory Visit | Attending: Cardiovascular Disease | Admitting: Cardiovascular Disease

## 2022-01-09 ENCOUNTER — Ambulatory Visit (HOSPITAL_COMMUNITY): Payer: Medicare Other | Admitting: Certified Registered Nurse Anesthetist

## 2022-01-09 ENCOUNTER — Encounter (HOSPITAL_COMMUNITY): Payer: Self-pay | Admitting: Emergency Medicine

## 2022-01-09 ENCOUNTER — Emergency Department (HOSPITAL_COMMUNITY)
Admission: EM | Admit: 2022-01-09 | Discharge: 2022-01-09 | Disposition: A | Discharge status: Home / Self Care | Payer: Medicare Other | Source: Home / Self Care | Attending: Emergency Medicine | Admitting: Emergency Medicine

## 2022-01-09 ENCOUNTER — Encounter (HOSPITAL_COMMUNITY)
Admission: AD | Disposition: A | Discharge status: Home / Self Care | Payer: Medicare Other | Source: Ambulatory Visit | Attending: Cardiovascular Disease

## 2022-01-09 DIAGNOSIS — I4891 Unspecified atrial fibrillation: Secondary | ICD-10-CM | POA: Insufficient documentation

## 2022-01-09 DIAGNOSIS — I10 Essential (primary) hypertension: Secondary | ICD-10-CM

## 2022-01-09 DIAGNOSIS — J90 Pleural effusion, not elsewhere classified: Secondary | ICD-10-CM | POA: Diagnosis not present

## 2022-01-09 DIAGNOSIS — I517 Cardiomegaly: Secondary | ICD-10-CM | POA: Diagnosis not present

## 2022-01-09 DIAGNOSIS — I4819 Other persistent atrial fibrillation: Secondary | ICD-10-CM

## 2022-01-09 DIAGNOSIS — F419 Anxiety disorder, unspecified: Secondary | ICD-10-CM | POA: Insufficient documentation

## 2022-01-09 DIAGNOSIS — R109 Unspecified abdominal pain: Secondary | ICD-10-CM

## 2022-01-09 DIAGNOSIS — Z7901 Long term (current) use of anticoagulants: Secondary | ICD-10-CM

## 2022-01-09 DIAGNOSIS — R1010 Upper abdominal pain, unspecified: Secondary | ICD-10-CM | POA: Insufficient documentation

## 2022-01-09 DIAGNOSIS — I081 Rheumatic disorders of both mitral and tricuspid valves: Secondary | ICD-10-CM

## 2022-01-09 DIAGNOSIS — F32A Depression, unspecified: Secondary | ICD-10-CM | POA: Insufficient documentation

## 2022-01-09 DIAGNOSIS — Q2112 Patent foramen ovale: Secondary | ICD-10-CM | POA: Diagnosis not present

## 2022-01-09 DIAGNOSIS — R7401 Elevation of levels of liver transaminase levels: Secondary | ICD-10-CM | POA: Insufficient documentation

## 2022-01-09 DIAGNOSIS — R112 Nausea with vomiting, unspecified: Secondary | ICD-10-CM | POA: Diagnosis not present

## 2022-01-09 DIAGNOSIS — I700 Atherosclerosis of aorta: Secondary | ICD-10-CM | POA: Diagnosis not present

## 2022-01-09 DIAGNOSIS — R7989 Other specified abnormal findings of blood chemistry: Secondary | ICD-10-CM

## 2022-01-09 DIAGNOSIS — Z79899 Other long term (current) drug therapy: Secondary | ICD-10-CM | POA: Insufficient documentation

## 2022-01-09 DIAGNOSIS — J9811 Atelectasis: Secondary | ICD-10-CM | POA: Diagnosis not present

## 2022-01-09 HISTORY — PX: TEE WITHOUT CARDIOVERSION: SHX5443

## 2022-01-09 HISTORY — PX: CARDIOVERSION: SHX1299

## 2022-01-09 HISTORY — DX: Unspecified hearing loss, right ear: H91.91

## 2022-01-09 HISTORY — PX: BUBBLE STUDY: SHX6837

## 2022-01-09 LAB — CBC WITH DIFFERENTIAL/PLATELET
Abs Immature Granulocytes: 0.05 10*3/uL (ref 0.00–0.07)
Basophils Absolute: 0.1 10*3/uL (ref 0.0–0.1)
Basophils Relative: 1 %
Eosinophils Absolute: 0 10*3/uL (ref 0.0–0.5)
Eosinophils Relative: 0 %
HCT: 45.7 % (ref 36.0–46.0)
Hemoglobin: 14.9 g/dL (ref 12.0–15.0)
Immature Granulocytes: 0 %
Lymphocytes Relative: 8 %
Lymphs Abs: 1.2 10*3/uL (ref 0.7–4.0)
MCH: 27.7 pg (ref 26.0–34.0)
MCHC: 32.6 g/dL (ref 30.0–36.0)
MCV: 85.1 fL (ref 80.0–100.0)
Monocytes Absolute: 0.9 10*3/uL (ref 0.1–1.0)
Monocytes Relative: 7 %
Neutro Abs: 11.9 10*3/uL — ABNORMAL HIGH (ref 1.7–7.7)
Neutrophils Relative %: 84 %
Platelets: 213 10*3/uL (ref 150–400)
RBC: 5.37 MIL/uL — ABNORMAL HIGH (ref 3.87–5.11)
RDW: 13.3 % (ref 11.5–15.5)
WBC: 14.2 10*3/uL — ABNORMAL HIGH (ref 4.0–10.5)
nRBC: 0 % (ref 0.0–0.2)

## 2022-01-09 LAB — COMPREHENSIVE METABOLIC PANEL
ALT: 26 U/L (ref 0–44)
AST: 27 U/L (ref 15–41)
Albumin: 3.5 g/dL (ref 3.5–5.0)
Alkaline Phosphatase: 56 U/L (ref 38–126)
Anion gap: 15 (ref 5–15)
BUN: 22 mg/dL (ref 8–23)
CO2: 22 mmol/L (ref 22–32)
Calcium: 9.3 mg/dL (ref 8.9–10.3)
Chloride: 101 mmol/L (ref 98–111)
Creatinine, Ser: 1.27 mg/dL — ABNORMAL HIGH (ref 0.44–1.00)
GFR, Estimated: 44 mL/min — ABNORMAL LOW (ref >60)
Glucose, Bld: 164 mg/dL — ABNORMAL HIGH (ref 70–99)
Potassium: 3.8 mmol/L (ref 3.5–5.1)
Sodium: 138 mmol/L (ref 135–145)
Total Bilirubin: 1.1 mg/dL (ref 0.3–1.2)
Total Protein: 6.2 g/dL — ABNORMAL LOW (ref 6.5–8.1)

## 2022-01-09 LAB — LACTIC ACID, PLASMA: Lactic Acid, Venous: 2.7 mmol/L (ref 0.5–1.9)

## 2022-01-09 LAB — LIPASE, BLOOD: Lipase: 24 U/L (ref 11–51)

## 2022-01-09 SURGERY — ECHOCARDIOGRAM, TRANSESOPHAGEAL
Anesthesia: Monitor Anesthesia Care

## 2022-01-09 MED ORDER — LACTATED RINGERS IV BOLUS
1000.0000 mL | Freq: Once | INTRAVENOUS | Status: AC
  Administered 2022-01-09: 1000 mL via INTRAVENOUS

## 2022-01-09 MED ORDER — ONDANSETRON HCL 4 MG/2ML IJ SOLN
4.0000 mg | Freq: Once | INTRAMUSCULAR | Status: AC
Start: 2022-01-09 — End: 2022-01-09
  Administered 2022-01-09: 4 mg via INTRAVENOUS
  Filled 2022-01-09: qty 2

## 2022-01-09 MED ORDER — PHENYLEPHRINE 40 MCG/ML (10ML) SYRINGE FOR IV PUSH (FOR BLOOD PRESSURE SUPPORT)
PREFILLED_SYRINGE | INTRAVENOUS | Status: DC | PRN
  Administered 2022-01-09 (×3): 80 ug via INTRAVENOUS
  Administered 2022-01-09: 120 ug via INTRAVENOUS
  Administered 2022-01-09: 80 ug via INTRAVENOUS

## 2022-01-09 MED ORDER — PANTOPRAZOLE SODIUM 40 MG IV SOLR
40.0000 mg | Freq: Once | INTRAVENOUS | Status: AC
  Administered 2022-01-09: 40 mg via INTRAVENOUS
  Filled 2022-01-09: qty 10

## 2022-01-09 MED ORDER — SODIUM CHLORIDE 0.9 % IV SOLN: INTRAVENOUS | Status: DC

## 2022-01-09 MED ORDER — PROPOFOL 500 MG/50ML IV EMUL
INTRAVENOUS | Status: DC | PRN
  Administered 2022-01-09: 100 ug/kg/min via INTRAVENOUS

## 2022-01-09 MED ORDER — GLYCOPYRROLATE PF 0.2 MG/ML IJ SOSY
PREFILLED_SYRINGE | INTRAMUSCULAR | Status: DC | PRN
  Administered 2022-01-09: .2 mg via INTRAVENOUS

## 2022-01-09 MED ORDER — HYDROMORPHONE HCL 1 MG/ML IJ SOLN
0.5000 mg | Freq: Once | INTRAMUSCULAR | Status: AC
  Administered 2022-01-09: 0.5 mg via INTRAVENOUS
  Filled 2022-01-09: qty 1

## 2022-01-09 MED ORDER — IOHEXOL 350 MG/ML SOLN
100.0000 mL | Freq: Once | INTRAVENOUS | Status: AC | PRN
  Administered 2022-01-09: 100 mL via INTRAVENOUS

## 2022-01-09 MED ORDER — ONDANSETRON HCL 4 MG PO TABS
4.0000 mg | ORAL_TABLET | Freq: Four times a day (QID) | ORAL | 0 refills | Status: AC | PRN
Start: 2022-01-09 — End: ?

## 2022-01-09 MED ORDER — EPHEDRINE SULFATE-NACL 50-0.9 MG/10ML-% IV SOSY
PREFILLED_SYRINGE | INTRAVENOUS | Status: DC | PRN
Start: 2022-01-09 — End: 2022-01-09
  Administered 2022-01-09 (×2): 10 mg via INTRAVENOUS
  Administered 2022-01-09: 5 mg via INTRAVENOUS

## 2022-01-09 NOTE — ED Notes (Signed)
Pt reports no dizziness with standing

## 2022-01-09 NOTE — Anesthesia Preprocedure Evaluation (Addendum)
Anesthesia Evaluation  Patient identified by MRN, date of birth, ID band Patient awake    Reviewed: Allergy & Precautions, NPO status , Patient's Chart, lab work & pertinent test results  History of Anesthesia Complications Negative for: history of anesthetic complications  Airway Mallampati: III  TM Distance: >3 FB Neck ROM: Full    Dental  (+) Poor Dentition, Chipped, Missing   Pulmonary neg shortness of breath, neg COPD, neg recent URI, Current Smoker and Patient abstained from smoking.,    breath sounds clear to auscultation       Cardiovascular hypertension, Pt. on medications and Pt. on home beta blockers (-) angina+ dysrhythmias Atrial Fibrillation  Rhythm:Irregular  1. Left ventricular ejection fraction, by estimation, is 55 to 60%. The  left ventricle has normal function. The left ventricle has no regional  wall motion abnormalities. Left ventricular diastolic parameters are  indeterminate.  2. Right ventricular systolic function is normal. The right ventricular  size is normal. There is normal pulmonary artery systolic pressure. The  estimated right ventricular systolic pressure is 83.3 mmHg.  3. The mitral valve is normal in structure. Mild mitral valve  regurgitation. No evidence of mitral stenosis.  4. The aortic valve is tricuspid. Aortic valve regurgitation is not  visualized. No aortic stenosis is present.  5. The inferior vena cava is normal in size with greater than 50%  respiratory variability, suggesting right atrial pressure of 3 mmHg.  6. Cannot exclude a small PFO.    Neuro/Psych PSYCHIATRIC DISORDERS Anxiety Depression  Neuromuscular disease    GI/Hepatic negative GI ROS, Neg liver ROS,   Endo/Other  negative endocrine ROSLab Results      Component                Value               Date                      HGBA1C                   5.3                 12/19/2006             Renal/GU ARFRenal  diseaseLab Results      Component                Value               Date                      CREATININE               1.27 (H)            01/09/2022                Musculoskeletal  (+) Arthritis ,   Abdominal   Peds  Hematology  (+) Blood dyscrasia, , Lab Results      Component                Value               Date                      WBC                      14.2 (H)  01/09/2022                HGB                      14.9                01/09/2022                HCT                      45.7                01/09/2022                MCV                      85.1                01/09/2022                PLT                      213                 01/09/2022             eliquis   Anesthesia Other Findings   Reproductive/Obstetrics                            Anesthesia Physical Anesthesia Plan  ASA: 2  Anesthesia Plan: MAC and General   Post-op Pain Management: Minimal or no pain anticipated   Induction: Intravenous  PONV Risk Score and Plan: 2 and Propofol infusion and Treatment may vary due to age or medical condition  Airway Management Planned: Nasal Cannula  Additional Equipment: None  Intra-op Plan:   Post-operative Plan:   Informed Consent: I have reviewed the patients History and Physical, chart, labs and discussed the procedure including the risks, benefits and alternatives for the proposed anesthesia with the patient or authorized representative who has indicated his/her understanding and acceptance.     Dental advisory given  Plan Discussed with: CRNA and Anesthesiologist  Anesthesia Plan Comments:         Anesthesia Quick Evaluation

## 2022-01-09 NOTE — ED Notes (Signed)
Dr. Glick at bedside.  

## 2022-01-09 NOTE — ED Notes (Signed)
Pt pressures reading a little soft - MD notified - fluid bolus cont to infuse at this time

## 2022-01-09 NOTE — ED Notes (Signed)
Patient verbalizes understanding of discharge instructions. Opportunity for questioning and answers were provided. Armband removed by staff, pt discharged from ED via wheelchair.  

## 2022-01-09 NOTE — ED Provider Notes (Signed)
[facility] Provider Note   CSN: [id number] Arrival date & time: 01/09/22  0110     History  Chief Complaint  Patient presents with   Abdominal Pain    Kristina Dougherty is a 76 y.o. female.  The history is provided by the patient.  Abdominal Pain She has history of hypertension, atrial fibrillation and comes in because of upper abdominal pain which started last night and has continued throughout the day.  Pain has waxed and waned in severity, but has never gone away.  Maximum severity was rated at 9/10.  There is associated nausea and vomiting.  She has not had a bowel movement or passed flatus since symptoms started.  Pain is not affected by eating, but she vomits after trying to eat.  She denies fever, chills, sweats.  She has been having ongoing dyspnea and peripheral edema which are unchanged.  She states she has been compliant with her apixaban and metoprolol.   Home Medications Prior to Admission medications   Medication Sig Start Date End Date Taking? Authorizing Provider  acetaminophen (TYLENOL) 500 MG tablet Take 500 mg by mouth every 6 (six) hours as needed (pain.).    [provider]  ALPRAZolam Duanne Moron) 0.5 MG tablet Take 1 tablet (0.5 mg total) by mouth 3 (three) times daily as needed for anxiety. TAKE 1 TABLET BY MOUTH THREE TIMES DAILY AS NEEDED FOR ANXIETY OR SLEEP Patient taking differently: Take 0.25-0.5 mg by mouth 3 (three) times daily as needed for anxiety or sleep. TAKE 1 TABLET BY MOUTH THREE TIMES DAILY AS NEEDED FOR ANXIETY OR SLEEP 12/16/21   Susy Frizzle, MD  amLODipine (NORVASC) 10 MG tablet Take 10 mg by mouth in the morning.    [provider]  apixaban (ELIQUIS) 5 MG TABS tablet Take 1 tablet (5 mg total) by mouth 2 (two) times daily. 12/29/21 03/29/22  Sherran Needs, NP  clobetasol cream (TEMOVATE) 2.99 % APPLY ONE APPLICATION TOPICALLY TWO TIMES DAILY Patient taking differently: Apply 1  application topically See admin instructions. APPLY ONE APPLICATION TOPICALLY TWO TIMES DAILY prn 03/03/19   Susy Frizzle, MD  furosemide (LASIX) 40 MG tablet Take 1 tablet (40 mg total) by mouth daily. 12/29/21   Sherran Needs, NP  hydrochlorothiazide (MICROZIDE) 12.5 MG capsule Take 12.5 mg by mouth daily.    [provider]  losartan (COZAAR) 50 MG tablet Take 50 mg by mouth daily.    [provider]  melatonin 1 MG TABS tablet Take 1 mg by mouth at bedtime as needed (sleep).    [provider]  meloxicam (MOBIC) 15 MG tablet Take 15 mg by mouth daily as needed for pain.    [provider]  metoprolol tartrate (LOPRESSOR) 50 MG tablet Take 1 tablet by mouth 2  times daily. 12/29/21 03/29/22  Sherran Needs, NP  Multiple Vitamin (MULITIVITAMIN WITH MINERALS) TABS Take 1 tablet by mouth daily.    [provider]  pantoprazole (PROTONIX) 40 MG tablet TAKE 1 TABLET BY MOUTH  DAILY Patient taking differently: 40 mg daily before breakfast. 08/08/21   Susy Frizzle, MD  PARoxetine (PAXIL) 20 MG tablet TAKE 1 TABLET BY MOUTH  TWICE DAILY 08/08/21   Susy Frizzle, MD  potassium chloride SA (KLOR-CON M) 20 MEQ tablet Take 1 tablet (20 mEq total) by mouth daily. 12/29/21   Sherran Needs, NP      Allergies    Morphine and  related and Nitrofurantoin    Review of Systems   Review of Systems  Gastrointestinal:  Positive for abdominal pain.  All other systems reviewed and are negative.  Physical Exam Updated Vital Signs BP 126/85    Pulse (!) 141    Temp 97.6 F (36.4 C) (Oral)    Resp (!) 22    SpO2 93%  Physical Exam Vitals and nursing note reviewed.  76 year old female, resting comfortably and in no acute distress. Vital signs are significant for elevated respiratory rate, blood pressure, heart rate. Oxygen saturation is 93%, which is normal. Head is normocephalic and atraumatic. PERRLA, EOMI. Oropharynx is clear. Neck is nontender and supple  without adenopathy or JVD. Back is nontender and there is no CVA tenderness.  There is trace presacral edema. Lungs have diminished airflow with bibasilar rales.  There are no wheezes or rhonchi. Chest is nontender. Heart is tachycardic and irregular without murmur. Abdomen is soft, flat, with mild to moderate upper abdominal tenderness which is poorly localized.  There is no rebound or guarding.  Peristalsis is hypoactive. Extremities have 2+ edema, full range of motion is present. Skin is warm and dry without rash. Neurologic: Mental status is normal, cranial nerves are intact, there are no motor or sensory deficits.  ED Results / Procedures / Treatments   Labs (all labs ordered are listed, but only abnormal results are displayed) Labs Reviewed  COMPREHENSIVE METABOLIC PANEL - Abnormal; Notable for the following components:      Result Value   Glucose, Bld 164 (*)    Creatinine, Ser 1.27 (*)    Total Protein 6.2 (*)    GFR, Estimated 44 (*)    All other components within normal limits  CBC WITH DIFFERENTIAL/PLATELET - Abnormal; Notable for the following components:   WBC 14.2 (*)    RBC 5.37 (*)    Neutro Abs 11.9 (*)    All other components within normal limits  LACTIC ACID, PLASMA - Abnormal; Notable for the following components:   Lactic Acid, Venous 2.7 (*)    All other components within normal limits  LIPASE, BLOOD    EKG EKG Interpretation  Date/Time:  Monday January 09 2022 01:18:04 EST Ventricular Rate:  141 PR Interval:    QRS Duration: 70 QT Interval:  292 QTC Calculation: 447 R Axis:   112 Text Interpretation: Atrial fibrillation with rapid ventricular response Right axis deviation Low voltage QRS Cannot rule out Anterior infarct , age undetermined Abnormal ECG When compared with ECG of 03-Jan-2022 10:57, No significant change was found Confirmed by Delora Fuel ([id number]) on 01/09/2022 6:30:55 AM  Radiology CT Angio Chest/Abd/Pel for Dissection W and/or Wo  Contrast  Result Date: 01/09/2022 CLINICAL DATA:  Chest pain or back pain, aortic dissection suspected history of a-fib, concern for embolization to abdomen. Abdominal pain, nausea, vomiting EXAM: CT ANGIOGRAPHY CHEST, ABDOMEN AND PELVIS TECHNIQUE: Non-contrast CT of the chest was initially obtained. Multidetector CT imaging through the chest, abdomen and pelvis was performed using the standard protocol during bolus administration of intravenous contrast. Multiplanar reconstructed images and MIPs were obtained and reviewed to evaluate the vascular anatomy. RADIATION DOSE REDUCTION: This exam was performed according to the departmental dose-optimization program which includes automated exposure control, adjustment of the mA and/or kV according to patient size and/or use of iterative reconstruction technique. CONTRAST:  118mL OMNIPAQUE IOHEXOL 350 MG/ML SOLN COMPARISON:  None. FINDINGS: CTA CHEST FINDINGS Cardiovascular: Heart is mildly enlarged. Aorta normal caliber. No dissection. Scattered aortic  calcifications. Mediastinum/Nodes: No mediastinal, hilar, or axillary adenopathy. Trachea and esophagus are unremarkable. Lungs/Pleura: Small to moderate bilateral pleural effusions. Compressive atelectasis in the lower lobes. Musculoskeletal: Chest wall soft tissues are unremarkable. No acute bony abnormality. Review of the MIP images confirms the above findings. CTA ABDOMEN AND PELVIS FINDINGS VASCULAR Aorta: Normal caliber aorta without aneurysm, dissection, vasculitis or significant stenosis. Scattered aortic atherosclerosis. Celiac: Patent without evidence of aneurysm, dissection, vasculitis or significant stenosis. SMA: Patent without evidence of aneurysm, dissection, vasculitis or significant stenosis. Renals: Both renal arteries are patent without evidence of aneurysm, dissection, vasculitis, fibromuscular dysplasia or significant stenosis. IMA: Patent without evidence of aneurysm, dissection, vasculitis or  significant stenosis. Inflow: Patent without evidence of aneurysm, dissection, vasculitis or significant stenosis. Veins: No obvious venous abnormality within the limitations of this arterial phase study. Review of the MIP images confirms the above findings. NON-VASCULAR Hepatobiliary: The scattered hypodensities in the liver, likely cysts, the largest centrally in the right hepatic lobe measuring 2.3 cm. Gallbladder unremarkable. Pancreas: No focal abnormality or ductal dilatation. Spleen: No focal abnormality.  Normal size. Adrenals/Urinary Tract: Adrenal glands and kidneys unremarkable. No hydronephrosis. Urinary bladder unremarkable. Stomach/Bowel: Stomach, large and small bowel grossly unremarkable. Lymphatic: No adenopathy Reproductive: Prior hysterectomy.  No adnexal masses. Other: Small amount of free fluid in the pelvis.  No free air. Musculoskeletal: No acute bony abnormality. Review of the MIP images confirms the above findings. IMPRESSION: No evidence of aortic aneurysm or dissection. Small to moderate bilateral pleural effusions with compressive atelectasis in the lower lobes. Trace free fluid in the pelvis. Otherwise no acute findings in the abdomen or pelvis. Aortic atherosclerosis. Electronically Signed   By: Rolm Baptise M.D.   On: 01/09/2022 03:08    Procedures Procedures  Cardiac monitor, per my interpretation, shows atrial fibrillation with rapid ventricular response.  Medications Ordered in ED Medications  ondansetron (ZOFRAN) injection 4 mg (has no administration in time range)  HYDROmorphone (DILAUDID) injection 0.5 mg (has no administration in time range)    ED Course/ Medical Decision Making/ A&P                           Medical Decision Making Amount and/or Complexity of Data Reviewed Labs: ordered. Radiology: ordered.  Risk Prescription drug management.   Upper abdominal pain in the setting of poorly controlled atrial fibrillation.  Old records are reviewed  confirming known history of atrial fibrillation and recent episode where she stopped taking apixaban because of dyspnea.  This does raise possibility of embolic disease to the abdominal vasculature.  Also consider possibility of pancreatitis, cholecystitis, diverticulitis.  She will be given IV hydromorphone and ondansetron to treat pain and nausea, she will be sent for CT angiogram of the chest and abdomen and pelvis to look for evidence of arterial embolism as well as intra-abdominal pathology as noted above in differential diagnosis.  ECG shows atrial fibrillation with rapid ventricular response, unchanged from prior.  She got good relief of symptoms with above-noted treatment.  CT angiogram shows no acute process, specifically, no vascular occlusion.  Labs did show an elevation of lactic acid which is felt to be secondary to her rapid heart rate and not due to sepsis.  No evidence of any active ischemia seen on CT scan.  She was given a liter of IV fluid and was resting comfortably.  She is scheduled for TEE today, she is discharged with instructions to keep that appointment, return to the  emergency department if symptoms or not being adequately controlled at home.        Final Clinical Impression(s) / ED Diagnoses Final diagnoses:  Abdominal pain, unspecified abdominal location  Persistent atrial fibrillation (HCC)  Elevated lactic acid level  Chronic anticoagulation    Rx / DC Orders ED Discharge Orders          Ordered    ondansetron (ZOFRAN) 4 MG tablet  Every 6 hours PRN        01/09/22 2376              Delora Fuel, MD 28/[birth year]/51 [phone]

## 2022-01-09 NOTE — ED Notes (Signed)
Pt sats in the mid 80s - Pt placed on 3L [place] at this time

## 2022-01-09 NOTE — Anesthesia Postprocedure Evaluation (Signed)
Anesthesia Post Note  Patient: Kristina Dougherty  Procedure(s) Performed: TRANSESOPHAGEAL ECHOCARDIOGRAM (TEE) CARDIOVERSION BUBBLE STUDY     Patient location during evaluation: Endoscopy Anesthesia Type: General Level of consciousness: awake and alert Pain management: pain level controlled Vital Signs Assessment: post-procedure vital signs reviewed and stable Respiratory status: spontaneous breathing, nonlabored ventilation, respiratory function stable and patient connected to nasal cannula oxygen Cardiovascular status: blood pressure returned to baseline and stable Postop Assessment: no apparent nausea or vomiting Anesthetic complications: no   No notable events documented.  Last Vitals:  Vitals:   01/09/22 0937 01/09/22 0950  BP: (!) 97/59 112/86  Pulse: (!) 57 (!) 55  Resp: 19 16  Temp:    SpO2: 92% 91%    Last Pain:  Vitals:   01/09/22 0937  TempSrc:   PainSc: 0-No pain                 [name]

## 2022-01-09 NOTE — Transfer of Care (Signed)
Immediate Anesthesia Transfer of Care Note  Patient: Kristina Dougherty  Procedure(s) Performed: TRANSESOPHAGEAL ECHOCARDIOGRAM (TEE) CARDIOVERSION BUBBLE STUDY  Patient Location: PACU and Endoscopy Unit  Anesthesia Type:MAC  Level of Consciousness: drowsy  Airway & Oxygen Therapy: Patient Spontanous Breathing and Patient connected to nasal cannula oxygen  Post-op Assessment: Report given to RN and Post -op Vital signs reviewed and stable  Post vital signs: Reviewed and stable  Last Vitals:  Vitals Value Taken Time  BP 84/56 01/09/22 0923  Temp    Pulse 51 01/09/22 0924  Resp 12 01/09/22 0924  SpO2 93 % 01/09/22 0924  Vitals shown include unvalidated device data.  Last Pain:  Vitals:   01/09/22 0918  TempSrc:   PainSc: Asleep         Complications: No notable events documented.

## 2022-01-09 NOTE — Interval H&P Note (Signed)
History and Physical Interval Note:  01/09/2022 8:33 AM  Kristina Dougherty  has presented today for surgery, with the diagnosis of new onset afib.  The various methods of treatment have been discussed with the patient and family. After consideration of risks, benefits and other options for treatment, the patient has consented to  Procedure(s): TRANSESOPHAGEAL ECHOCARDIOGRAM (TEE) (N/A) CARDIOVERSION (N/A) as a surgical intervention.  The patient's history has been reviewed, patient examined, no change in status, stable for surgery.  I have reviewed the patient's chart and labs.  Questions were answered to the patient's satisfaction.     [name], MD

## 2022-01-09 NOTE — ED Notes (Signed)
Pt with HR 160s, hx afib, states she is supposed to see her doctor tomorrow for same.

## 2022-01-09 NOTE — ED Notes (Signed)
Pt taken to CT with this RN.

## 2022-01-09 NOTE — Discharge Instructions (Signed)

## 2022-01-09 NOTE — ED Notes (Signed)
MD notified of critical lactic  

## 2022-01-09 NOTE — CV Procedure (Signed)
Brief TEE Note  LVEF 60-65% No LA/LAA thrombus or masses Mild MR, mild TR +PFO with right to left shunting at rest.  For additional details see full report.  Electrical Cardioversion Procedure Note Kristina Dougherty [id number] 1945/12/07  Procedure: Electrical Cardioversion Indications:  Atrial Fibrillation  Procedure Details Consent: Risks of procedure as well as the alternatives and risks of each were explained to the (patient/caregiver).  Consent for procedure obtained. Time Out: Verified patient identification, verified procedure, site/side was marked, verified correct patient position, special equipment/implants available, medications/allergies/relevent history reviewed, required imaging and test results available.  Performed  Patient placed on cardiac monitor, pulse oximetry, supplemental oxygen as necessary.  Sedation given:  propofol Pacer pads placed anterior and posterior chest.  Cardioverted 1 time(s).  Cardioverted at 150J.  Evaluation Findings: Post procedure EKG shows:  sinus bradycardia with PACs Complications: None Patient did tolerate procedure well.   Kristina Latch, MD 01/09/2022, 9:15 AM

## 2022-01-09 NOTE — ED Triage Notes (Signed)
Pt c/o abdominal pain, nausea and vomiting x 1 day. Denies diarrhea.

## 2022-01-09 NOTE — Discharge Instructions (Addendum)
Go to your TEE study today, as scheduled.  Return if symptoms are not being adequately controlled at home.

## 2022-01-11 ENCOUNTER — Telehealth (HOSPITAL_COMMUNITY): Payer: Self-pay | Admitting: *Deleted

## 2022-01-11 ENCOUNTER — Encounter (HOSPITAL_COMMUNITY): Payer: Self-pay | Admitting: Cardiovascular Disease

## 2022-01-11 MED ORDER — AMIODARONE HCL 200 MG PO TABS: ORAL_TABLET | ORAL | 0 refills | Status: DC

## 2022-01-11 NOTE — Telephone Encounter (Signed)
Patient called in stating her HR has been elevated since early this morning. Unable to "count it" and does not know her BP. Discussed with Roderic Palau NP will start Amiodarone 200mg  BID with follow up tomorrow afternoon. Patient verbalized understanding.

## 2022-01-12 ENCOUNTER — Other Ambulatory Visit: Payer: Self-pay

## 2022-01-12 ENCOUNTER — Encounter (HOSPITAL_COMMUNITY): Payer: Self-pay | Admitting: Nurse Practitioner

## 2022-01-12 ENCOUNTER — Ambulatory Visit (HOSPITAL_COMMUNITY)
Admission: RE | Admit: 2022-01-12 | Discharge: 2022-01-12 | Disposition: A | Discharge status: Home / Self Care | Payer: Medicare Other | Source: Ambulatory Visit | Attending: Nurse Practitioner | Admitting: Nurse Practitioner

## 2022-01-12 VITALS — BP 94/68 | HR 118 | Ht 62.0 in | Wt 188.2 lb

## 2022-01-12 DIAGNOSIS — Z7901 Long term (current) use of anticoagulants: Secondary | ICD-10-CM | POA: Insufficient documentation

## 2022-01-12 DIAGNOSIS — I4819 Other persistent atrial fibrillation: Secondary | ICD-10-CM | POA: Diagnosis not present

## 2022-01-12 DIAGNOSIS — F419 Anxiety disorder, unspecified: Secondary | ICD-10-CM | POA: Insufficient documentation

## 2022-01-12 DIAGNOSIS — F32A Depression, unspecified: Secondary | ICD-10-CM | POA: Diagnosis not present

## 2022-01-12 DIAGNOSIS — Z79899 Other long term (current) drug therapy: Secondary | ICD-10-CM | POA: Insufficient documentation

## 2022-01-12 DIAGNOSIS — I10 Essential (primary) hypertension: Secondary | ICD-10-CM | POA: Diagnosis not present

## 2022-01-12 DIAGNOSIS — D6869 Other thrombophilia: Secondary | ICD-10-CM | POA: Diagnosis not present

## 2022-01-12 DIAGNOSIS — R109 Unspecified abdominal pain: Secondary | ICD-10-CM | POA: Diagnosis not present

## 2022-01-12 DIAGNOSIS — I4891 Unspecified atrial fibrillation: Secondary | ICD-10-CM | POA: Diagnosis not present

## 2022-01-12 DIAGNOSIS — K5900 Constipation, unspecified: Secondary | ICD-10-CM | POA: Insufficient documentation

## 2022-01-12 NOTE — Progress Notes (Signed)
Primary Care Physician: Susy Frizzle, MD Referring Physician: Hospital F/u    Kristina Dougherty is a 76 y.o. female with a h/o HTN, anxiety/depression, that was admitted 12/04/21 to 12/06/21 at [facility] with new onset afib with RVR. She was rate controlled with IV Cardizem and then switched to metoprolol 50 mg bid.  She was also started on eliquis 5 mg bid for a CHA2DS2VASc  score of 4.   In the clinic on f/u, her EKG showed afib with RVR at 162 bpm. She had gained 10  lbs of fluid. She is having shortness of breath  and she stopped both eliquis and metoprolol as she felt the drugs were making her worse. She feels very full thru her abdomen which effects her appetite and has ++ pedal edema, does not describe PND/orthopnea. Her HCTZ was stopped in the hospital. She is here with her son today. She had a fall last week but was not on anticoagulation at the time.    F/u in the afib clinic, 01/03/22. She now has had on her scales at home, in her PJ's, around a 10 lb weight loss, since lasix was started last visit  here with clothes, 6 lbs. Since back on metoprolol, she is now running 117-130 bpm in afib  vrs 160bpm last week when I saw her not on metoprolol. She has been taking her eliquis on  a regular basis since last week. She feels improved but still with a lot of fatigue and shortness of breath. Still no PND/orthopnea. We discussed that she has a soft BP limiting up titration o BB. Since she has not been on anticoagulation x 3 weeks, and I am concerned she may have deterioration in her status,   will go ahead and schedule TEE/cardioversion for next Monday, which is next available. She and her son are in agreement. Still remind them to have a low threshold to go to ER if symptoms worsen.   F/u in afib clinic, 2/16 as she called to office yesterday as she had noted increased pulse rates. I went ahead and started her on amiodarone 200 mg bid as I feared ERAF. Ekg today shows afib at 118 bpm. She actually went  to the ER the night prior to the cardioversion for abdominal pain, n/v. No definite dx. for pain found. She was given a shot for pain slept for several hours and was then rolled around to the endo unit for cardioversion. She felt  better after successful cardioversion and her abdominal  pain has been improved.     Today, she denies symptoms of palpitations, chest pain, shortness of breath, orthopnea, PND, lower extremity edema, dizziness, presyncope, syncope, or neurologic sequela. The patient is tolerating medications without difficulties and is otherwise without complaint today.   Past Medical History:  Diagnosis Date   Anxiety    Arthritis    Atrial fibrillation ([place])    Deaf, right    Depression    Hypertension    Osteopenia    Past Surgical History:  Procedure Laterality Date   ABDOMINAL HYSTERECTOMY     APPENDECTOMY     BUBBLE STUDY  01/09/2022   Procedure: BUBBLE STUDY;  Surgeon: [name], MD;  Location: [place];  Service: Cardiovascular;;   CARDIOVERSION N/A 01/09/2022   Procedure: CARDIOVERSION;  Surgeon: [name], MD;  Location: [place];  Service: Cardiovascular;  Laterality: N/A;   TEE WITHOUT CARDIOVERSION N/A 01/09/2022   Procedure: TRANSESOPHAGEAL ECHOCARDIOGRAM (TEE);  Surgeon: [name], MD;  Location: [facility];  Service: Cardiovascular;  Laterality: N/A;    Current Outpatient Medications  Medication Sig Dispense Refill   acetaminophen (TYLENOL) 500 MG tablet Take 500 mg by mouth every 6 (six) hours as needed (pain.).     ALPRAZolam (XANAX) 0.5 MG tablet Take 1 tablet (0.5 mg total) by mouth 3 (three) times daily as needed for anxiety. TAKE 1 TABLET BY MOUTH THREE TIMES DAILY AS NEEDED FOR ANXIETY OR SLEEP (Patient taking differently: Take 0.25-0.5 mg by mouth 3 (three) times daily as needed for anxiety or sleep. TAKE 1 TABLET BY MOUTH THREE TIMES DAILY AS NEEDED FOR ANXIETY OR SLEEP) 30 tablet 0   amiodarone (PACERONE) 200 MG tablet  Take 1 tablet by mouth twice a day for one month then reduce to 1 tablet daily 60 tablet 0   amLODipine (NORVASC) 10 MG tablet Take 10 mg by mouth in the morning.     apixaban (ELIQUIS) 5 MG TABS tablet Take 1 tablet (5 mg total) by mouth 2 (two) times daily. 60 tablet 2   clobetasol cream (TEMOVATE) 3.32 % APPLY ONE APPLICATION TOPICALLY TWO TIMES DAILY (Patient taking differently: Apply 1 application topically See admin instructions. APPLY ONE APPLICATION TOPICALLY TWO TIMES DAILY prn) 30 g 3   furosemide (LASIX) 40 MG tablet Take 1 tablet (40 mg total) by mouth daily. 30 tablet 2   hydrochlorothiazide (MICROZIDE) 12.5 MG capsule Take 12.5 mg by mouth daily.     losartan (COZAAR) 50 MG tablet Take 50 mg by mouth daily.     meloxicam (MOBIC) 15 MG tablet Take 15 mg by mouth daily as needed for pain.     metoprolol tartrate (LOPRESSOR) 50 MG tablet Take 1 tablet by mouth 2  times daily. 60 tablet 2   ondansetron (ZOFRAN) 4 MG tablet Take 1 tablet (4 mg total) by mouth every 6 (six) hours as needed for nausea. 20 tablet 0   PARoxetine (PAXIL) 20 MG tablet TAKE 1 TABLET BY MOUTH  TWICE DAILY 180 tablet 3   potassium chloride SA (KLOR-CON M) 20 MEQ tablet Take 1 tablet (20 mEq total) by mouth daily. 30 tablet 2   melatonin 1 MG TABS tablet Take 1 mg by mouth at bedtime as needed (sleep). (Patient not taking: Reported on 01/12/2022)     Multiple Vitamin (MULITIVITAMIN WITH MINERALS) TABS Take 1 tablet by mouth daily. (Patient not taking: Reported on 01/12/2022)     pantoprazole (PROTONIX) 40 MG tablet TAKE 1 TABLET BY MOUTH  DAILY (Patient not taking: Reported on 01/12/2022) 90 tablet 3   No current facility-administered medications for this encounter.    Allergies  Allergen Reactions   Morphine And Related     Severe HA per pt   Nitrofurantoin Nausea And Vomiting    Social History   Socioeconomic History   Marital status: Divorced    Spouse name: Not on file   Number of children: Not on file    Years of education: Not on file   Highest education level: Not on file  Occupational History   Not on file  Tobacco Use   Smoking status: Every Day    Packs/day: 0.30    Types: Cigarettes    Last attempt to quit: 08/28/2011    Years since quitting: 10.3   Smokeless tobacco: Never  Substance and Sexual Activity   Alcohol use: No   Drug use: No   Sexual activity: Never  Other Topics Concern   Not on file  Social  History Narrative   Not on file   Social Determinants of Health   Financial Resource Strain: Not on file  Food Insecurity: Not on file  Transportation Needs: Not on file  Physical Activity: Not on file  Stress: Not on file  Social Connections: Not on file  Intimate Partner Violence: Not on file    Family History  Problem Relation Age of Onset   Hypertension Maternal Grandmother     ROS- All systems are reviewed and negative except as per the HPI above  Physical Exam: Vitals:   01/12/22 1414  BP: 94/68  Pulse: (!) 118  Weight: 85.4 kg  Height: 5\' 2"  (1.575 m)   Wt Readings from Last 3 Encounters:  01/12/22 85.4 kg  01/09/22 81.6 kg  01/03/22 83.6 kg    Labs: Lab Results  Component Value Date   NA 138 01/09/2022   K 3.8 01/09/2022   CL 101 01/09/2022   CO2 22 01/09/2022   GLUCOSE 164 (H) 01/09/2022   BUN 22 01/09/2022   CREATININE 1.27 (H) 01/09/2022   CALCIUM 9.3 01/09/2022   MG 2.3 12/06/2021   No results found for: INR Lab Results  Component Value Date   CHOL 237 (H) 11/22/2016   HDL 71 11/22/2016   LDLCALC 148 (H) 11/22/2016   TRIG 90 11/22/2016     GEN- The patient is well appearing, alert and oriented x 3 today.   Head- normocephalic, atraumatic Eyes-  Sclera clear, conjunctiva pink Ears- hearing intact Oropharynx- clear Neck- supple, no JVP Lymph- no cervical lymphadenopathy Lungs- Clear to ausculation bilaterally, normal work of breathing Heart- irregular rate and rhythm, no murmurs, rubs or gallops, PMI not laterally  displaced GI- distended,  + BS Extremities- no clubbing, cyanosis, or ++ bilateral  edema MS- no significant deformity or atrophy Skin- no rash or lesion Psych- euthymic mood, full affect Neuro- strength and sensation are intact  EKG-afib at 118  qrs int 66 ms, qtc 400 ms.   Echo-1. Left ventricular ejection fraction, by estimation, is 55 to 60%. The  left ventricle has normal function. The left ventricle has no regional  wall motion abnormalities. Left ventricular diastolic parameters are  indeterminate.   2. Right ventricular systolic function is normal. The right ventricular  size is normal. There is normal pulmonary artery systolic pressure. The  estimated right ventricular systolic pressure is 62.8 mmHg.   3. The mitral valve is normal in structure. Mild mitral valve  regurgitation. No evidence of mitral stenosis.   4. The aortic valve is tricuspid. Aortic valve regurgitation is not  visualized. No aortic stenosis is present.   5. The inferior vena cava is normal in size with greater than 50%  respiratory variability, suggesting right atrial pressure of 3 mmHg.   6. Cannot exclude a small PFO.    Assessment and Plan:  1. New onset afib with RVR  Successful cardioversion but with ERAF Started on amiodarone 200 mg bid   I will plan for cardioversion after loading x one month if amio does not restore SR  She will return next Friday for EKG and I will see back in office 2 weeks after that Continue metoprolol She resumed hctz and amlodipine after cardioversion She will  stop HCTZ as she is on lasix and hold amlodipine for now with a soft BP of 94/68 Continue  daily weights  2. CHA2DS2VASc  score of at least 4 Continue eliquis 5 mg bid   3. Intermittent abdominal pain/constipation  F/u  with PCP   Return one week for EKG and I will see back in office 2 weeks after that   [place]. Maudene Stotler, [facility] [address] [place], [place]  [id number] [phone]

## 2022-01-17 ENCOUNTER — Encounter: Payer: Self-pay | Admitting: Family Medicine

## 2022-01-17 ENCOUNTER — Ambulatory Visit (INDEPENDENT_AMBULATORY_CARE_PROVIDER_SITE_OTHER): Payer: Medicare Other | Admitting: Family Medicine

## 2022-01-17 ENCOUNTER — Encounter (HOSPITAL_COMMUNITY): Payer: Self-pay

## 2022-01-17 ENCOUNTER — Emergency Department (HOSPITAL_COMMUNITY): Payer: Medicare Other

## 2022-01-17 ENCOUNTER — Ambulatory Visit (HOSPITAL_COMMUNITY): Payer: Medicare Other | Admitting: Nurse Practitioner

## 2022-01-17 ENCOUNTER — Other Ambulatory Visit: Payer: Self-pay

## 2022-01-17 ENCOUNTER — Inpatient Hospital Stay (HOSPITAL_COMMUNITY)
Admission: EM | Admit: 2022-01-17 | Discharge: 2022-01-22 | DRG: 308 | Disposition: A | Discharge status: Home / Self Care | Payer: Medicare Other | Source: Ambulatory Visit | Attending: Internal Medicine | Admitting: Internal Medicine

## 2022-01-17 VITALS — BP 124/98 | HR 86 | Temp 97.9°F | Resp 18 | Ht 62.0 in | Wt 187.0 lb

## 2022-01-17 DIAGNOSIS — I4819 Other persistent atrial fibrillation: Secondary | ICD-10-CM

## 2022-01-17 DIAGNOSIS — Z8249 Family history of ischemic heart disease and other diseases of the circulatory system: Secondary | ICD-10-CM

## 2022-01-17 DIAGNOSIS — I110 Hypertensive heart disease with heart failure: Secondary | ICD-10-CM | POA: Diagnosis not present

## 2022-01-17 DIAGNOSIS — K5900 Constipation, unspecified: Secondary | ICD-10-CM | POA: Diagnosis present

## 2022-01-17 DIAGNOSIS — M8580 Other specified disorders of bone density and structure, unspecified site: Secondary | ICD-10-CM | POA: Diagnosis not present

## 2022-01-17 DIAGNOSIS — I10 Essential (primary) hypertension: Secondary | ICD-10-CM | POA: Diagnosis not present

## 2022-01-17 DIAGNOSIS — F341 Dysthymic disorder: Secondary | ICD-10-CM | POA: Diagnosis present

## 2022-01-17 DIAGNOSIS — F419 Anxiety disorder, unspecified: Secondary | ICD-10-CM | POA: Diagnosis present

## 2022-01-17 DIAGNOSIS — R0602 Shortness of breath: Secondary | ICD-10-CM | POA: Diagnosis not present

## 2022-01-17 DIAGNOSIS — I517 Cardiomegaly: Secondary | ICD-10-CM | POA: Diagnosis not present

## 2022-01-17 DIAGNOSIS — E876 Hypokalemia: Secondary | ICD-10-CM | POA: Diagnosis not present

## 2022-01-17 DIAGNOSIS — Z90710 Acquired absence of both cervix and uterus: Secondary | ICD-10-CM

## 2022-01-17 DIAGNOSIS — D696 Thrombocytopenia, unspecified: Secondary | ICD-10-CM | POA: Diagnosis not present

## 2022-01-17 DIAGNOSIS — Z20822 Contact with and (suspected) exposure to covid-19: Secondary | ICD-10-CM | POA: Diagnosis not present

## 2022-01-17 DIAGNOSIS — Z6833 Body mass index (BMI) 33.0-33.9, adult: Secondary | ICD-10-CM | POA: Diagnosis not present

## 2022-01-17 DIAGNOSIS — I959 Hypotension, unspecified: Secondary | ICD-10-CM | POA: Diagnosis not present

## 2022-01-17 DIAGNOSIS — E038 Other specified hypothyroidism: Secondary | ICD-10-CM | POA: Diagnosis not present

## 2022-01-17 DIAGNOSIS — Z885 Allergy status to narcotic agent status: Secondary | ICD-10-CM

## 2022-01-17 DIAGNOSIS — Q2112 Patent foramen ovale: Secondary | ICD-10-CM | POA: Diagnosis not present

## 2022-01-17 DIAGNOSIS — I4891 Unspecified atrial fibrillation: Principal | ICD-10-CM

## 2022-01-17 DIAGNOSIS — F32A Depression, unspecified: Secondary | ICD-10-CM | POA: Diagnosis not present

## 2022-01-17 DIAGNOSIS — J9811 Atelectasis: Secondary | ICD-10-CM | POA: Diagnosis not present

## 2022-01-17 DIAGNOSIS — J810 Acute pulmonary edema: Secondary | ICD-10-CM

## 2022-01-17 DIAGNOSIS — I3139 Other pericardial effusion (noninflammatory): Secondary | ICD-10-CM | POA: Diagnosis not present

## 2022-01-17 DIAGNOSIS — K2100 Gastro-esophageal reflux disease with esophagitis, without bleeding: Secondary | ICD-10-CM | POA: Diagnosis not present

## 2022-01-17 DIAGNOSIS — J90 Pleural effusion, not elsewhere classified: Secondary | ICD-10-CM

## 2022-01-17 DIAGNOSIS — R946 Abnormal results of thyroid function studies: Secondary | ICD-10-CM | POA: Diagnosis present

## 2022-01-17 DIAGNOSIS — K5909 Other constipation: Secondary | ICD-10-CM | POA: Diagnosis not present

## 2022-01-17 DIAGNOSIS — I5021 Acute systolic (congestive) heart failure: Secondary | ICD-10-CM | POA: Diagnosis present

## 2022-01-17 DIAGNOSIS — E669 Obesity, unspecified: Secondary | ICD-10-CM | POA: Diagnosis not present

## 2022-01-17 DIAGNOSIS — Z87891 Personal history of nicotine dependence: Secondary | ICD-10-CM

## 2022-01-17 DIAGNOSIS — R079 Chest pain, unspecified: Secondary | ICD-10-CM | POA: Diagnosis not present

## 2022-01-17 DIAGNOSIS — K219 Gastro-esophageal reflux disease without esophagitis: Secondary | ICD-10-CM | POA: Diagnosis not present

## 2022-01-17 DIAGNOSIS — R002 Palpitations: Secondary | ICD-10-CM | POA: Diagnosis not present

## 2022-01-17 DIAGNOSIS — M1990 Unspecified osteoarthritis, unspecified site: Secondary | ICD-10-CM | POA: Diagnosis not present

## 2022-01-17 DIAGNOSIS — N179 Acute kidney failure, unspecified: Secondary | ICD-10-CM | POA: Diagnosis not present

## 2022-01-17 DIAGNOSIS — I480 Paroxysmal atrial fibrillation: Secondary | ICD-10-CM | POA: Diagnosis not present

## 2022-01-17 DIAGNOSIS — I509 Heart failure, unspecified: Secondary | ICD-10-CM | POA: Diagnosis not present

## 2022-01-17 DIAGNOSIS — Z7901 Long term (current) use of anticoagulants: Secondary | ICD-10-CM | POA: Diagnosis not present

## 2022-01-17 DIAGNOSIS — F418 Other specified anxiety disorders: Secondary | ICD-10-CM | POA: Diagnosis not present

## 2022-01-17 DIAGNOSIS — Z79899 Other long term (current) drug therapy: Secondary | ICD-10-CM | POA: Diagnosis not present

## 2022-01-17 DIAGNOSIS — Z888 Allergy status to other drugs, medicaments and biological substances status: Secondary | ICD-10-CM

## 2022-01-17 DIAGNOSIS — I081 Rheumatic disorders of both mitral and tricuspid valves: Secondary | ICD-10-CM | POA: Diagnosis not present

## 2022-01-17 DIAGNOSIS — M7989 Other specified soft tissue disorders: Secondary | ICD-10-CM | POA: Diagnosis not present

## 2022-01-17 DIAGNOSIS — I340 Nonrheumatic mitral (valve) insufficiency: Secondary | ICD-10-CM | POA: Diagnosis not present

## 2022-01-17 DIAGNOSIS — H9191 Unspecified hearing loss, right ear: Secondary | ICD-10-CM | POA: Diagnosis present

## 2022-01-17 DIAGNOSIS — Z791 Long term (current) use of non-steroidal anti-inflammatories (NSAID): Secondary | ICD-10-CM

## 2022-01-17 DIAGNOSIS — E8770 Fluid overload, unspecified: Secondary | ICD-10-CM | POA: Diagnosis present

## 2022-01-17 LAB — MAGNESIUM: Magnesium: 1.6 mg/dL — ABNORMAL LOW (ref 1.7–2.4)

## 2022-01-17 LAB — CBC
HCT: 47.1 % — ABNORMAL HIGH (ref 36.0–46.0)
Hemoglobin: 15 g/dL (ref 12.0–15.0)
MCH: 27.2 pg (ref 26.0–34.0)
MCHC: 31.8 g/dL (ref 30.0–36.0)
MCV: 85.5 fL (ref 80.0–100.0)
Platelets: 242 10*3/uL (ref 150–400)
RBC: 5.51 MIL/uL — ABNORMAL HIGH (ref 3.87–5.11)
RDW: 13.3 % (ref 11.5–15.5)
WBC: 9.5 10*3/uL (ref 4.0–10.5)
nRBC: 0 % (ref 0.0–0.2)

## 2022-01-17 LAB — BRAIN NATRIURETIC PEPTIDE: B Natriuretic Peptide: 663.7 pg/mL — ABNORMAL HIGH (ref 0.0–100.0)

## 2022-01-17 LAB — BASIC METABOLIC PANEL
Anion gap: 14 (ref 5–15)
BUN: 16 mg/dL (ref 8–23)
CO2: 24 mmol/L (ref 22–32)
Calcium: 9 mg/dL (ref 8.9–10.3)
Chloride: 101 mmol/L (ref 98–111)
Creatinine, Ser: 1.42 mg/dL — ABNORMAL HIGH (ref 0.44–1.00)
GFR, Estimated: 38 mL/min — ABNORMAL LOW (ref >60)
Glucose, Bld: 120 mg/dL — ABNORMAL HIGH (ref 70–99)
Potassium: 3.8 mmol/L (ref 3.5–5.1)
Sodium: 139 mmol/L (ref 135–145)

## 2022-01-17 LAB — TROPONIN I (HIGH SENSITIVITY)
Troponin I (High Sensitivity): 12 ng/L (ref <18)
Troponin I (High Sensitivity): 14 ng/L (ref <18)

## 2022-01-17 LAB — TSH: TSH: 5.905 u[IU]/mL — ABNORMAL HIGH (ref 0.350–4.500)

## 2022-01-17 MED ORDER — POTASSIUM CHLORIDE CRYS ER 20 MEQ PO TBCR
40.0000 meq | EXTENDED_RELEASE_TABLET | Freq: Once | ORAL | Status: AC
  Administered 2022-01-17: 40 meq via ORAL
  Filled 2022-01-17: qty 2

## 2022-01-17 MED ORDER — ALPRAZOLAM 0.25 MG PO TABS
0.2500 mg | ORAL_TABLET | Freq: Three times a day (TID) | ORAL | Status: DC | PRN
  Administered 2022-01-18 – 2022-01-19 (×2): 0.5 mg via ORAL
  Filled 2022-01-17 (×3): qty 2

## 2022-01-17 MED ORDER — APIXABAN 5 MG PO TABS
5.0000 mg | ORAL_TABLET | Freq: Two times a day (BID) | ORAL | Status: DC
Start: 2022-01-17 — End: 2022-01-22
  Administered 2022-01-17 – 2022-01-22 (×10): 5 mg via ORAL
  Filled 2022-01-17 (×10): qty 1

## 2022-01-17 MED ORDER — PROPOFOL 10 MG/ML IV BOLUS
INTRAVENOUS | Status: AC | PRN
  Administered 2022-01-17: 42 mg via INTRAVENOUS

## 2022-01-17 MED ORDER — PROPOFOL 10 MG/ML IV BOLUS
1.0000 mg/kg | Freq: Once | INTRAVENOUS | Status: DC
  Filled 2022-01-17: qty 20

## 2022-01-17 MED ORDER — PANTOPRAZOLE SODIUM 40 MG PO TBEC
40.0000 mg | DELAYED_RELEASE_TABLET | Freq: Every day | ORAL | Status: DC
  Administered 2022-01-17 – 2022-01-22 (×6): 40 mg via ORAL
  Filled 2022-01-17 (×6): qty 1

## 2022-01-17 MED ORDER — MAGNESIUM SULFATE 2 GM/50ML IV SOLN
2.0000 g | Freq: Once | INTRAVENOUS | Status: AC
  Administered 2022-01-17: 2 g via INTRAVENOUS
  Filled 2022-01-17: qty 50

## 2022-01-17 MED ORDER — FUROSEMIDE 10 MG/ML IJ SOLN
40.0000 mg | Freq: Once | INTRAMUSCULAR | Status: AC
  Administered 2022-01-17: 40 mg via INTRAVENOUS
  Filled 2022-01-17: qty 4

## 2022-01-17 MED ORDER — ACETAMINOPHEN 325 MG PO TABS
650.0000 mg | ORAL_TABLET | Freq: Four times a day (QID) | ORAL | Status: DC | PRN
  Administered 2022-01-19 – 2022-01-21 (×2): 650 mg via ORAL
  Filled 2022-01-17 (×2): qty 2

## 2022-01-17 MED ORDER — ACETAMINOPHEN 650 MG RE SUPP: 650.0000 mg | Freq: Four times a day (QID) | RECTAL | Status: DC | PRN

## 2022-01-17 MED ORDER — PAROXETINE HCL 20 MG PO TABS
20.0000 mg | ORAL_TABLET | Freq: Two times a day (BID) | ORAL | Status: DC
  Administered 2022-01-17 – 2022-01-22 (×10): 20 mg via ORAL
  Filled 2022-01-17 (×13): qty 1

## 2022-01-17 MED ORDER — AMIODARONE HCL 200 MG PO TABS
200.0000 mg | ORAL_TABLET | Freq: Two times a day (BID) | ORAL | Status: DC
  Administered 2022-01-18: 200 mg via ORAL
  Filled 2022-01-17: qty 1

## 2022-01-17 MED ORDER — POLYETHYLENE GLYCOL 3350 17 G PO PACK
17.0000 g | PACK | Freq: Every day | ORAL | Status: DC | PRN
  Administered 2022-01-19: 22:00:00 17 g via ORAL
  Filled 2022-01-17: qty 1

## 2022-01-17 NOTE — Assessment & Plan Note (Addendum)
-  Continue Pantoprazole 40 mg po Daily

## 2022-01-17 NOTE — Assessment & Plan Note (Addendum)
-  Cardioverted in the ED and went sinus rhythm but bradycardic with heart rate in the 40s to 50s but unfortunately went back into A Fib With RVR -**Now she was Cardioverted with TEE/DCCV today and her TEE showed moderately reduced EF with no evidence of left atrial appendage thrombus and her cardioversion was successful with 200 J and times but was noted with brief ectopic atrial rhythms noted as well -Not hypotensive. -C/w Cardiac monitoring.   -Hold home metoprolol and Placed on Amiodarone gtt  With plans to transition to po per Cardiology recc's but given her Uncontrolled rates she is getting rebolused with Amiodarone; given that her heart has remained in normal sinus rhythm cardiology transitioned her to amiodarone.  Her amiodarone had some extravasation yesterday given her IV infiltration so to be given hyaluronidase 150 units subcu -C/w Anticoagulation with Apixaban 5 mg po BID -Patient's right arm is looking better but she still has a 2 cm indurated not from the amiodarone extravasation

## 2022-01-17 NOTE — ED Provider Notes (Signed)
[facility]ERGENC[facility] DEPARTMENT Provider Note   CSN: [id number] Arrival date & time: 01/17/22  1504     History  Chief Complaint  Patient presents with   Shortness of Breath    DANITA PROUD is a 76 y.o. female.   Shortness of Breath Patient presents for shortness of breath and palpitations.  She has a history of A-fib and underwent a cardioversion on 2/13.  Symptoms returned this morning while showering.  She went to her PCPs office.  She has had 8 pound weight gain in the past week.  She has pitting edema in both her legs.  She is on metoprolol, amiodarone?, and Eliquis.  She has not missed any Eliquis doses.  Her last dose was this morning.  She also takes Lasix every other day.  Her last dose of Lasix was Sunday night.  She does endorse gradual worsening of shortness of breath and lower extremity edema.  She denies any areas of pain other than her swollen ankles.    Home Medications Prior to Admission medications   Medication Sig Start Date End Date Taking? Authorizing Provider  acetaminophen (TYLENOL) 500 MG tablet Take 500 mg by mouth every 6 (six) hours as needed (pain.).   Yes [provider]  ALPRAZolam (XANAX) 0.5 MG tablet Take 1 tablet (0.5 mg total) by mouth 3 (three) times daily as needed for anxiety. TAKE 1 TABLET BY MOUTH THREE TIMES DAILY AS NEEDED FOR ANXIETY OR SLEEP Patient taking differently: Take 0.25-0.5 mg by mouth 3 (three) times daily as needed for anxiety or sleep. 12/16/21  Yes Susy Frizzle, MD  amiodarone (PACERONE) 200 MG tablet Take 1 tablet by mouth twice a day for one month then reduce to 1 tablet daily Patient taking differently: Take 200 mg by mouth 2 (two) times daily. 01/11/22  Yes Sherran Needs, NP  amLODipine (NORVASC) 10 MG tablet Take 10 mg by mouth in the morning.   Yes [provider]  apixaban (ELIQUIS) 5 MG TABS tablet Take 1 tablet (5 mg total) by mouth 2 (two) times daily. 12/29/21 03/29/22 Yes  Sherran Needs, NP  clobetasol cream (TEMOVATE) 3.61 % APPLY ONE APPLICATION TOPICALLY TWO TIMES DAILY Patient taking differently: Apply 1 application topically See admin instructions. APPLY ONE APPLICATION TOPICALLY TWO TIMES DAILY prn 03/03/19  Yes Susy Frizzle, MD  furosemide (LASIX) 40 MG tablet Take 1 tablet (40 mg total) by mouth daily. Patient taking differently: Take 40 mg by mouth every other day. 12/29/21  Yes Sherran Needs, NP  hydrochlorothiazide (MICROZIDE) 12.5 MG capsule Take 12.5 mg by mouth daily.   Yes [provider]  losartan (COZAAR) 50 MG tablet Take 50 mg by mouth daily.   Yes [provider]  meloxicam (MOBIC) 15 MG tablet Take 15 mg by mouth daily as needed for pain.   Yes [provider]  metoprolol tartrate (LOPRESSOR) 50 MG tablet Take 1 tablet by mouth 2  times daily. 12/29/21 03/29/22 Yes Sherran Needs, NP  Multiple Vitamin (MULITIVITAMIN WITH MINERALS) TABS Take 1 tablet by mouth daily.   Yes [provider]  ondansetron (ZOFRAN) 4 MG tablet Take 1 tablet (4 mg total) by mouth every 6 (six) hours as needed for nausea. 4/43/15  Yes Delora Fuel, MD  pantoprazole (PROTONIX) 40 MG tablet TAKE 1 TABLET BY MOUTH  DAILY Patient taking differently: Take 40 mg by mouth daily. 08/08/21  Yes Susy Frizzle, MD  PARoxetine (PAXIL) 20  MG tablet TAKE 1 TABLET BY MOUTH  TWICE DAILY Patient taking differently: Take 20 mg by mouth in the morning and at bedtime. 08/08/21  Yes Susy Frizzle, MD  potassium chloride SA (KLOR-CON M) 20 MEQ tablet Take 1 tablet (20 mEq total) by mouth daily. 12/29/21  Yes Sherran Needs, NP      Allergies    Morphine and related and Nitrofurantoin    Review of Systems   Review of Systems  Respiratory:  Positive for shortness of breath.   Cardiovascular:  Positive for palpitations and leg swelling.  All other systems reviewed and are negative.  Physical Exam Updated Vital Signs BP 134/86    Pulse (!)  118    Temp 98.5 F (36.9 C) (Oral)    Resp 16    Ht 5\' 2"  (1.575 m)    Wt 84 kg    SpO2 94%    BMI 33.87 kg/m  Physical Exam Vitals and nursing note reviewed.  Constitutional:      General: She is not in acute distress.    Appearance: She is well-developed. She is not ill-appearing, toxic-appearing or diaphoretic.  HENT:     Head: Normocephalic and atraumatic.     Mouth/Throat:     Mouth: Mucous membranes are moist.     Pharynx: Oropharynx is clear.  Eyes:     Extraocular Movements: Extraocular movements intact.     Conjunctiva/sclera: Conjunctivae normal.  Cardiovascular:     Rate and Rhythm: Tachycardia present. Rhythm irregular.     Heart sounds: No murmur heard. Pulmonary:     Effort: Pulmonary effort is normal. Tachypnea present. No respiratory distress.     Breath sounds: Examination of the right-lower field reveals rales. Examination of the left-lower field reveals rales. Rales present. No decreased breath sounds or wheezing.     Comments: Speaking in full sentences Chest:     Chest wall: No tenderness or edema.  Abdominal:     Palpations: Abdomen is soft.     Tenderness: There is no abdominal tenderness.  Musculoskeletal:        General: No swelling.     Cervical back: Normal range of motion and neck supple.     Right lower leg: Edema present.     Left lower leg: Edema present.  Skin:    General: Skin is warm and dry.     Capillary Refill: Capillary refill takes less than 2 seconds.  Neurological:     General: No focal deficit present.     Mental Status: She is alert and oriented to person, place, and time.  Psychiatric:        Mood and Affect: Mood normal. Mood is not anxious.        Behavior: Behavior normal. Behavior is not agitated.    ED Results / Procedures / Treatments   Labs (all labs ordered are listed, but only abnormal results are displayed) Labs Reviewed  BASIC METABOLIC PANEL - Abnormal; Notable for the following components:      Result Value    Glucose, Bld 120 (*)    Creatinine, Ser 1.42 (*)    GFR, Estimated 38 (*)    All other components within normal limits  MAGNESIUM - Abnormal; Notable for the following components:   Magnesium 1.6 (*)    All other components within normal limits  CBC - Abnormal; Notable for the following components:   RBC 5.51 (*)    HCT 47.1 (*)    All other components  within normal limits  TSH - Abnormal; Notable for the following components:   TSH 5.905 (*)    All other components within normal limits  BRAIN NATRIURETIC PEPTIDE - Abnormal; Notable for the following components:   B Natriuretic Peptide 663.7 (*)    All other components within normal limits  BASIC METABOLIC PANEL - Abnormal; Notable for the following components:   Glucose, Bld 117 (*)    Creatinine, Ser 1.22 (*)    Calcium 8.6 (*)    GFR, Estimated 46 (*)    All other components within normal limits  MAGNESIUM  T4, FREE  TROPONIN I (HIGH SENSITIVITY)  TROPONIN I (HIGH SENSITIVITY)    EKG EKG Interpretation  Date/Time:  Tuesday January 17 2022 16:23:43 EST Ventricular Rate:  54 PR Interval:  188 QRS Duration: 58 QT Interval:  527 QTC Calculation: 500 R Axis:   113 Text Interpretation: Sinus rhythm Low voltage, extremity and precordial leads Anteroseptal infarct, old Confirmed by Godfrey Pick [phone]) on 01/17/2022 5:18:02 PM  Radiology DG Chest Port 1 View  Result Date: 01/17/2022 CLINICAL DATA:  SOB and palpitations today. Pt had a-fib with cardioversion on Friday. Hx of HTN, A-fib. Shortness of breath, palpitations EXAM: PORTABLE CHEST - 1 VIEW COMPARISON:  12/04/2021 FINDINGS: New moderate left pleural effusion. Atelectasis/consolidation at the left lung base. Mild perihilar interstitial edema suspected. Heart size upper limits normal for technique. Aortic Atherosclerosis (ICD10-170.0). No pneumothorax. Visualized bones unremarkable. IMPRESSION: 1. New moderate left pleural effusion. 2. Suspect mild perihilar interstitial  edema Electronically Signed   By: Lucrezia Europe M.D.   On: 01/17/2022 15:49   ECHOCARDIOGRAM COMPLETE  Result Date: 01/18/2022    ECHOCARDIOGRAM REPORT   Patient Name:   LORREE MILLAR Date of Exam: 01/18/2022 Medical Rec #:  [id number]         Height:       62.0 in Accession #:    [id number]        Weight:       185.2 lb Date of Birth:  02/03/1946         BSA:          1.850 m Patient Age:    74 years          BP:           93/68 mmHg Patient Gender: F                 HR:           106 bpm. Exam Location:  Inpatient Procedure: 2D Echo Indications:    Acute systolic CHF  History:        Patient has prior history of Echocardiogram examinations, most                 recent 12/05/2021. Arrythmias:Atrial Fibrillation; Risk                 Factors:Hypertension.  Sonographer:    Arlyss Gandy Referring Phys: [id number] OVFIEPPIR RATHORE  Sonographer Comments: Image acquisition challenging due to patient body habitus. IMPRESSIONS  1. Left ventricular ejection fraction, by estimation, is 40 to 45%. The left ventricle has mildly decreased function. The left ventricle demonstrates global hypokinesis. Left ventricular diastolic parameters are indeterminate.  2. Right ventricular systolic function is normal. The right ventricular size is mildly enlarged. There is normal pulmonary artery systolic pressure. The estimated right ventricular systolic pressure is 51.8 mmHg.  3. The pericardial effusion is circumferential. There is no evidence of cardiac  tamponade.  4. The mitral valve is normal in structure. Mild mitral valve regurgitation. No evidence of mitral stenosis.  5. Tricuspid valve regurgitation is moderate.  6. The aortic valve is normal in structure. Aortic valve regurgitation is not visualized. No aortic stenosis is present.  7. The inferior vena cava is dilated in size with <50% respiratory variability, suggesting right atrial pressure of 15 mmHg. Comparison(s): Prior images reviewed side by side. The left ventricular  function is worsened. FINDINGS  Left Ventricle: Left ventricular ejection fraction, by estimation, is 40 to 45%. The left ventricle has mildly decreased function. The left ventricle demonstrates global hypokinesis. The left ventricular internal cavity size was normal in size. There is  no left ventricular hypertrophy. Left ventricular diastolic parameters are indeterminate. Right Ventricle: The right ventricular size is mildly enlarged. No increase in right ventricular wall thickness. Right ventricular systolic function is normal. There is normal pulmonary artery systolic pressure. The tricuspid regurgitant velocity is 2.08  m/s, and with an assumed right atrial pressure of 15 mmHg, the estimated right ventricular systolic pressure is 50.2 mmHg. Left Atrium: Left atrial size was normal in size. Right Atrium: Right atrial size was normal in size. Pericardium: Trivial pericardial effusion is present. The pericardial effusion is circumferential. There is no evidence of cardiac tamponade. Mitral Valve: The mitral valve is normal in structure. Mild mitral valve regurgitation. No evidence of mitral valve stenosis. Tricuspid Valve: The tricuspid valve is normal in structure. Tricuspid valve regurgitation is moderate . No evidence of tricuspid stenosis. Aortic Valve: The aortic valve is normal in structure. Aortic valve regurgitation is not visualized. No aortic stenosis is present. Aortic valve mean gradient measures 1.0 mmHg. Aortic valve peak gradient measures 1.9 mmHg. Aortic valve area, by VTI measures 1.85 cm. Pulmonic Valve: The pulmonic valve was normal in structure. Pulmonic valve regurgitation is trivial. No evidence of pulmonic stenosis. Aorta: The aortic root is normal in size and structure. Venous: The inferior vena cava is dilated in size with less than 50% respiratory variability, suggesting right atrial pressure of 15 mmHg. IAS/Shunts: No atrial level shunt detected by color flow Doppler.  LEFT VENTRICLE  PLAX 2D LVIDd:         3.80 cm   Diastology LVIDs:         3.06 cm   LV e' medial:    7.07 cm/s LV PW:         0.75 cm   LV E/e' medial:  9.8 LV IVS:        0.76 cm   LV e' lateral:   8.70 cm/s LVOT diam:     1.60 cm   LV E/e' lateral: 8.0 LV SV:         24 LV SV Index:   13 LVOT Area:     2.01 cm  RIGHT VENTRICLE RV Basal diam:  3.98 cm RV Mid diam:    2.96 cm RV S prime:     6.96 cm/s TAPSE (M-mode): 1.0 cm LEFT ATRIUM             Index        RIGHT ATRIUM           Index LA diam:        3.70 cm 2.00 cm/m   RA Area:     18.90 cm LA Vol (A2C):   44.4 ml 24.00 ml/m  RA Volume:   52.00 ml  28.11 ml/m LA Vol (A4C):   50.8 ml 27.46 ml/m  LA Biplane Vol: 51.5 ml 27.84 ml/m  AORTIC VALVE AV Area (Vmax):    1.87 cm AV Area (Vmean):   1.85 cm AV Area (VTI):     1.85 cm AV Vmax:           69.80 cm/s AV Vmean:          49.500 cm/s AV VTI:            0.128 m AV Peak Grad:      1.9 mmHg AV Mean Grad:      1.0 mmHg LVOT Vmax:         64.80 cm/s LVOT Vmean:        45.600 cm/s LVOT VTI:          0.118 m LVOT/AV VTI ratio: 0.92  AORTA Ao Root diam: 2.80 cm Ao Asc diam:  3.10 cm MITRAL VALVE               TRICUSPID VALVE MV Area (PHT): 5.13 cm    TR Peak grad:   17.3 mmHg MV Decel Time: 148 msec    TR Vmax:        208.00 cm/s MV E velocity: 69.20 cm/s                            SHUNTS                            Systemic VTI:  0.12 m                            Systemic Diam: 1.60 cm [name] MD Electronically signed by [name] MD Signature Date/Time: 01/18/2022/10:45:33 AM    Final     Procedures .Cardioversion  Date/Time: 01/17/2022 4:27 PM Performed by: Godfrey Pick, MD Authorized by: Godfrey Pick, MD   Consent:    Consent obtained:  Verbal and written   Consent given by:  Patient   Risks discussed:  Death, induced arrhythmia and pain   Alternatives discussed:  No treatment, rate-control medication and delayed treatment Pre-procedure details:    Cardioversion basis:  Elective   Rhythm:  Atrial  fibrillation   Electrode placement:  Anterior-posterior Patient sedated: Yes. Refer to sedation procedure documentation for details of sedation.  Attempt one:    Cardioversion mode:  Synchronous   Shock (Joules):  150   Shock outcome:  Conversion to normal sinus rhythm Post-procedure details:    Patient status:  Awake   Patient tolerance of procedure:  Tolerated well, no immediate complications .Sedation  Date/Time: 01/17/2022 4:27 PM Performed by: Godfrey Pick, MD Authorized by: Godfrey Pick, MD   Consent:    Consent obtained:  Verbal and written   Consent given by:  Patient   Risks discussed:  Prolonged hypoxia resulting in organ damage, dysrhythmia, inadequate sedation, respiratory compromise necessitating ventilatory assistance and intubation and nausea Universal protocol:    Immediately prior to procedure, a time out was called: yes   Indications:    Procedure performed:  Cardioversion   Procedure necessitating sedation performed by:  Physician performing sedation Pre-sedation assessment:    Time since last food or drink:  2 hours   ASA classification: class 3 - patient with severe systemic disease     Mouth opening:  2 finger widths   Thyromental distance:  3 finger widths   Mallampati score:  III - soft palate, base of uvula visible   Neck mobility: normal     Pre-sedation assessments completed and reviewed: airway patency, cardiovascular function, hydration status, mental status, nausea/vomiting, pain level and respiratory function     Pre-sedation assessment completed:  01/17/2022 4:09 PM Immediate pre-procedure details:    Reassessment: Patient reassessed immediately prior to procedure     Reviewed: vital signs     Verified: bag valve mask available, emergency equipment available, intubation equipment available, IV patency confirmed and oxygen available   Procedure details (see MAR for exact dosages):    Preoxygenation:  Room air   Sedation:  Propofol   Intended level  of sedation: moderate (conscious sedation)   Analgesia:  None   Intra-procedure monitoring:  Blood pressure monitoring, continuous capnometry, frequent LOC assessments, cardiac monitor, continuous pulse oximetry and frequent vital sign checks   Intra-procedure events: none     Total Provider sedation time (minutes):  10 Post-procedure details:    Post-sedation assessment completed:  01/17/2022 4:29 PM   Attendance: Constant attendance by certified staff until patient recovered     Recovery: Patient returned to pre-procedure baseline     Post-sedation assessments completed and reviewed: airway patency, cardiovascular function, hydration status, mental status, nausea/vomiting, pain level and respiratory function     Patient is stable for discharge or admission: yes     Procedure completion:  Tolerated well, no immediate complications    Medications Ordered in ED Medications  ALPRAZolam (XANAX) tablet 0.25-0.5 mg (has no administration in time range)  PARoxetine (PAXIL) tablet 20 mg (20 mg Oral Given 01/18/22 1055)  pantoprazole (PROTONIX) EC tablet 40 mg (40 mg Oral Given 01/18/22 1055)  apixaban (ELIQUIS) tablet 5 mg (5 mg Oral Given 01/18/22 1055)  acetaminophen (TYLENOL) tablet 650 mg (has no administration in time range)    Or  acetaminophen (TYLENOL) suppository 650 mg (has no administration in time range)  polyethylene glycol (MIRALAX / GLYCOLAX) packet 17 g (has no administration in time range)  furosemide (LASIX) injection 40 mg (40 mg Intravenous Given 01/18/22 0404)  amiodarone (NEXTERONE PREMIX) 360-4.14 MG/200ML-% (1.8 mg/mL) IV infusion (60 mg/hr Intravenous New Bag/Given 01/18/22 0432)    Followed by  amiodarone (NEXTERONE PREMIX) 360-4.14 MG/200ML-% (1.8 mg/mL) IV infusion (30 mg/hr Intravenous New Bag/Given 01/18/22 1055)  furosemide (LASIX) injection 40 mg (40 mg Intravenous Given 01/17/22 1728)  propofol (DIPRIVAN) 10 mg/mL bolus/IV push (42 mg Intravenous Given 01/17/22 1620)   magnesium sulfate IVPB 2 g 50 mL (0 g Intravenous Stopped 01/17/22 1852)  potassium chloride SA (KLOR-CON M) CR tablet 40 mEq (40 mEq Oral Given 01/17/22 1725)  amiodarone (NEXTERONE) 1.8 mg/mL load via infusion 150 mg (150 mg Intravenous Bolus from Bag 01/18/22 0416)    ED Course/ Medical Decision Making/ A&P                           Medical Decision Making Amount and/or Complexity of Data Reviewed Labs: ordered. Radiology: ordered. ECG/medicine tests: ordered and independent interpretation performed.  Risk Prescription drug management. Decision regarding hospitalization.   This patient presents to the ED for concern of shortness of breath, this involves an extensive number of treatment options, and is a complaint that carries with it a high risk of complications and morbidity.  The differential diagnosis includes CHF, pulmonary edema, ACS, atrial fibrillation with RVR, pneumonia, PE   Co morbidities that complicate the patient evaluation  Anxiety, depression, HTN, atrial fibrillation, IBS, arthritis  Additional history obtained:  Additional history obtained from N/A External records from outside source obtained and reviewed including EMR   Lab Tests:  I Ordered, and personally interpreted labs.  The pertinent results include: Normal electrolytes, normal hemoglobin, no leukocytosis, slightly elevated TSH, elevated BNP, normal troponins, similar creatinine to lab work from a week ago which is elevated from baseline   Imaging Studies ordered:  I ordered imaging studies including chest x-ray I independently visualized and interpreted imaging which showed left lateral effusion, increased size of cardiac silhouette when compared to prior x-ray I agree with the radiologist interpretation   Cardiac Monitoring:  The patient was maintained on a cardiac monitor.  I personally viewed and interpreted the cardiac monitored which showed an underlying rhythm of: Atrial fibrillation  with RVR initially, sinus rhythm following cardioversion   Medicines ordered and prescription drug management:  I ordered medication including propofol for procedural sedation; potassium chloride and magnesium sulfate for electrolyte replacement; Lasix for diuresis Reevaluation of the patient after these medicines showed that the patient improved I have reviewed the patients home medicines and have made adjustments as needed  Critical Interventions:  Elective cardioversion for atrial fibrillation with RVR, replacement of electrolytes, Lasix for volume overload, consultation with cardiology for new pericardial effusion   Consultations Obtained:  I requested consultation with the cardiologist,  and discussed lab and imaging findings as well as pertinent plan - they recommend: Admission for diuresis and further work-up including formal echocardiogram   Problem List / ED Course:  Very pleasant 76 year old female presenting for 1 week of worsening shortness of breath.  She has not had any associated chest pain.  She was seen by her primary care doctor earlier today and found to be in atrial fibrillation with RVR.  She was sent to the ED.  Upon arrival in the ED, she remains in atrial fibrillation with RVR.  She does have increased work of breathing.  Patient has been on Eliquis for approximately the last 5 weeks.  She did undergo a cardioversion 1 week ago without complication.  Patient is amenable to cardioversion here in the ED.  At this time, her blood pressures are normal.  I did speak with cardiology who agrees with plan.  Patient was given propofol for procedural sedation.  She underwent cardioversion successfully.  There were no complications.  Following cardioversion, blood pressures were soft.  She did have significantly improved work of breathing.  I did perform a bedside echocardiogram which showed a new pericardial effusion, moderate left-sided pleural effusion, and a slight right-sided  pleural effusion.  Pericardial effusion does not show evidence of tamponade.  Lab work showed hypomagnesemia, which was replaced in the ED.  She was also given potassium for electrolyte optimization prior to Lasix.  She was given 40 mg dose of IV Lasix for diuresis.  On further reassessment, patient resting comfortably.  Breathing remains improved.  Patient was admitted to hospitalist for further management.   Reevaluation:  After the interventions noted above, I reevaluated the patient and found that they have :improved   Social Determinants of Health:  Lives alone   Dispostion:  After consideration of the diagnostic results and the patients response to treatment, I feel that the patent would benefit from admission.   CRITICAL CARE Performed by: Godfrey Pick   Total critical care time: 35 minutes  Critical care time was exclusive of separately billable procedures and treating other patients.  Critical care was necessary to treat or prevent imminent or life-threatening  deterioration.  Critical care was time spent personally by me on the following activities: development of treatment plan with patient and/or surrogate as well as nursing, discussions with consultants, evaluation of patient's response to treatment, examination of patient, obtaining history from patient or surrogate, ordering and performing treatments and interventions, ordering and review of laboratory studies, ordering and review of radiographic studies, pulse oximetry and re-evaluation of patient's condition.         Final Clinical Impression(s) / ED Diagnoses Final diagnoses:  Atrial fibrillation with RVR (HCC)  SOB (shortness of breath)  Pericardial effusion  Pleural effusion    Rx / DC Orders ED Discharge Orders          Ordered    Amb referral to AFIB Clinic        01/17/22 1538              Godfrey Pick, MD 01/18/22 1348

## 2022-01-17 NOTE — Assessment & Plan Note (Addendum)
-  Continue Paroxetine 20 mg po BID, Alprazolam 0.25-0.5 mg po TID prn

## 2022-01-17 NOTE — Sedation Documentation (Signed)
Dr Doren Custard administers 150J synchronized shock

## 2022-01-17 NOTE — Assessment & Plan Note (Addendum)
-  Stable. -Lasix given for diuresis.  -Currently Hold hydrochlorothiazide and losartan given AKI but will stop HCTZ and Defer to Cardiology when to resume ARB in the outpatient setting  -Continue to Monitor BP per Protocol; Last BP was 121/69

## 2022-01-17 NOTE — Assessment & Plan Note (Addendum)
-  Likely secondary to uncontrolled A-fib.  BNP 663.   -Chest x-ray showing new moderate left pleural effusion and mild perihilar interstitial edema.   -TTE done 12/05/2021 showing EF 55 to 00%, diastolic parameters indeterminate, and mild mitral regurgitation.  -TEE done 01/09/2022 showing EF 60 to 65%.  ED physician had spoken to Dr. Harl Bowie from cardiology who recommended diuresis and repeat echocardiogram. -Repeat transthoracic echocardiogram ordered and done and showed "Left ventricular ejection fraction, by estimation, is 40 to 45%. The  left ventricle has mildly decreased function. The left ventricle demonstrates global hypokinesis. Left ventricular diastolic parameters are  Indeterminate." -Beta-blocker is being held given her hypotension and ARB is also being held given AKI -IV Lasix has been changed to p.o. Lasix -Continue to monitor intake and output, daily weights.  -Patient is - 3947 mL since admission; admission weight was 185.19 pounds but repeat is improved to 177.69 -Continue Low-sodium diet with fluid restriction. -Further Care per Cardiology outpatient appointment with them within 1 week

## 2022-01-17 NOTE — Assessment & Plan Note (Addendum)
-  Magnesium replaced again with IV Mag Sulfate 1 grams -Mag Level went from 1.6 -> 1.9 -> 1.7 -> 1.8 -> 1.9 x2  And was 1.7 -We will replete with IV mag sulfate 2 g prior to D/C -Continue to Monitor and Replete as Necessary -Repeat Mag Level in the AM

## 2022-01-17 NOTE — ED Notes (Signed)
Son [name] [phone] would like an update asap

## 2022-01-17 NOTE — Assessment & Plan Note (Addendum)
-  Informed by ED physician that bedside echo showing new small to moderate pericardial effusion without signs of tamponade.   -There was no pericardial effusion seen on TEE done on 01/09/2022.     -Hypothyroidism could be a possible etiology as TSH is elevated on labs at 5.905 -Routine transthoracic echocardiogram ordered for further evaluation and showed "Trivial pericardial effusion is present. The pericardial effusion is circumferential. There is no evidence of cardiac tamponade." -Checking free T4 level and was 1.05 -Currently no signs of tamponade

## 2022-01-17 NOTE — H&P (Signed)
History and Physical    Kristina Dougherty DVV:[id number] DOB: Oct 26, 1946 DOA: 01/17/2022  PCP: Susy Frizzle, MD  Patient coming from: Home  HPI: Patient is a 76 year old female with a past medical history of A-fib on Eliquis, hypertension, anxiety, depression, GERD, tobacco use.  Diagnosed with new onset A-fib with RVR during hospitalization last month and was started on metoprolol 50 mg twice daily.  Also started on Eliquis due to CHA2DS2-VASc score of at least 4. Seen in the ED on 01/09/2022 for abdominal pain and found to be in A-fib with RVR and underwent successful cardioversion the same day.  Followed up at cardiology clinic 3 days later and was again in A-fib with RVR and started on amiodarone 200 mg twice daily.  Patient was seen by PCP today for volume overload and found to be in persistent A-fib with RVR.  Sent to the ED for further evaluation.  Rate was in the 140s on arrival to the ED.  No fever or leukocytosis.  Creatinine 1.4, baseline 0.7-0.9.  Potassium 3.8.  Magnesium 1.6.  TSH 5.9.  High-sensitivity troponin negative x2.  BNP 663.  Chest x-ray showing new moderate left pleural effusion and mild perihilar interstitial edema.  Bedside echo showing new small to moderate pericardial effusion without signs of tamponade.  There was no pericardial effusion seen on TEE done on 01/09/2022.  Cardioverted in the ED today and converted to sinus rhythm. Patient was given IV Lasix 40 mg, oral potassium 40 mEq, and IV magnesium 2 g.  Patient states she has gained about 8 or 9 pounds in the past 2 weeks and her legs are swollen.  Reports increasing shortness of breath for the past 3 days, especially with exertion.  She is taking Lasix every other day.  States she was in A-fib and shocked a week ago and then shocked again in the emergency room today.  She now feels better.  Denies fevers, cough, or chest pain.  States she was seen in the emergency room a week ago for vomiting and abdominal pain but  symptoms have now resolved and she feels much better and is able to eat but still feels constipated.  No other complaints.   Review of Systems:  All systems reviewed and apart from history of presenting illness, are negative.  Past Medical History:  Diagnosis Date   Anxiety    Arthritis    Atrial fibrillation ([place])    Deaf, right    Depression    Hypertension    Osteopenia     Past Surgical History:  Procedure Laterality Date   ABDOMINAL HYSTERECTOMY     APPENDECTOMY     BUBBLE STUDY  01/09/2022   Procedure: BUBBLE STUDY;  Surgeon: [name], MD;  Location: [place];  Service: Cardiovascular;;   CARDIOVERSION N/A 01/09/2022   Procedure: CARDIOVERSION;  Surgeon: [name], MD;  Location: [place];  Service: Cardiovascular;  Laterality: N/A;   TEE WITHOUT CARDIOVERSION N/A 01/09/2022   Procedure: TRANSESOPHAGEAL ECHOCARDIOGRAM (TEE);  Surgeon: [name], MD;  Location: [facility] ENDOSCOPY;  Service: Cardiovascular;  Laterality: N/A;     reports that she has been smoking cigarettes. She has been smoking an average of .3 packs per day. She has never used smokeless tobacco. She reports that she does not drink alcohol and does not use drugs.  Allergies  Allergen Reactions   Morphine And Related     Severe HA per pt   Nitrofurantoin Nausea And Vomiting    Family History  Problem Relation Age of Onset   Hypertension Maternal Grandmother     Prior to Admission medications   Medication Sig Start Date End Date Taking? Authorizing Provider  acetaminophen (TYLENOL) 500 MG tablet Take 500 mg by mouth every 6 (six) hours as needed (pain.).   Yes [provider]  ALPRAZolam (XANAX) 0.5 MG tablet Take 1 tablet (0.5 mg total) by mouth 3 (three) times daily as needed for anxiety. TAKE 1 TABLET BY MOUTH THREE TIMES DAILY AS NEEDED FOR ANXIETY OR SLEEP Patient taking differently: Take 0.25-0.5 mg by mouth 3 (three) times daily as needed for anxiety or sleep.  12/16/21  Yes Susy Frizzle, MD  amiodarone (PACERONE) 200 MG tablet Take 1 tablet by mouth twice a day for one month then reduce to 1 tablet daily Patient taking differently: Take 200 mg by mouth 2 (two) times daily. 01/11/22  Yes Sherran Needs, NP  amLODipine (NORVASC) 10 MG tablet Take 10 mg by mouth in the morning.   Yes [provider]  apixaban (ELIQUIS) 5 MG TABS tablet Take 1 tablet (5 mg total) by mouth 2 (two) times daily. 12/29/21 03/29/22 Yes Sherran Needs, NP  clobetasol cream (TEMOVATE) 2.69 % APPLY ONE APPLICATION TOPICALLY TWO TIMES DAILY Patient taking differently: Apply 1 application topically See admin instructions. APPLY ONE APPLICATION TOPICALLY TWO TIMES DAILY prn 03/03/19  Yes Susy Frizzle, MD  furosemide (LASIX) 40 MG tablet Take 1 tablet (40 mg total) by mouth daily. Patient taking differently: Take 40 mg by mouth every other day. 12/29/21  Yes Sherran Needs, NP  hydrochlorothiazide (MICROZIDE) 12.5 MG capsule Take 12.5 mg by mouth daily.   Yes [provider]  losartan (COZAAR) 50 MG tablet Take 50 mg by mouth daily.   Yes [provider]  meloxicam (MOBIC) 15 MG tablet Take 15 mg by mouth daily as needed for pain.   Yes [provider]  metoprolol tartrate (LOPRESSOR) 50 MG tablet Take 1 tablet by mouth 2  times daily. 12/29/21 03/29/22 Yes Sherran Needs, NP  Multiple Vitamin (MULITIVITAMIN WITH MINERALS) TABS Take 1 tablet by mouth daily.   Yes [provider]  ondansetron (ZOFRAN) 4 MG tablet Take 1 tablet (4 mg total) by mouth every 6 (six) hours as needed for nausea. 4/85/46  Yes Delora Fuel, MD  pantoprazole (PROTONIX) 40 MG tablet TAKE 1 TABLET BY MOUTH  DAILY Patient taking differently: Take 40 mg by mouth daily. 08/08/21  Yes Susy Frizzle, MD  PARoxetine (PAXIL) 20 MG tablet TAKE 1 TABLET BY MOUTH  TWICE DAILY Patient taking differently: Take 20 mg by mouth in the morning and at bedtime. 08/08/21  Yes  Susy Frizzle, MD  potassium chloride SA (KLOR-CON M) 20 MEQ tablet Take 1 tablet (20 mEq total) by mouth daily. 12/29/21  Yes Sherran Needs, NP    Physical Exam: Vitals:   01/17/22 1815 01/17/22 1830 01/17/22 1845 01/17/22 2045  BP: 107/77 123/84 106/74 112/75  Pulse: (!) 52 (!) 55 (!) 51 (!) 54  Resp: 13 18 14 12   Temp:      TempSrc:      SpO2: 94% 98% 95% 95%  Weight:      Height:        Physical Exam Constitutional:      General: She is not in acute distress. HENT:     Head: Normocephalic and atraumatic.  Eyes:     Extraocular Movements: Extraocular movements intact.  Conjunctiva/sclera: Conjunctivae normal.  Cardiovascular:     Rate and Rhythm: Regular rhythm. Bradycardia present.     Pulses: Normal pulses.  Pulmonary:     Effort: Pulmonary effort is normal. No respiratory distress.     Breath sounds: Rales present.     Comments: Diminished breath sounds at the left lung base Abdominal:     General: Bowel sounds are normal. There is no distension.     Palpations: Abdomen is soft.     Tenderness: There is no abdominal tenderness. There is no guarding or rebound.  Musculoskeletal:     Cervical back: Normal range of motion and neck supple.     Right lower leg: Edema present.     Left lower leg: Edema present.     Comments: +2 pitting edema of bilateral lower extremities  Skin:    General: Skin is warm and dry.  Neurological:     General: No focal deficit present.     Mental Status: She is alert and oriented to person, place, and time.     Labs on Admission: I have personally reviewed following labs and imaging studies  CBC: Recent Labs  Lab 01/17/22 1536  WBC 9.5  HGB 15.0  HCT 47.1*  MCV 85.5  PLT 950   Basic Metabolic Panel: Recent Labs  Lab 01/17/22 1536  NA 139  K 3.8  CL 101  CO2 24  GLUCOSE 120*  BUN 16  CREATININE 1.42*  CALCIUM 9.0  MG 1.6*   GFR: Estimated Creatinine Clearance: 33.4 mL/min (A) (by C-G formula based on SCr  of 1.42 mg/dL (H)). Liver Function Tests: No results for input(s): AST, ALT, ALKPHOS, BILITOT, PROT, ALBUMIN in the last 168 hours. No results for input(s): LIPASE, AMYLASE in the last 168 hours. No results for input(s): AMMONIA in the last 168 hours. Coagulation Profile: No results for input(s): INR, PROTIME in the last 168 hours. Cardiac Enzymes: No results for input(s): CKTOTAL, CKMB, CKMBINDEX, TROPONINI in the last 168 hours. BNP (last 3 results) No results for input(s): PROBNP in the last 8760 hours. HbA1C: No results for input(s): HGBA1C in the last 72 hours. CBG: No results for input(s): GLUCAP in the last 168 hours. Lipid Profile: No results for input(s): CHOL, HDL, LDLCALC, TRIG, CHOLHDL, LDLDIRECT in the last 72 hours. Thyroid Function Tests: Recent Labs    01/17/22 1537  TSH 5.905*   Anemia Panel: No results for input(s): VITAMINB12, FOLATE, FERRITIN, TIBC, IRON, RETICCTPCT in the last 72 hours. Urine analysis:    Component Value Date/Time   COLORURINE YELLOW 12/04/2021 1946   APPEARANCEUR CLEAR 12/04/2021 1946   LABSPEC 1.011 12/04/2021 1946   PHURINE 6.0 12/04/2021 1946   GLUCOSEU NEGATIVE 12/04/2021 1946   HGBUR SMALL (A) 12/04/2021 1946   HGBUR negative 11/25/2009 1508   BILIRUBINUR NEGATIVE 12/04/2021 1946   BILIRUBINUR n 01/26/2011 0000   KETONESUR 5 (A) 12/04/2021 1946   PROTEINUR NEGATIVE 12/04/2021 1946   UROBILINOGEN 0.2 04/15/2015 1412   NITRITE NEGATIVE 12/04/2021 1946   LEUKOCYTESUR NEGATIVE 12/04/2021 1946    Radiological Exams on Admission: I have personally reviewed images DG Chest Port 1 View  Result Date: 01/17/2022 CLINICAL DATA:  SOB and palpitations today. Pt had a-fib with cardioversion on Friday. Hx of HTN, A-fib. Shortness of breath, palpitations EXAM: PORTABLE CHEST - 1 VIEW COMPARISON:  12/04/2021 FINDINGS: New moderate left pleural effusion. Atelectasis/consolidation at the left lung base. Mild perihilar interstitial edema  suspected. Heart size upper limits normal for technique. Aortic  Atherosclerosis (ICD10-170.0). No pneumothorax. Visualized bones unremarkable. IMPRESSION: 1. New moderate left pleural effusion. 2. Suspect mild perihilar interstitial edema Electronically Signed   By: Lucrezia Europe M.D.   On: 01/17/2022 15:49    EKG: Personally reviewed.  A-fib with RVR, low voltage.  Assessment and Plan: * Atrial fibrillation with rapid ventricular response ([place])- (present on admission) Cardioverted in the ED today and now in sinus rhythm but bradycardic with heart rate in the 40s to 50s.  Not hypotensive. -Cardiac monitoring.  Hold home metoprolol and continue home amiodarone 200 mg twice daily (discussed with Dr. Haroldine Laws).  Continue Eliquis.  Volume overload- (present on admission) Likely secondary to uncontrolled A-fib.  BNP 663.  Chest x-ray showing new moderate left pleural effusion and mild perihilar interstitial edema.  TTE done 12/05/2021 showing EF 55 to 26%, diastolic parameters indeterminate, and mild mitral regurgitation.  TEE done 01/09/2022 showing EF 60 to 65%.  ED physician had spoken to Dr. Harl Bowie from cardiology who recommended diuresis and repeat echocardiogram. -Repeat transthoracic echocardiogram ordered.  Patient was given IV Lasix 40 mg in the ED.  Repeat labs in the morning and order additional doses of Lasix based on renal function.  Monitor intake and output, daily weights.  Low-sodium diet with fluid restriction.   Pericardial effusion- (present on admission) Informed by ED physician that bedside echo showing new small to moderate pericardial effusion without signs of tamponade.  There was no pericardial effusion seen on TEE done on 01/09/2022.    Hypothyroidism could be a possible etiology as TSH is elevated on labs. -Routine transthoracic echocardiogram ordered for further evaluation.  Checking free T4 level.  Abnormal thyroid function test- (present on admission) TSH 5.9.   -Check free T4  level  AKI (acute kidney injury) ([place])- (present on admission) Creatinine 1.4, baseline 0.7-0.9. -IV Lasix given for volume overload.  Avoid nephrotoxic agents/ hold home hydrochlorothiazide and losartan.  Monitor renal function and urine output.  GERD (gastroesophageal reflux disease) -Continue Protonix  Constipation- (present on admission) Patient was seen in the ED a week ago for abdominal pain, nausea, vomiting, and constipation.  CT abdomen pelvis done at that time showing no acute findings.  Most of her symptoms have now resolved except continues to endorse constipation.  Abdominal exam benign. -MiraLAX as needed  Hypomagnesemia- (present on admission) Magnesium replaced. -Continue to monitor  Essential hypertension- (present on admission) Stable. -Lasix given for diuresis.  Hold hydrochlorothiazide and losartan given AKI.  ANXIETY DEPRESSION- (present on admission) -Continue Paxil, Xanax prn   DVT prophylaxis: Eliquis Code Status: Full Code (discussed with the patient) Family Communication: No family available at this time. Consults called: Cardiology Level of care: Progressive Care Unit Admission status: It is my clinical opinion that admission to INPATIENT is reasonable and necessary because of the expectation that this patient will require hospital care that crosses at least 2 midnights to treat this condition based on the medical complexity of the problems presented.  Given the aforementioned information, the predictability of an adverse outcome is felt to be significant.   Shela Leff, MD [facility] 01/17/2022, 9:15 PM

## 2022-01-17 NOTE — Assessment & Plan Note (Addendum)
-  Creatinine 1.42 on admission and BUN/creatinine improved to 13/1.22 yesterday but slightly bumped today and is 11/1.26 -> 9/1.28 -> 9/1.28 has slowly bumped to 13/1.32 -> 12/1.34; Baseline 0.7-0.9. -Continued IV Lasix given for volume overload but now cardiology is transitioning to p.o. Lasix 40 mg p.o. daily.  And will continue at discharge -BUN/creatinine is relatively stable from yesterday -Avoid nephrotoxic agents/ hold home hydrochlorothiazide and losartan for now.  Avoid contrast dyes, hypotension and renally dose medications -Continue to monitor renal function and urine output closely -Repeat CMP in a.m.

## 2022-01-17 NOTE — Subjective & Objective (Addendum)
Patient is a 76 year old female with a past medical history of A-fib on Eliquis, hypertension, anxiety, depression, GERD, tobacco use.  Diagnosed with new onset A-fib with RVR during hospitalization last month and was started on metoprolol 50 mg twice daily.  Also started on Eliquis due to CHA2DS2-VASc score of at least 4. Seen in the ED on 01/09/2022 for abdominal pain and found to be in A-fib with RVR and underwent successful cardioversion the same day.  Followed up at cardiology clinic 3 days later and was again in A-fib with RVR and started on amiodarone 200 mg twice daily.  Patient was seen by PCP today for volume overload and found to be in persistent A-fib with RVR.  Sent to the ED for further evaluation.  Rate was in the 140s on arrival to the ED.  No fever or leukocytosis.  Creatinine 1.4, baseline 0.7-0.9.  Potassium 3.8.  Magnesium 1.6.  TSH 5.9.  High-sensitivity troponin negative x2.  BNP 663.  Chest x-ray showing new moderate left pleural effusion and mild perihilar interstitial edema.  Bedside echo showing new small to moderate pericardial effusion without signs of tamponade.  There was no pericardial effusion seen on TEE done on 01/09/2022.  Cardioverted in the ED today and converted to sinus rhythm. Patient was given IV Lasix 40 mg, oral potassium 40 mEq, and IV magnesium 2 g.  Patient states she has gained about 8 or 9 pounds in the past 2 weeks and her legs are swollen.  Reports increasing shortness of breath for the past 3 days, especially with exertion.  She is taking Lasix every other day.  States she was in A-fib and shocked a week ago and then shocked again in the emergency room today.  She now feels better.  Denies fevers, cough, or chest pain.  States she was seen in the emergency room a week ago for vomiting and abdominal pain but symptoms have now resolved and she feels much better and is able to eat but still feels constipated.  No other complaints.

## 2022-01-17 NOTE — ED Notes (Addendum)
Pt placed on 2L via [place] with CO2 monitor in place. BVM, suction with yankauer, and IV fluids set up at bedside. Pt currently on monitor resting comfortably.

## 2022-01-17 NOTE — ED Triage Notes (Signed)
Pt arrives POV for eval of SOB and palpitations. Pt reports hx of afib w/ cardioversion on Friday. States this AM noted sob and palp while showering, went to PCP who referred her here. States anticoagulated on eliquis

## 2022-01-17 NOTE — Assessment & Plan Note (Addendum)
-  Patient was seen in the ED a week ago for abdominal pain, nausea, vomiting, and constipation.   -CT abdomen pelvis done at that time showing no acute findings.   -Most of her symptoms have now resolved except continues to endorse constipation.   -Abdominal exam benign. -C/w MiraLAX 17 grams po Daily as needed and will make it BID and start Senna-Docusate 1 tab po BID -Received a suppository last night and she had a small bowel movement

## 2022-01-17 NOTE — Assessment & Plan Note (Addendum)
-  TSH 5.905 and Free T4 was 1.05 -Likely Subclinical Hypothyroidism   -Repeat Thyroid Function Tests in 4-6 weeks as an outpatient

## 2022-01-17 NOTE — Progress Notes (Signed)
Subjective:    Patient ID: Kristina Dougherty, female    DOB: [birth year], 76 y.o.   MRN: [id number]  Patient was recently admitted with atrial fibrillation and pulmonary edema in January.  She went back to the emergency room on February 13 and underwent cardioversion due to atrial fibrillation with rapid ventricular response.  She was seen in her cardiologist office on February 16 but was again in atrial fibrillation with a heart rate documented at 118 bpm.  She presents today having gained more than 8 pounds in the last week.  She has +2 pitting edema in both legs.  She reports shortness of breath.  Patient states that she has to fight to take of breath even while she is speaking.  Today she is in atrial fibrillation with rapid ventricular response.  I calculate her heart rate to be in the 130 bpm range.  She denies any chest pain but she reports shortness of breath despite taking metoprolol, amiodarone Past Medical History:  Diagnosis Date   Anxiety    Arthritis    Atrial fibrillation ([place])    Deaf, right    Depression    Hypertension    Osteopenia    Past Surgical History:  Procedure Laterality Date   ABDOMINAL HYSTERECTOMY     APPENDECTOMY     BUBBLE STUDY  01/09/2022   Procedure: BUBBLE STUDY;  Surgeon: [name], MD;  Location: [facility];  Service: Cardiovascular;;   CARDIOVERSION N/A 01/09/2022   Procedure: CARDIOVERSION;  Surgeon: [name], MD;  Location: [facility];  Service: Cardiovascular;  Laterality: N/A;   TEE WITHOUT CARDIOVERSION N/A 01/09/2022   Procedure: TRANSESOPHAGEAL ECHOCARDIOGRAM (TEE);  Surgeon: [name], MD;  Location: [facility] ENDOSCOPY;  Service: Cardiovascular;  Laterality: N/A;   Current Outpatient Medications on File Prior to Visit  Medication Sig Dispense Refill   acetaminophen (TYLENOL) 500 MG tablet Take 500 mg by mouth every 6 (six) hours as needed (pain.).     ALPRAZolam (XANAX) 0.5 MG tablet Take 1 tablet (0.5 mg total) by mouth  3 (three) times daily as needed for anxiety. TAKE 1 TABLET BY MOUTH THREE TIMES DAILY AS NEEDED FOR ANXIETY OR SLEEP (Patient taking differently: Take 0.25-0.5 mg by mouth 3 (three) times daily as needed for anxiety or sleep. TAKE 1 TABLET BY MOUTH THREE TIMES DAILY AS NEEDED FOR ANXIETY OR SLEEP) 30 tablet 0   amiodarone (PACERONE) 200 MG tablet Take 1 tablet by mouth twice a day for one month then reduce to 1 tablet daily 60 tablet 0   apixaban (ELIQUIS) 5 MG TABS tablet Take 1 tablet (5 mg total) by mouth 2 (two) times daily. 60 tablet 2   furosemide (LASIX) 40 MG tablet Take 1 tablet (40 mg total) by mouth daily. (Patient taking differently: Take 40 mg by mouth every other day.) 30 tablet 2   losartan (COZAAR) 50 MG tablet Take 50 mg by mouth daily.     metoprolol tartrate (LOPRESSOR) 50 MG tablet Take 1 tablet by mouth 2  times daily. 60 tablet 2   ondansetron (ZOFRAN) 4 MG tablet Take 1 tablet (4 mg total) by mouth every 6 (six) hours as needed for nausea. 20 tablet 0   PARoxetine (PAXIL) 20 MG tablet TAKE 1 TABLET BY MOUTH  TWICE DAILY 180 tablet 3   potassium chloride SA (KLOR-CON M) 20 MEQ tablet Take 1 tablet (20 mEq total) by mouth daily. 30 tablet 2   amLODipine (NORVASC) 10 MG tablet Take 10 mg  by mouth in the morning. (Patient not taking: Reported on 01/17/2022)     clobetasol cream (TEMOVATE) 9.73 % APPLY ONE APPLICATION TOPICALLY TWO TIMES DAILY (Patient taking differently: Apply 1 application topically See admin instructions. APPLY ONE APPLICATION TOPICALLY TWO TIMES DAILY prn) 30 g 3   hydrochlorothiazide (MICROZIDE) 12.5 MG capsule Take 12.5 mg by mouth daily. (Patient not taking: Reported on 01/17/2022)     melatonin 1 MG TABS tablet Take 1 mg by mouth at bedtime as needed (sleep). (Patient not taking: Reported on 01/12/2022)     meloxicam (MOBIC) 15 MG tablet Take 15 mg by mouth daily as needed for pain. (Patient not taking: Reported on 01/17/2022)     Multiple Vitamin (MULITIVITAMIN  WITH MINERALS) TABS Take 1 tablet by mouth daily. (Patient not taking: Reported on 01/12/2022)     pantoprazole (PROTONIX) 40 MG tablet TAKE 1 TABLET BY MOUTH  DAILY (Patient not taking: Reported on 01/12/2022) 90 tablet 3   No current facility-administered medications on file prior to visit.   Allergies  Allergen Reactions   Morphine And Related     Severe HA per pt   Nitrofurantoin Nausea And Vomiting   Social History   Socioeconomic History   Marital status: Divorced    Spouse name: Not on file   Number of children: Not on file   Years of education: Not on file   Highest education level: Not on file  Occupational History   Not on file  Tobacco Use   Smoking status: Every Day    Packs/day: 0.30    Types: Cigarettes    Last attempt to quit: 08/28/2011    Years since quitting: 10.3   Smokeless tobacco: Never  Substance and Sexual Activity   Alcohol use: No   Drug use: No   Sexual activity: Never  Other Topics Concern   Not on file  Social History Narrative   Not on file   Social Determinants of Health   Financial Resource Strain: Not on file  Food Insecurity: Not on file  Transportation Needs: Not on file  Physical Activity: Not on file  Stress: Not on file  Social Connections: Not on file  Intimate Partner Violence: Not on file     Review of Systems  Gastrointestinal:  Positive for constipation.  All other systems reviewed and are negative.     Objective:   Physical Exam Vitals reviewed.  Constitutional:      General: She is not in acute distress.    Appearance: Normal appearance. She is normal weight. She is not ill-appearing or toxic-appearing.  Neck:     Vascular: No carotid bruit.  Cardiovascular:     Rate and Rhythm: Tachycardia present. Rhythm irregular.     Pulses: Normal pulses.     Heart sounds: Normal heart sounds. No murmur heard.   No friction rub. No gallop.  Pulmonary:     Effort: Pulmonary effort is normal. Tachypnea present. No  respiratory distress.     Breath sounds: No stridor. Rales present. No wheezing or rhonchi.  Abdominal:     General: Abdomen is flat. Bowel sounds are normal.     Palpations: Abdomen is soft.  Musculoskeletal:     Right lower leg: Edema present.     Left lower leg: Edema present.  Neurological:     Mental Status: She is alert.          Assessment & Plan:  Persistent atrial fibrillation (HCC)  Acute pulmonary edema [facility]) Patient appears  fluid overloaded on exam today.  She has pitting edema in extremities.  Her weight is up almost 8 pounds since her last visit here.  She has crackles in her lungs consistent with pulmonary edema.  She is in atrial fibrillation with rapid ventricular response.  We discussed adding Cardizem and increasing furosemide as an outpatient however I was honest with the patient and that this may not be successful.  Patient elects to go back to the emergency room for rate control and diuresis.  She will go to [facility] emergency room.

## 2022-01-17 NOTE — ED Notes (Signed)
Consent form signed by pt, this RN, and Doren Custard, MD. Consent at bedside.

## 2022-01-18 ENCOUNTER — Inpatient Hospital Stay (HOSPITAL_COMMUNITY): Payer: Medicare Other

## 2022-01-18 DIAGNOSIS — I10 Essential (primary) hypertension: Secondary | ICD-10-CM

## 2022-01-18 DIAGNOSIS — I5021 Acute systolic (congestive) heart failure: Secondary | ICD-10-CM

## 2022-01-18 DIAGNOSIS — F341 Dysthymic disorder: Secondary | ICD-10-CM

## 2022-01-18 DIAGNOSIS — I3139 Other pericardial effusion (noninflammatory): Secondary | ICD-10-CM

## 2022-01-18 DIAGNOSIS — K2100 Gastro-esophageal reflux disease with esophagitis, without bleeding: Secondary | ICD-10-CM

## 2022-01-18 DIAGNOSIS — I4891 Unspecified atrial fibrillation: Secondary | ICD-10-CM

## 2022-01-18 DIAGNOSIS — E669 Obesity, unspecified: Secondary | ICD-10-CM

## 2022-01-18 DIAGNOSIS — R946 Abnormal results of thyroid function studies: Secondary | ICD-10-CM

## 2022-01-18 LAB — T4, FREE: Free T4: 1.05 ng/dL (ref 0.61–1.12)

## 2022-01-18 LAB — ECHOCARDIOGRAM COMPLETE
AR max vel: 1.87 cm2
AV Area VTI: 1.85 cm2
AV Area mean vel: 1.85 cm2
AV Mean grad: 1 mmHg
AV Peak grad: 1.9 mmHg
Ao pk vel: 0.7 m/s
Area-P 1/2: 5.13 cm2
Height: 62 in
S' Lateral: 3.06 cm
Weight: 2962.98 oz

## 2022-01-18 LAB — BASIC METABOLIC PANEL
Anion gap: 10 (ref 5–15)
BUN: 13 mg/dL (ref 8–23)
CO2: 30 mmol/L (ref 22–32)
Calcium: 8.6 mg/dL — ABNORMAL LOW (ref 8.9–10.3)
Chloride: 101 mmol/L (ref 98–111)
Creatinine, Ser: 1.22 mg/dL — ABNORMAL HIGH (ref 0.44–1.00)
GFR, Estimated: 46 mL/min — ABNORMAL LOW (ref >60)
Glucose, Bld: 117 mg/dL — ABNORMAL HIGH (ref 70–99)
Potassium: 3.6 mmol/L (ref 3.5–5.1)
Sodium: 141 mmol/L (ref 135–145)

## 2022-01-18 LAB — RESP PANEL BY RT-PCR (FLU A&B, COVID) ARPGX2
Influenza A by PCR: NEGATIVE
Influenza B by PCR: NEGATIVE
SARS Coronavirus 2 by RT PCR: NEGATIVE

## 2022-01-18 LAB — MAGNESIUM: Magnesium: 1.9 mg/dL (ref 1.7–2.4)

## 2022-01-18 MED ORDER — FUROSEMIDE 10 MG/ML IJ SOLN
40.0000 mg | Freq: Two times a day (BID) | INTRAMUSCULAR | Status: DC
  Administered 2022-01-18 – 2022-01-20 (×6): 40 mg via INTRAVENOUS
  Filled 2022-01-18 (×6): qty 4

## 2022-01-18 MED ORDER — MAGNESIUM SULFATE IN D5W 1-5 GM/100ML-% IV SOLN
1.0000 g | Freq: Once | INTRAVENOUS | Status: AC
  Administered 2022-01-18: 1 g via INTRAVENOUS
  Filled 2022-01-18: qty 100

## 2022-01-18 MED ORDER — AMIODARONE HCL 200 MG PO TABS: 200.0000 mg | ORAL_TABLET | Freq: Every day | ORAL | Status: DC

## 2022-01-18 MED ORDER — POTASSIUM CHLORIDE 20 MEQ PO PACK
40.0000 meq | PACK | Freq: Once | ORAL | Status: AC
  Administered 2022-01-18: 40 meq via ORAL
  Filled 2022-01-18: qty 2

## 2022-01-18 MED ORDER — AMIODARONE HCL IN DEXTROSE 360-4.14 MG/200ML-% IV SOLN
30.0000 mg/h | INTRAVENOUS | Status: DC
  Administered 2022-01-18 – 2022-01-21 (×7): 30 mg/h via INTRAVENOUS
  Filled 2022-01-18 (×7): qty 200

## 2022-01-18 MED ORDER — AMIODARONE HCL IN DEXTROSE 360-4.14 MG/200ML-% IV SOLN
60.0000 mg/h | INTRAVENOUS | Status: AC
  Administered 2022-01-18: 60 mg/h via INTRAVENOUS
  Filled 2022-01-18: qty 200

## 2022-01-18 MED ORDER — AMIODARONE LOAD VIA INFUSION
150.0000 mg | Freq: Once | INTRAVENOUS | Status: AC
  Administered 2022-01-18: 150 mg via INTRAVENOUS
  Filled 2022-01-18: qty 83.34

## 2022-01-18 MED ORDER — AMIODARONE HCL 200 MG PO TABS: 200.0000 mg | ORAL_TABLET | Freq: Two times a day (BID) | ORAL | Status: DC

## 2022-01-18 NOTE — Hospital Course (Addendum)
Patient is a 76 year old obese Caucasian female with a past medical history significant for but not limited to atrial fibrillation on anticoagulation with Eliquis, hypertension, anxiety and depression, GERD, tobacco abuse as well as other comorbidities who was diagnosed with new onset atrial fibrillation during her last hospitalization last month and was started on metoprolol 50 mg p.o. twice daily.  She was also started on anticoagulation at that time for CHA2DS2-VASc score of at least 4.  She was seen in the ED on 01/09/2022 for abdominal pain and found to be in A-fib with RVR and underwent successful cardioversion the same day.  Subsequently she followed up at the cardiology clinic 3 days later and was found to be in A-fib with RVR again and started on amiodarone 200 g p.o. twice daily.  She was seen by her PCP yesterday for volume overload and found to be in persistent A-fib with RVR.  She was subsequently then sent to the ED for further evaluation and rate was in the 140s on arrival to the ED.  She had no fever or leukocytosis but her creatinine was elevated at 1.4 up from a baseline of 0.7-0.9.  Her TSH was 5.9 her high sensitive troponins were negative x2.  BNP was elevated 663.  She had a bedside echocardiogram which showed a new small to moderate pericardial effusion without signs of tamponade.  There is no pericardial effusion seen on TTE on 2013 2023.  She was cardioverted in the ED and then converted to normal sinus rhythm and was started on IV diuresis but then subsequently went back to A-fib with RVR.  Cardiology was consulted formally and started the patient on amiodarone drip and recommended to continue diuresis given her volume overload.  She reported increasing shortness of breath last 3 days especially with exertion and she is taking Lasix every other day.  In the ED she was cardioverted back into normal sinus rhythm but then subsequently back.  Cardiology recommends continuing amiodarone drip and  transition to p.o. and then also recommends continuing diuresis with IV Lasix and further work-up for her volume overload.  She remains on the Amiodarone Drip but because of her heart uncontrolled cardiology to be bolusing her with IV amiodarone.  Cardiology is now planning a TEE/DCCV and this was done today.  Cardiology recommended continuing IV Lasix and following renal function carefully.  Cardiology has now transition her to p.o. Lasix.  I had a lengthy discussion about the patient's left-sided pleural effusion and she does not want to pursue a thoracentesis at this time.  She wants to ambulate first and see if she desaturates and then consider thoracentesis or may consider thoracentesis in outpatient setting.  Overnight her amiodarone had extravasation given that her IV infiltrated so cardiology was notified and they ordered the patient the hyaluronidase 150 units subcu.  Her respiratory status is improving but she states that she has not had a bowel movement and so days so we will add a suppository and if still no improvement will try an enema.  Patient had a bowel movement yesterday and she is improved.  Cardiology is recommending continuing diuresis with Lasix but have cut down the dose to 40 mg p.o. daily.  Patient did not desaturate on ambulatory home O2 screen and does not want a thoracentesis at this time.  We recommend the patient follow-up with her PCP and her cardiology care following this moderate left-sided pleural effusion and have a thoracentesis done in outpatient setting if necessary.  She did  not desaturate and she felt well and is ready to go home as she is medically stable.

## 2022-01-18 NOTE — ED Notes (Signed)
This RN reached out to attending hospitalist for target HR to transition to PO amiodarone , instructed to contact cardiology

## 2022-01-18 NOTE — Assessment & Plan Note (Signed)
-  Complicates overall prognosis and care -Estimated body mass index is 33.87 kg/m as calculated from the following:   Height as of this encounter: 5\' 2"  (1.575 m).   Weight as of this encounter: 84 kg.  -Weight Loss and Dietary Counseling given

## 2022-01-18 NOTE — Progress Notes (Signed)
Progress Note   Patient: Kristina Dougherty XNT:[id number] DOB: 01/26/1946 DOA: 01/17/2022     1 DOS: the patient was seen and examined on 01/18/2022   Brief hospital course: Patient is a 76 year old obese Caucasian female with a past medical history significant for but not limited to atrial fibrillation on anticoagulation with Eliquis, hypertension, anxiety and depression, GERD, tobacco abuse as well as other comorbidities who was diagnosed with new onset atrial fibrillation during her last hospitalization last month and was started on metoprolol 50 mg p.o. twice daily.  She was also started on anticoagulation at that time for CHA2DS2-VASc score of at least 4.  She was seen in the ED on 01/09/2022 for abdominal pain and found to be in A-fib with RVR and underwent successful cardioversion the same day.  Subsequently she followed up at the cardiology clinic 3 days later and was found to be in A-fib with RVR again and started on amiodarone 200 g p.o. twice daily.  She was seen by her PCP yesterday for volume overload and found to be in persistent A-fib with RVR.  She was subsequently then sent to the ED for further evaluation and rate was in the 140s on arrival to the ED.  She had no fever or leukocytosis but her creatinine was elevated at 1.4 up from a baseline of 0.7-0.9.  Her TSH was 5.9 her high sensitive troponins were negative x2.  BNP was elevated 663.  She had a bedside echocardiogram which showed a new small to moderate pericardial effusion without signs of tamponade.  There is no pericardial effusion seen on TTE on 2013 2023.  She was cardioverted in the ED and then converted to normal sinus rhythm and was started on IV diuresis but then subsequently went back to A-fib with RVR.  Cardiology was consulted formally and started the patient on amiodarone drip and recommended to continue diuresis given her volume overload.  She reported increasing shortness of breath last 3 days especially with exertion and  she is taking Lasix every other day.  In the ED she was cardioverted back into normal sinus rhythm but then subsequently back.  Cardiology recommends continuing amiodarone drip and transition to p.o. and then also recommends continuing diuresis with IV Lasix and further work-up for her volume overload.  Assessment and Plan: * Atrial fibrillation with rapid ventricular response (HCC)- (present on admission) -Cardioverted in the ED and went sinus rhythm but bradycardic with heart rate in the 40s to 50s but unfortunately went back into A Fib With RVR -Not hypotensive. -C/w Cardiac monitoring.   -Hold home metoprolol and Placed on Amiodarone gttt  With plans to transition to po per Cardiology recc's -C/w Anticoagulation with Apixaban 5 mg po BID  Obesity (BMI 96-75.9) -Complicates overall prognosis and care -Estimated body mass index is 33.87 kg/m as calculated from the following:   Height as of this encounter: 5\' 2"  (1.575 m).   Weight as of this encounter: 84 kg.  -Weight Loss and Dietary Counseling given   GERD (gastroesophageal reflux disease) -Continue Pantoprazole 40 mg po Daily   Constipation- (present on admission) -Patient was seen in the ED a week ago for abdominal pain, nausea, vomiting, and constipation.   -CT abdomen pelvis done at that time showing no acute findings.   -Most of her symptoms have now resolved except continues to endorse constipation.   -Abdominal exam benign. -C/w MiraLAX 17 grams po Daily as needed  Abnormal thyroid function test- (present on admission) -TSH 5.905 and  Free T4 was 1.05 -Likely Subclinical Hypothyroidism   -Repeat Thyroid Function Tests in 4-6 weeks as an outpatient  Hypomagnesemia- (present on admission) -Magnesium replaced again with IV Mag Sulfate 1 grams -Mag Level went from 1.6 -> 1.9 -Continue to Monitor and Replete as Necessary -Repeat Mag Level in the AM  AKI (acute kidney injury) ([place])- (present on admission) -Creatinine 1.42  on admission, Baseline 0.7-0.9. -IV Lasix given for volume overload.  -Improving as BUN/Cr is now 13/1.22 -Avoid nephrotoxic agents/ hold home hydrochlorothiazide and losartan.  Avoid contrast dyes, hypotension and renally dose medications -Continue to monitor renal function and urine output closely -Repeat CMP in a.m.  Acute systolic CHF (congestive heart failure) ([place])- (present on admission) -Likely secondary to uncontrolled A-fib.  BNP 663.   -Chest x-ray showing new moderate left pleural effusion and mild perihilar interstitial edema.   -TTE done 12/05/2021 showing EF 55 to 65%, diastolic parameters indeterminate, and mild mitral regurgitation.  -TEE done 01/09/2022 showing EF 60 to 65%.  ED physician had spoken to Dr. Harl Bowie from cardiology who recommended diuresis and repeat echocardiogram. -Repeat transthoracic echocardiogram ordered and done and showed "Left ventricular ejection fraction, by estimation, is 40 to 45%. The  left ventricle has mildly decreased function. The left ventricle demonstrates global hypokinesis. Left ventricular diastolic parameters are  Indeterminate." -Patient was given IV Lasix 40 mg in the ED.   -Cardiology has initiated on IV Lasix 40 mg twice daily now -Continue to monitor intake and output, daily weights.  -Patient is -1200 mL since admission; admission weight was 185.19 pounds but repeat is pending -Continue Low-sodium diet with fluid restriction. -Further Care per Cardiology   Pericardial effusion- (present on admission) -Informed by ED physician that bedside echo showing new small to moderate pericardial effusion without signs of tamponade.   -There was no pericardial effusion seen on TEE done on 01/09/2022.    -Hypothyroidism could be a possible etiology as TSH is elevated on labs at 5.905 -Routine transthoracic echocardiogram ordered for further evaluation and showed "Trivial pericardial effusion is present. The pericardial effusion is circumferential.  There is no evidence of cardiac tamponade." -Checking free T4 level and was 1.05  Essential hypertension- (present on admission) -Stable. -Lasix given for diuresis.   -Currently Hold hydrochlorothiazide and losartan given AKI. -Continue to Monitor BP per Protocol; Last BP was 114/93  ANXIETY DEPRESSION- (present on admission) -Continue Paroxetine 20 mg po BID, Alprazolam 0.25-0.5 mg po TID prn   Subjective: Seen and examined at bedside and states that she was still feeling a little short of breath.  No nausea or vomiting.  States that her heart rate has been relatively uncontrolled last few days.  Physical Exam: Vitals:   01/18/22 1300 01/18/22 1430 01/18/22 1507 01/18/22 1507  BP: 134/86 (!) 114/93 131/90   Pulse: (!) 118 87 77   Resp: 16 13 18    Temp:    98.7 F (37.1 C)  TempSrc:    Oral  SpO2: 94% 96% 95%   Weight:      Height:       Examination: Physical Exam:  Constitutional: WN/WD obese Caucasian female in NAD and appears calm and comfortable Respiratory: Diminished to auscultation bilaterally with coarse breath sounds, no wheezing, rales, rhonchi or crackles. Normal respiratory effort and patient is not tachypenic. No accessory muscle use. Unlabored breathing  Cardiovascular: RRR, no murmurs / rubs / gallops. S1 and S2 auscultated. 1+ LE Edema  Abdomen: Soft, non-tender, Distended 2/2 body habitus. Bowel sounds positive.  GU: Deferred. Musculoskeletal: No clubbing / cyanosis of digits/nails. No joint deformity upper and lower extremities.  Skin: No rashes, lesions, ulcers on a limited skin evaluation. No induration; Warm and dry.  Neurologic: CN 2-12 grossly intact with no focal deficits. Romberg sign and cerebellar reflexes not assessed.  Psychiatric: Normal judgment and insight. Alert and oriented x 3. Normal mood and appropriate affect.   Data Reviewed:  I have independently reviewed and interpreted the patient's BMP and thyroid studies as well as CBC  Family  Communication: No family currently at bedside  Disposition: Status is: Inpatient Remains inpatient appropriate because: She remains volume overloaded and needs cardiac clearance prior to safe discharge disposition as well as evaluation by PT and OT  Planned Discharge Destination:  Pending PT/OT Evaluation  VTE Prophylaxis: Anticoagulated with Apixaban 5 mg po BID  Author: Raiford Noble, DO [facility]  01/18/2022 3:20 PM  For on call review [email or web address].

## 2022-01-18 NOTE — TOC Progression Note (Addendum)
Transition of Care [facility]) - Progression Note    Patient Details  Name: Kristina Dougherty MRN: [id number] Date of Birth: 06/06/1946  [facility] CM/SW Contact  Zenon Mayo, RN Phone Number: 01/18/2022, 4:03 PM  Clinical Narrative:    From home alone, here with afib with RVR, pericardial effusion, AKI, CHF.  Conts on amio drip and iv lasix, was on eliquis pts. TOC will continue to follow for dc needs .        Expected Discharge Plan and Services                                                 Social Determinants of Health (SDOH) Interventions    Readmission Risk Interventions No flowsheet data found.

## 2022-01-18 NOTE — Progress Notes (Signed)
Echocardiogram 2D Echocardiogram has been performed.  Arlyss Gandy 01/18/2022, 8:41 AM

## 2022-01-18 NOTE — ED Notes (Signed)
Patient cardiac rhythm converted from sinus brady to a fib. Rate 107-116. Marlowe Sax, MD made aware.

## 2022-01-18 NOTE — ED Notes (Signed)
Verified with cardiology- Thukkani MD, target HR is <100 bpm at rest

## 2022-01-18 NOTE — Plan of Care (Signed)
  Problem: Activity: Goal: Capacity to carry out activities will improve Outcome: Progressing   Problem: Clinical Measurements: Goal: Respiratory complications will improve Outcome: Progressing   

## 2022-01-18 NOTE — Progress Notes (Signed)
Informed by RN that patient has converted to A-fib again and rate currently ranging from 110s to 120s.  Patient is asymptomatic.  Blood pressure 114/98. -Stat EKG -Discussed with Dr. Tereasa Coop from cardiology, he will see the patient.  Appreciate help.

## 2022-01-18 NOTE — Consult Note (Signed)
Cardiology Consultation:   Patient ID: KUSHI KUN MRN: [id number]; DOB: 1946-04-04  Admit date: 01/17/2022 Date of Consult: 01/18/2022  Primary Care Provider: Susy Frizzle, MD [facility] Cardiologist: None  [facility] Electrophysiologist:  None    Patient Profile:   Kristina Dougherty is a 76 y.o. female with a hx of A-fib on Eliquis, hypertension, anxiety, depression, GERD, tobacco use who is being seen today for the evaluation of atrial fibrillation and heart failure at the request of Dr Shela Leff.  History of Present Illness:   Ms. Winstanley was diagnosed with new onset atrial fibrillation with RVR during hospitalization from 1/8 to 1/10.  At that time she started on Eliquis and metoprolol titrate 50 mg twice daily.  She subsequently presented to the emergency department on 01/09/2022 with abdominal pain, and was again found to be in A-fib with RVR.  She was cardioverted same day.  She was seen in cardiology clinic 3 days later again in A-fib with RVR, and was started on amiodarone 200 mg twice daily.  She presented to her primary care doctor on 01/17/2022 with symptoms including increased SOB for 3 days, especially with exertion, weight gain. She has had new LE edema. Hasn't felt being in afib. No orthopnea/pnd.Again found to be in afib with RVR, and thus was sent to our ED. Found to have new AKI with Cr of 1.4 from bl 0.7-0.9. BNP 663 (no prior). Cardioversion was performed in the ED, subsequently converted back to sinus rhythm with subsequent bradycardia.   Has not missed any doses of apixaban.  Reports being told historically she has a "hole in her heart" not seen on our TTE.  Feeling much better after diuresis in the ED.  Hasn't seen cardiology for "several years" in outpatient setting.  Past Medical History:  Diagnosis Date   Anxiety    Arthritis    Atrial fibrillation ([place])    Deaf, right    Depression    Hypertension    Osteopenia     Past  Surgical History:  Procedure Laterality Date   ABDOMINAL HYSTERECTOMY     APPENDECTOMY     BUBBLE STUDY  01/09/2022   Procedure: BUBBLE STUDY;  Surgeon: [name], MD;  Location: [place];  Service: Cardiovascular;;   CARDIOVERSION N/A 01/09/2022   Procedure: CARDIOVERSION;  Surgeon: [name], MD;  Location: [place];  Service: Cardiovascular;  Laterality: N/A;   TEE WITHOUT CARDIOVERSION N/A 01/09/2022   Procedure: TRANSESOPHAGEAL ECHOCARDIOGRAM (TEE);  Surgeon: [name], MD;  Location: [place];  Service: Cardiovascular;  Laterality: N/A;     Home Medications:  Prior to Admission medications   Medication Sig Start Date End Date Taking? Authorizing Provider  acetaminophen (TYLENOL) 500 MG tablet Take 500 mg by mouth every 6 (six) hours as needed (pain.).   Yes [provider]  ALPRAZolam (XANAX) 0.5 MG tablet Take 1 tablet (0.5 mg total) by mouth 3 (three) times daily as needed for anxiety. TAKE 1 TABLET BY MOUTH THREE TIMES DAILY AS NEEDED FOR ANXIETY OR SLEEP Patient taking differently: Take 0.25-0.5 mg by mouth 3 (three) times daily as needed for anxiety or sleep. 12/16/21  Yes Susy Frizzle, MD  amiodarone (PACERONE) 200 MG tablet Take 1 tablet by mouth twice a day for one month then reduce to 1 tablet daily Patient taking differently: Take 200 mg by mouth 2 (two) times daily. 01/11/22  Yes Sherran Needs, NP  amLODipine (NORVASC) 10 MG tablet Take 10 mg by  mouth in the morning.   Yes [provider]  apixaban (ELIQUIS) 5 MG TABS tablet Take 1 tablet (5 mg total) by mouth 2 (two) times daily. 12/29/21 03/29/22 Yes Sherran Needs, NP  clobetasol cream (TEMOVATE) 9.48 % APPLY ONE APPLICATION TOPICALLY TWO TIMES DAILY Patient taking differently: Apply 1 application topically See admin instructions. APPLY ONE APPLICATION TOPICALLY TWO TIMES DAILY prn 03/03/19  Yes Susy Frizzle, MD  furosemide (LASIX) 40 MG tablet Take 1 tablet (40 mg  total) by mouth daily. Patient taking differently: Take 40 mg by mouth every other day. 12/29/21  Yes Sherran Needs, NP  hydrochlorothiazide (MICROZIDE) 12.5 MG capsule Take 12.5 mg by mouth daily.   Yes [provider]  losartan (COZAAR) 50 MG tablet Take 50 mg by mouth daily.   Yes [provider]  meloxicam (MOBIC) 15 MG tablet Take 15 mg by mouth daily as needed for pain.   Yes [provider]  metoprolol tartrate (LOPRESSOR) 50 MG tablet Take 1 tablet by mouth 2  times daily. 12/29/21 03/29/22 Yes Sherran Needs, NP  Multiple Vitamin (MULITIVITAMIN WITH MINERALS) TABS Take 1 tablet by mouth daily.   Yes [provider]  ondansetron (ZOFRAN) 4 MG tablet Take 1 tablet (4 mg total) by mouth every 6 (six) hours as needed for nausea. 5/46/27  Yes Delora Fuel, MD  pantoprazole (PROTONIX) 40 MG tablet TAKE 1 TABLET BY MOUTH  DAILY Patient taking differently: Take 40 mg by mouth daily. 08/08/21  Yes Susy Frizzle, MD  PARoxetine (PAXIL) 20 MG tablet TAKE 1 TABLET BY MOUTH  TWICE DAILY Patient taking differently: Take 20 mg by mouth in the morning and at bedtime. 08/08/21  Yes Susy Frizzle, MD  potassium chloride SA (KLOR-CON M) 20 MEQ tablet Take 1 tablet (20 mEq total) by mouth daily. 12/29/21  Yes Sherran Needs, NP    Inpatient Medications: Scheduled Meds:  amiodarone  200 mg Oral BID   apixaban  5 mg Oral BID   pantoprazole  40 mg Oral Daily   PARoxetine  20 mg Oral BID   Continuous Infusions:  PRN Meds: acetaminophen **OR** acetaminophen, ALPRAZolam, polyethylene glycol  Allergies:    Allergies  Allergen Reactions   Morphine And Related     Severe HA per pt   Nitrofurantoin Nausea And Vomiting    Social History:   Smoked cigarettes, average 3 ppd but quit 2 months ago. No alcohol. Social History   Socioeconomic History   Marital status: Divorced    Spouse name: Not on file   Number of children: Not on file   Years of education:  Not on file   Highest education level: Not on file  Occupational History   Not on file  Tobacco Use   Smoking status: Every Day    Packs/day: 0.30    Types: Cigarettes    Last attempt to quit: 08/28/2011    Years since quitting: 10.4   Smokeless tobacco: Never  Substance and Sexual Activity   Alcohol use: No   Drug use: No   Sexual activity: Never  Other Topics Concern   Not on file  Social History Narrative   Not on file   Social Determinants of Health   Financial Resource Strain: Not on file  Food Insecurity: Not on file  Transportation Needs: Not on file  Physical Activity: Not on file  Stress: Not on file  Social Connections: Not on file  Intimate Partner Violence: Not  on file    Family History:   Father died with CHF Family History  Problem Relation Age of Onset   Hypertension Maternal Grandmother      ROS:  Review of Systems: [y] = yes, [ ]  = no      General: Weight gain [y]; Weight loss [ ] ; Anorexia [ ] ; Fatigue [ ] ; Fever [ ] ; Chills [ ] ; Weakness [ ]    Cardiac: Chest pain/pressure [y]; Resting SOB [y]; Exertional SOB [ ] ; Orthopnea [ ] ; Pedal Edema [y]; Palpitations [ ] ; Syncope [ ] ; Presyncope [ ] ; Paroxysmal nocturnal dyspnea [ ]    Pulmonary: Cough [ ] ; Wheezing [ ] ; Hemoptysis [ ] ; Sputum [ ] ; Snoring [ ]    GI: Vomiting [ ] ; Dysphagia [ ] ; Melena [ ] ; Hematochezia [ ] ; Heartburn [ ] ; Abdominal pain [ ] ; Constipation [ ] ; Diarrhea [ ] ; BRBPR [ ]    GU: Hematuria [ ] ; Dysuria [ ] ; Nocturia [ ]  Vascular: Pain in legs with walking [ ] ; Pain in feet with lying flat [ ] ; Non-healing sores [ ] ; Stroke [ ] ; TIA [ ] ; Slurred speech [ ] ;   Neuro: Headaches [ ] ; Vertigo [ ] ; Seizures [ ] ; Paresthesias [ ] ;Blurred vision [ ] ; Diplopia [ ] ; Vision changes [ ]    Ortho/Skin: Arthritis [ ] ; Joint pain [ ] ; Muscle pain [ ] ; Joint swelling [ ] ; Back Pain [ ] ; Rash [ ]    Psych: Depression [ ] ; Anxiety [ ]    Heme: Bleeding problems [ ] ; Clotting disorders [ ] ; Anemia [ ]     Endocrine: Diabetes [ ] ; Thyroid dysfunction [ ]    Physical Exam/Data:   Vitals:   01/18/22 0115 01/18/22 0130 01/18/22 0145 01/18/22 0200  BP: 114/87 111/71 106/77 112/84  Pulse: (!) 55 (!) 54 (!) 53 93  Resp: 17 15 17 18   Temp:      TempSrc:      SpO2: 95% 94% 93% 93%  Weight:      Height:       No intake or output data in the 24 hours ending 01/18/22 0227 Last 3 Weights 01/17/2022 01/17/2022 01/12/2022  Weight (lbs) 185 lb 3 oz 187 lb 188 lb 3.2 oz  Weight (kg) 84 kg 84.823 kg 85.367 kg     Body mass index is 33.87 kg/m.  General:  Well nourished, well developed, in no acute distress HEENT: normal Lymph: no adenopathy Neck: JVD at 30 Degrees to the level of the mandible and lower earlobe Endocrine:  No thryomegaly Vascular: No carotid bruits; FA pulses 2+ bilaterally without bruits  Cardiac:  normal S1, S2; irregularly irregular rhythm Lungs:  clear to auscultation bilaterally, no wheezing, rhonchi or rales  Abd: soft, nontender, no hepatomegaly  Ext: 2+ LE edema. Musculoskeletal:  No deformities, BUE and BLE strength normal and equal Skin: warm and dry  Neuro:  CNs 2-12 intact, no focal abnormalities noted Psych:  Normal affect   EKG:  The EKG was personally reviewed and demonstrates:  afib with RVR rate of 112 Telemetry:  Telemetry was personally reviewed and demonstrates:  afib with RVR -> cardioversion -> sinus brady -> afib with RVR  Relevant CV Studies:  TTE (12/05/21): IMPRESSIONS   1. Left ventricular ejection fraction, by estimation, is 55 to 60%. The  left ventricle has normal function. The left ventricle has no regional  wall motion abnormalities. Left ventricular diastolic parameters are  indeterminate.   2. Right ventricular systolic function is normal. The right ventricular  size is normal. There is normal pulmonary artery systolic pressure. The  estimated right ventricular systolic pressure is 24.0 mmHg.   3. The mitral valve is normal in structure.  Mild mitral valve  regurgitation. No evidence of mitral stenosis.   4. The aortic valve is tricuspid. Aortic valve regurgitation is not  visualized. No aortic stenosis is present.   5. The inferior vena cava is normal in size with greater than 50%  respiratory variability, suggesting right atrial pressure of 3 mmHg.   6. Cannot exclude a small PFO.   LA size in normal.  Laboratory Data:  High Sensitivity Troponin:   Recent Labs  Lab 01/17/22 1536 01/17/22 1855  TROPONINIHS 12 14     Chemistry Recent Labs  Lab 01/17/22 1536  NA 139  K 3.8  CL 101  CO2 24  GLUCOSE 120*  BUN 16  CREATININE 1.42*  CALCIUM 9.0  GFRNONAA 38*  ANIONGAP 14    No results for input(s): PROT, ALBUMIN, AST, ALT, ALKPHOS, BILITOT in the last 168 hours. Hematology Recent Labs  Lab 01/17/22 1536  WBC 9.5  RBC 5.51*  HGB 15.0  HCT 47.1*  MCV 85.5  MCH 27.2  MCHC 31.8  RDW 13.3  PLT 242   BNP Recent Labs  Lab 01/17/22 1537  BNP 663.7*    DDimer No results for input(s): DDIMER in the last 168 hours.  Radiology/Studies:  DG Chest Port 1 View  Result Date: 01/17/2022 CLINICAL DATA:  SOB and palpitations today. Pt had a-fib with cardioversion on Friday. Hx of HTN, A-fib. Shortness of breath, palpitations EXAM: PORTABLE CHEST - 1 VIEW COMPARISON:  12/04/2021 FINDINGS: New moderate left pleural effusion. Atelectasis/consolidation at the left lung base. Mild perihilar interstitial edema suspected. Heart size upper limits normal for technique. Aortic Atherosclerosis (ICD10-170.0). No pneumothorax. Visualized bones unremarkable. IMPRESSION: 1. New moderate left pleural effusion. 2. Suspect mild perihilar interstitial edema Electronically Signed   By: Lucrezia Europe M.D.   On: 01/17/2022 15:49   {   Assessment and Plan:   Atrial Fibrillation with RVR Her A-fib was diagnosed this January 2023, when she is admitted from 1/8-1/10, started on apixaban and Toprol all.  Has had recurrence twice now,  status post 2 unsuccessful cardioversions on 01/09/2022 and 01/17/2022.  Started on amiodarone in the outpatient setting with oral load.  Today she appears volume overloaded with AKI with suspected cardiorenal physiology and elevated BNP.  Was cardioverted here in the ED, however quickly went back into atrial fibrillation, and suspect that volume overload was a contributor to her inability to stay in sinus rhythm.  There were no other obvious etiologies for her A-fib at this time.  But after reloading with amiodarone and aggressive diuresis, cardioversion can be reattempted with more permanent success.  She has not missed any doses of her apixaban since initiation January. -Reload amiodarone to 5 g with IV to p.o. load -Continue holding home metoprolol tartrate while receiving initial IV amiodarone load, when restarting plan to convert to metoprolol succinate for improved pharmacodynamics and decreased trough levels. Potential restart 2/22 during day pending tele heart rates -Continue home apixaban -Aggressive diuresis, likely cardioversion later in the week once dry and euvolemic  2.   Pericardial Effusion New pericardial effusion seen by emergency department physician at bedside POCUS which is developed since 12/05/2021 with the last echo.  Unclear source.  No signs or symptoms of tamponade at this time.  Needs further evaluation - Recommend TTE in am for formal  qualification   3.  Volume overload Etiology is unclear.  Echo with normal ejection fraction, and no evidence of diastolic dysfunction or LVH.  RVSP is normal, point away from pulmonary hypertension.  Her last urinalysis did not have proteinuria, making renal etiologies less likely.  No evidence of cirrhosis on CT abdomen's.   -Suspect volume overload may be from uncontrolled A-fib, however should be worked up and noncardiac diagnosis sees should be evaluated and excluded as well.  For questions or updates, please contact [place] Please  consult [email or web address] for contact info under   Signed, Bronson Curb, MD  01/18/2022 2:27 AM

## 2022-01-19 ENCOUNTER — Inpatient Hospital Stay (HOSPITAL_COMMUNITY): Payer: Medicare Other

## 2022-01-19 ENCOUNTER — Other Ambulatory Visit (HOSPITAL_COMMUNITY): Payer: Self-pay

## 2022-01-19 DIAGNOSIS — I509 Heart failure, unspecified: Secondary | ICD-10-CM

## 2022-01-19 DIAGNOSIS — N179 Acute kidney failure, unspecified: Secondary | ICD-10-CM

## 2022-01-19 DIAGNOSIS — E876 Hypokalemia: Secondary | ICD-10-CM

## 2022-01-19 LAB — CBC WITH DIFFERENTIAL/PLATELET
Abs Immature Granulocytes: 0.02 10*3/uL (ref 0.00–0.07)
Basophils Absolute: 0 10*3/uL (ref 0.0–0.1)
Basophils Relative: 1 %
Eosinophils Absolute: 0.2 10*3/uL (ref 0.0–0.5)
Eosinophils Relative: 2 %
HCT: 39.9 % (ref 36.0–46.0)
Hemoglobin: 13.5 g/dL (ref 12.0–15.0)
Immature Granulocytes: 0 %
Lymphocytes Relative: 24 %
Lymphs Abs: 1.9 10*3/uL (ref 0.7–4.0)
MCH: 28 pg (ref 26.0–34.0)
MCHC: 33.8 g/dL (ref 30.0–36.0)
MCV: 82.6 fL (ref 80.0–100.0)
Monocytes Absolute: 0.8 10*3/uL (ref 0.1–1.0)
Monocytes Relative: 10 %
Neutro Abs: 5 10*3/uL (ref 1.7–7.7)
Neutrophils Relative %: 63 %
Platelets: 164 10*3/uL (ref 150–400)
RBC: 4.83 MIL/uL (ref 3.87–5.11)
RDW: 13.6 % (ref 11.5–15.5)
WBC: 7.8 10*3/uL (ref 4.0–10.5)
nRBC: 0 % (ref 0.0–0.2)

## 2022-01-19 LAB — BASIC METABOLIC PANEL
Anion gap: 12 (ref 5–15)
BUN: 9 mg/dL (ref 8–23)
CO2: 31 mmol/L (ref 22–32)
Calcium: 8.8 mg/dL — ABNORMAL LOW (ref 8.9–10.3)
Chloride: 94 mmol/L — ABNORMAL LOW (ref 98–111)
Creatinine, Ser: 1.28 mg/dL — ABNORMAL HIGH (ref 0.44–1.00)
GFR, Estimated: 43 mL/min — ABNORMAL LOW (ref >60)
Glucose, Bld: 153 mg/dL — ABNORMAL HIGH (ref 70–99)
Potassium: 3.7 mmol/L (ref 3.5–5.1)
Sodium: 137 mmol/L (ref 135–145)

## 2022-01-19 LAB — COMPREHENSIVE METABOLIC PANEL
ALT: 21 U/L (ref 0–44)
AST: 23 U/L (ref 15–41)
Albumin: 3 g/dL — ABNORMAL LOW (ref 3.5–5.0)
Alkaline Phosphatase: 47 U/L (ref 38–126)
Anion gap: 10 (ref 5–15)
BUN: 11 mg/dL (ref 8–23)
CO2: 32 mmol/L (ref 22–32)
Calcium: 8.4 mg/dL — ABNORMAL LOW (ref 8.9–10.3)
Chloride: 98 mmol/L (ref 98–111)
Creatinine, Ser: 1.26 mg/dL — ABNORMAL HIGH (ref 0.44–1.00)
GFR, Estimated: 44 mL/min — ABNORMAL LOW (ref >60)
Glucose, Bld: 96 mg/dL (ref 70–99)
Potassium: 3.3 mmol/L — ABNORMAL LOW (ref 3.5–5.1)
Sodium: 140 mmol/L (ref 135–145)
Total Bilirubin: 0.6 mg/dL (ref 0.3–1.2)
Total Protein: 5.1 g/dL — ABNORMAL LOW (ref 6.5–8.1)

## 2022-01-19 LAB — MAGNESIUM
Magnesium: 1.7 mg/dL (ref 1.7–2.4)
Magnesium: 1.7 mg/dL (ref 1.7–2.4)

## 2022-01-19 LAB — BRAIN NATRIURETIC PEPTIDE: B Natriuretic Peptide: 263.3 pg/mL — ABNORMAL HIGH (ref 0.0–100.0)

## 2022-01-19 LAB — PHOSPHORUS: Phosphorus: 3 mg/dL (ref 2.5–4.6)

## 2022-01-19 MED ORDER — POTASSIUM CHLORIDE CRYS ER 20 MEQ PO TBCR
40.0000 meq | EXTENDED_RELEASE_TABLET | Freq: Two times a day (BID) | ORAL | Status: DC
  Administered 2022-01-19: 40 meq via ORAL
  Filled 2022-01-19: qty 2

## 2022-01-19 MED ORDER — MAGNESIUM SULFATE 2 GM/50ML IV SOLN
2.0000 g | Freq: Once | INTRAVENOUS | Status: AC
  Administered 2022-01-19: 2 g via INTRAVENOUS
  Filled 2022-01-19: qty 50

## 2022-01-19 MED ORDER — ALUM & MAG HYDROXIDE-SIMETH 200-200-20 MG/5ML PO SUSP
15.0000 mL | Freq: Four times a day (QID) | ORAL | Status: DC | PRN
  Administered 2022-01-19: 16:00:00 15 mL via ORAL
  Filled 2022-01-19: qty 30

## 2022-01-19 MED ORDER — METOPROLOL TARTRATE 5 MG/5ML IV SOLN
2.5000 mg | Freq: Once | INTRAVENOUS | Status: AC
  Administered 2022-01-19: 2.5 mg via INTRAVENOUS
  Filled 2022-01-19: qty 5

## 2022-01-19 MED ORDER — SODIUM CHLORIDE 0.9 % IV SOLN: INTRAVENOUS | Status: DC

## 2022-01-19 NOTE — TOC Progression Note (Signed)
Transition of Care [facility]) - Progression Note    Patient Details  Name: Kristina Dougherty MRN: [id number] Date of Birth: Nov 14, 1946  [facility]) CM/SW Contact  Zenon Mayo, RN Phone Number: 01/19/2022, 4:09 PM  Clinical Narrative:    From home alone, here with afib with RVR, pericardial effusion, AKI, CHF.  Conts on amio drip and iv lasix, was on eliquis pta. She is indep. She has a scale at home and a bp cuff. She weighs herself everyother day, checks her bp twice a week.  She eats very little salt, no processed meat, sometimes may eat a little bacon. She states at discharge he son or her good friend Butch Penny will transport her home.  Conts on ami drip, replete mag, iv lasix and for cxr today. TOC will continue to follow for dc needs.   Expected Discharge Plan: Home/Self Care Barriers to Discharge: Continued Medical Work up  Expected Discharge Plan and Services Expected Discharge Plan: Home/Self Care   Discharge Planning Services: CM Consult Post Acute Care Choice: NA Living arrangements for the past 2 months: Single Family Home                   DME Agency: NA       HH Arranged: NA           Social Determinants of Health (SDOH) Interventions    Readmission Risk Interventions No flowsheet data found.

## 2022-01-19 NOTE — Progress Notes (Signed)
HOSPITAL MEDICINE OVERNIGHT EVENT NOTE    Notified by nursing that patient has been complaining of increasing abdominal distention and discomfort.  Of note, patient has also been suffering from persisting rapid atrial fibrillation, currently on amiodarone infusion but despite this heart rates are in excess of 150 bpm.  Nursing reports the patient denies chest pain, denies shortness of breath and is hemodynamically stable.  We will obtain repeat chemistry with magnesium and to ensure that electrolytes are at target.  Furthermore, will give a one-time dose of intravenous metoprolol and reassess heart rate and symptoms.  [name]  MD [facility]   ADDENDUM (2/23 10:55pm)  Heart rate has come down from the 150s to the 110s associated with substantial improvement in patient's abdominal discomfort.  Electrolytes are still pending, will follow up and replace as necessary.  Continue to monitor closely.  [name] [name]

## 2022-01-19 NOTE — Assessment & Plan Note (Addendum)
-  Noted to have a Left Pleural Effusion on Admission CXR -C/w Diuresis per Cardiology and may need a Thoracentesis; she remained on IV 40 twice daily and cardiology has not changed her to 40 mg p.o. twice daily -Repeat CXR today showed "Cardiomegaly. Increased density in the left lower lung fields has not changed significantly suggesting pleural effusion and underlying atelectasis/pneumonia." -Currently not Hypoxic and feels as if her SOB is improving  -**The patient will need repeat chest x-ray in 3 to 6 weeks and have this moderate pleural effusion followed and have a thoracentesis done for further evaluation in outpatient setting with fluid analysis given that she did not want it done here

## 2022-01-19 NOTE — Progress Notes (Signed)
Progress Note   Patient: Kristina Dougherty:[id number] DOB: July 08, 1946 DOA: 01/17/2022     2 DOS: the patient was seen and examined on 01/19/2022   Brief hospital course: Patient is a 76 year old obese Caucasian female with a past medical history significant for but not limited to atrial fibrillation on anticoagulation with Eliquis, hypertension, anxiety and depression, GERD, tobacco abuse as well as other comorbidities who was diagnosed with new onset atrial fibrillation during her last hospitalization last month and was started on metoprolol 50 mg p.o. twice daily.  She was also started on anticoagulation at that time for CHA2DS2-VASc score of at least 4.  She was seen in the ED on 01/09/2022 for abdominal pain and found to be in A-fib with RVR and underwent successful cardioversion the same day.  Subsequently she followed up at the cardiology clinic 3 days later and was found to be in A-fib with RVR again and started on amiodarone 200 g p.o. twice daily.  She was seen by her PCP yesterday for volume overload and found to be in persistent A-fib with RVR.  She was subsequently then sent to the ED for further evaluation and rate was in the 140s on arrival to the ED.  She had no fever or leukocytosis but her creatinine was elevated at 1.4 up from a baseline of 0.7-0.9.  Her TSH was 5.9 her high sensitive troponins were negative x2.  BNP was elevated 663.  She had a bedside echocardiogram which showed a new small to moderate pericardial effusion without signs of tamponade.  There is no pericardial effusion seen on TTE on 2013 2023.  She was cardioverted in the ED and then converted to normal sinus rhythm and was started on IV diuresis but then subsequently went back to A-fib with RVR.  Cardiology was consulted formally and started the patient on amiodarone drip and recommended to continue diuresis given her volume overload.  She reported increasing shortness of breath last 3 days especially with exertion and  she is taking Lasix every other day.  In the ED she was cardioverted back into normal sinus rhythm but then subsequently back.  Cardiology recommends continuing amiodarone drip and transition to p.o. and then also recommends continuing diuresis with IV Lasix and further work-up for her volume overload.  She remains on the amiodarone drip but because of her heart uncontrolled cardiology to be bolusing her with IV amiodarone.  Cardiology is now planning a TEE/DCCV tomorrow and will be making the patient n.p.o. after midnight.  Cardiology recommends continuing IV Lasix today and following renal function carefully.  Assessment and Plan: * Atrial fibrillation with rapid ventricular response (HCC)- (present on admission) -Cardioverted in the ED and went sinus rhythm but bradycardic with heart rate in the 40s to 50s but unfortunately went back into A Fib With RVR -Not hypotensive. -C/w Cardiac monitoring.   -Hold home metoprolol and Placed on Amiodarone gtt  With plans to transition to po per Cardiology recc's but given her Uncontrolled rates she is getting rebolused with [facility] -Cardiology is now planning for a TEE/DCCV in the morning -C/w Anticoagulation with Apixaban 5 mg po BID  Pleural effusion due to CHF (congestive heart failure) ([place]) -Noted to have a Left Pleural Effusion on Admission CXR -Repeat CXR today showed "Persistent LEFT basilar consolidation and moderate LEFT pleural effusion." -C/w Diuresis per Cardiology and may need a Thoracentesis  -Repeat CXR in the AM -Currently not Hypoxic and feels as if her SOB is improving   Obesity (  BMI 36-64.4) -Complicates overall prognosis and care -Estimated body mass index is 33.87 kg/m as calculated from the following:   Height as of this encounter: 5\' 2"  (1.575 m).   Weight as of this encounter: 84 kg.  -Weight Loss and Dietary Counseling given   GERD (gastroesophageal reflux disease) -Continue Pantoprazole 40 mg po Daily   Constipation-  (present on admission) -Patient was seen in the ED a week ago for abdominal pain, nausea, vomiting, and constipation.   -CT abdomen pelvis done at that time showing no acute findings.   -Most of her symptoms have now resolved except continues to endorse constipation.   -Abdominal exam benign. -C/w MiraLAX 17 grams po Daily as needed  Abnormal thyroid function test- (present on admission) -TSH 5.905 and Free T4 was 1.05 -Likely Subclinical Hypothyroidism   -Repeat Thyroid Function Tests in 4-6 weeks as an outpatient  Hypomagnesemia- (present on admission) -Magnesium replaced again with IV Mag Sulfate 1 grams -Mag Level went from 1.6 -> 1.9 -> 1.7 -We will replete with IV mag sulfate 2 g -Continue to Monitor and Replete as Necessary -Repeat Mag Level in the AM  AKI (acute kidney injury) ([place])- (present on admission) -Creatinine 1.42 on admission and BUN/creatinine improved to 13/1.22 yesterday but slightly bumped today and is 11/1.26, Baseline 0.7-0.9. -Continuing IV Lasix given for volume overload.  -BUN/creatinine is relatively stable from yesterday -Avoid nephrotoxic agents/ hold home hydrochlorothiazide and losartan.  Avoid contrast dyes, hypotension and renally dose medications -Continue to monitor renal function and urine output closely -Repeat CMP in a.m.  Acute systolic CHF (congestive heart failure) ([place])- (present on admission) -Likely secondary to uncontrolled A-fib.  BNP 663.   -Chest x-ray showing new moderate left pleural effusion and mild perihilar interstitial edema.   -TTE done 12/05/2021 showing EF 55 to 03%, diastolic parameters indeterminate, and mild mitral regurgitation.  -TEE done 01/09/2022 showing EF 60 to 65%.  ED physician had spoken to Dr. Harl Bowie from cardiology who recommended diuresis and repeat echocardiogram. -Repeat transthoracic echocardiogram ordered and done and showed "Left ventricular ejection fraction, by estimation, is 40 to 45%. The  left ventricle  has mildly decreased function. The left ventricle demonstrates global hypokinesis. Left ventricular diastolic parameters are  Indeterminate." -Patient was given IV Lasix 40 mg in the ED.   -Cardiology has initiated on IV Lasix 40 mg twice daily now and will continue for today -Continue to monitor intake and output, daily weights.  -Patient is -2,795 mL since admission; admission weight was 185.19 pounds but repeat is pending -Continue Low-sodium diet with fluid restriction. -Further Care per Cardiology   Pericardial effusion- (present on admission) -Informed by ED physician that bedside echo showing new small to moderate pericardial effusion without signs of tamponade.   -There was no pericardial effusion seen on TEE done on 01/09/2022.    -Hypothyroidism could be a possible etiology as TSH is elevated on labs at 5.905 -Routine transthoracic echocardiogram ordered for further evaluation and showed "Trivial pericardial effusion is present. The pericardial effusion is circumferential. There is no evidence of cardiac tamponade." -Checking free T4 level and was 1.05 -Currently no signs of tamponade  Hypokalemia -Mild at 3.3 and in the setting of diuresis -Replete with po KCl 40 mEQ BID x2 -Continue to Monitor and Replete as Necessary -Repeat CMP in the AM   Essential hypertension- (present on admission) -Stable. -Lasix given for diuresis.   -Currently Hold hydrochlorothiazide and losartan given AKI. -Continue to Monitor BP per Protocol; Last BP was  132/93  ANXIETY DEPRESSION- (present on admission) -Continue Paroxetine 20 mg po BID, Alprazolam 0.25-0.5 mg po TID prn  Subjective: Seen and examined at bedside and she states she is doing a bit better.  States that she is not as short of breath and states that it is easier for her to breathe.  Has been urinating quite frequently.  Denies any nausea or vomiting.  No other concerns or complaints at this time.  Physical Exam: Vitals:    01/19/22 0940 01/19/22 0950 01/19/22 1030 01/19/22 1145  BP:   96/72 (!) 132/93  Pulse:      Resp: 15 17 18 20   Temp:    97.8 F (36.6 C)  TempSrc:    Oral  SpO2:    93%  Weight:      Height:       Examination: Physical Exam:  Constitutional: WN/WD obese  Caucasian female in NAD and appears calm and comfortable Respiratory: Diminished to auscultation bilaterally with coarse breath sounds worse on the Left compared to the Right with some slight crackles, no wheezing, rales, rhonchi. Normal respiratory effort and patient is not tachypenic. No accessory muscle use.  Cardiovascular: Irregularly Irregular and Tachycardic, 1+ LE extremity edema. Abdomen: Soft, non-tender, Distended 2/2 body habitus. Bowel sounds positive.  GU: Deferred. Musculoskeletal: No clubbing / cyanosis of digits/nails. No joint deformity upper and lower extremities.  Skin: No rashes, lesions, ulcers on a limited skin evaluation. No induration; Warm and dry.  Neurologic: CN 2-12 grossly intact with no focal deficits.  Data Reviewed:  I have independently reviewed and interpreted the patient's laboratory data including her CMP and CBC as well as her chest x-ray  Patient's potassium is now 3.3 and her BUN/creatinine is 11/1.26.  Albumin is 3.0 and chest x-ray shows persistent left-sided moderate pleural effusion  Family Communication: No family currently at bedside  Disposition: Status is: Inpatient Remains inpatient appropriate because: She will be undergoing a TEE/DCCV in the morning by cardiology  Planned Discharge Destination:  Pending PT and OT evaluation as well as cardiac clearance  VTE Prophylaxis: Apixaban 5 mg po BID   Author: Raiford Noble, DO [facility]  01/19/2022 12:22 PM  For on call review [email or web address].

## 2022-01-19 NOTE — Progress Notes (Signed)
°   01/19/22 0807  Vitals  Temp 98 F (36.7 C)  Temp Source Oral  BP 109/75  MAP (mmHg) 86  BP Location Right Arm  BP Method Automatic  Patient Position (if appropriate) Lying  Pulse Rate Source Monitor  ECG Heart Rate (!) 119  Resp 19  MEWS COLOR  MEWS Score Color Yellow  Pain Assessment  Pain Scale 0-10  Pain Score 0  MEWS Score  MEWS Temp 0  MEWS Systolic 0  MEWS Pulse 2  MEWS RR 0  MEWS LOC 0  MEWS Score 2   Patient became a yellow MEWS due to elevated HR caused by A-Fib. Patient is currently on amiodarone gtt going at 16.7. Cardiology is consulted and is aware. Yellow MEWS protocol initiated.

## 2022-01-19 NOTE — Progress Notes (Addendum)
Patient complained about stomach feeling tight and it's getting worse. She also states that she may be constipated. Upon palpation, stomach is a little tight. Provider notified about stomach and increased BP/HR.

## 2022-01-19 NOTE — Plan of Care (Signed)
°  Problem: Education: Goal: Ability to demonstrate management of disease process will improve Outcome: Progressing   Problem: Education: Goal: Ability to verbalize understanding of medication therapies will improve Outcome: Progressing   Problem: Cardiac: Goal: Ability to achieve and maintain adequate cardiopulmonary perfusion will improve Outcome: Progressing   Problem: Activity: Goal: Capacity to carry out activities will improve Outcome: Progressing   Problem: Clinical Measurements: Goal: Cardiovascular complication will be avoided Outcome: Progressing   Problem: Coping: Goal: Level of anxiety will decrease Outcome: Progressing

## 2022-01-19 NOTE — Progress Notes (Addendum)
Pt is scheduled for a TEE-guided cardioversion tomorrow 01/20/22 at 1300 with Dr. Radford Pax. NPO at MN please.   Ledora Bottcher, PA-C 01/19/2022, 10:56 AM [place], [place] [id number]

## 2022-01-19 NOTE — Progress Notes (Signed)
Patient is alert and oriented x 4. RN went over the informed consent to the patient at the bedside and patient verbalize understanding of the procedure that is scheduled tomorrow and has no questions for RN to address. Patient signed consent and RN placed informed consent in the patients chart.

## 2022-01-19 NOTE — Assessment & Plan Note (Addendum)
-  K+ is now 4.5 -Continue to Monitor and Replete as Necessary -Repeat CMP in the AM

## 2022-01-19 NOTE — Progress Notes (Addendum)
Progress Note  Patient Name: Kristina Dougherty Date of Encounter: 01/19/2022  [facility] Cardiologist: None NEW  Subjective   Denies any chest pain.  SOB has improved significantly. Admitted with afib with RVR and CHF.  She put out 3L yesterday and is net neg 1.7L since admit  Inpatient Medications    Scheduled Meds:  apixaban  5 mg Oral BID   furosemide  40 mg Intravenous BID   pantoprazole  40 mg Oral Daily   PARoxetine  20 mg Oral BID   potassium chloride  40 mEq Oral BID   Continuous Infusions:  amiodarone 30 mg/hr (01/19/22 0341)   PRN Meds: acetaminophen **OR** acetaminophen, ALPRAZolam, polyethylene glycol   Vital Signs    Vitals:   01/19/22 0011 01/19/22 0349 01/19/22 0758 01/19/22 0807  BP:  112/75 111/81 109/75  Pulse:  95    Resp:  16 18 19   Temp:  97.8 F (36.6 C) 98.1 F (36.7 C) 98 F (36.7 C)  TempSrc:  Oral Oral Oral  SpO2:  92% 93%   Weight: 81.8 kg     Height:        Intake/Output Summary (Last 24 hours) at 01/19/2022 1013 Last data filed at 01/19/2022 6160 Gross per 24 hour  Intake 1354.14 ml  Output 3050 ml  Net -1695.86 ml   Last 3 Weights 01/19/2022 01/18/2022 01/17/2022  Weight (lbs) 180 lb 6.4 oz 182 lb 5.1 oz 185 lb 3 oz  Weight (kg) 81.829 kg 82.7 kg 84 kg      Telemetry    Atrial fibrillation with RVR - Personally Reviewed  ECG    No new EKG to review - Personally Reviewed  Physical Exam   GEN: No acute distress.   Neck: No JVD Cardiac: irregularly irregular and tachycardic with no murmurs, rubs, or gallops.  Respiratory: crackles at bases bilaterally GI: Soft, nontender, non-distended  MS: No edema; No deformity. Neuro:  Nonfocal  Psych: Normal affect   Labs    High Sensitivity Troponin:   Recent Labs  Lab 01/17/22 1536 01/17/22 1855  TROPONINIHS 12 14      Chemistry Recent Labs  Lab 01/17/22 1536 01/18/22 0155 01/19/22 0445  NA 139 141 140  K 3.8 3.6 3.3*  CL 101 101 98  CO2 24 30 32  GLUCOSE  120* 117* 96  BUN 16 13 11   CREATININE 1.42* 1.22* 1.26*  CALCIUM 9.0 8.6* 8.4*  PROT  --   --  5.1*  ALBUMIN  --   --  3.0*  AST  --   --  23  ALT  --   --  21  ALKPHOS  --   --  47  BILITOT  --   --  0.6  GFRNONAA 38* 46* 44*  ANIONGAP 14 10 10      Hematology Recent Labs  Lab 01/17/22 1536 01/19/22 0445  WBC 9.5 7.8  RBC 5.51* 4.83  HGB 15.0 13.5  HCT 47.1* 39.9  MCV 85.5 82.6  MCH 27.2 28.0  MCHC 31.8 33.8  RDW 13.3 13.6  PLT 242 164    BNP Recent Labs  Lab 01/17/22 1537  BNP 663.7*     DDimer No results for input(s): DDIMER in the last 168 hours.   CHA2DS2-VASc Score = 4  This indicates a 4.8% annual risk of stroke. The patient's score is based upon: CHF History: 0 HTN History: 1 Diabetes History: 0 Stroke History: 0 Vascular Disease History: 0 Age Score: 2 Gender  Score: 1   Radiology    DG CHEST PORT 1 VIEW  Result Date: 01/19/2022 CLINICAL DATA:  Shortness of breath in a 76 year old female. EXAM: PORTABLE CHEST 1 VIEW COMPARISON:  February 21st of 2023. FINDINGS: EKG leads project over the chest. Persistent LEFT basilar consolidation and pleural effusion obscures LEFT heart border. Cardiomediastinal contours and hilar structures, visualized portions are stable. RIGHT lung is clear.  No sign of pneumothorax. On limited assessment there is no acute skeletal process. IMPRESSION: Persistent LEFT basilar consolidation and moderate LEFT pleural effusion. Electronically Signed   By: Zetta Bills M.D.   On: 01/19/2022 08:06   DG Chest Port 1 View  Result Date: 01/17/2022 CLINICAL DATA:  SOB and palpitations today. Pt had a-fib with cardioversion on Friday. Hx of HTN, A-fib. Shortness of breath, palpitations EXAM: PORTABLE CHEST - 1 VIEW COMPARISON:  12/04/2021 FINDINGS: New moderate left pleural effusion. Atelectasis/consolidation at the left lung base. Mild perihilar interstitial edema suspected. Heart size upper limits normal for technique. Aortic  Atherosclerosis (ICD10-170.0). No pneumothorax. Visualized bones unremarkable. IMPRESSION: 1. New moderate left pleural effusion. 2. Suspect mild perihilar interstitial edema Electronically Signed   By: Lucrezia Europe M.D.   On: 01/17/2022 15:49   ECHOCARDIOGRAM COMPLETE  Result Date: 01/18/2022    ECHOCARDIOGRAM REPORT   Patient Name:   Kristina Dougherty Date of Exam: 01/18/2022 Medical Rec #:  [id number]         Height:       62.0 in Accession #:    [id number]        Weight:       185.2 lb Date of Birth:  Apr 20, 1946         BSA:          1.850 m Patient Age:    76 years          BP:           93/68 mmHg Patient Gender: F                 HR:           106 bpm. Exam Location:  Inpatient Procedure: 2D Echo Indications:    Acute systolic CHF  History:        Patient has prior history of Echocardiogram examinations, most                 recent 12/05/2021. Arrythmias:Atrial Fibrillation; Risk                 Factors:Hypertension.  Sonographer:    Arlyss Gandy Referring Phys: [id number] NOBSJGGEZ RATHORE  Sonographer Comments: Image acquisition challenging due to patient body habitus. IMPRESSIONS  1. Left ventricular ejection fraction, by estimation, is 40 to 45%. The left ventricle has mildly decreased function. The left ventricle demonstrates global hypokinesis. Left ventricular diastolic parameters are indeterminate.  2. Right ventricular systolic function is normal. The right ventricular size is mildly enlarged. There is normal pulmonary artery systolic pressure. The estimated right ventricular systolic pressure is 66.2 mmHg.  3. The pericardial effusion is circumferential. There is no evidence of cardiac tamponade.  4. The mitral valve is normal in structure. Mild mitral valve regurgitation. No evidence of mitral stenosis.  5. Tricuspid valve regurgitation is moderate.  6. The aortic valve is normal in structure. Aortic valve regurgitation is not visualized. No aortic stenosis is present.  7. The inferior vena cava is  dilated in size with <50% respiratory variability, suggesting right atrial pressure of 15  mmHg. Comparison(s): Prior images reviewed side by side. The left ventricular function is worsened. FINDINGS  Left Ventricle: Left ventricular ejection fraction, by estimation, is 40 to 45%. The left ventricle has mildly decreased function. The left ventricle demonstrates global hypokinesis. The left ventricular internal cavity size was normal in size. There is  no left ventricular hypertrophy. Left ventricular diastolic parameters are indeterminate. Right Ventricle: The right ventricular size is mildly enlarged. No increase in right ventricular wall thickness. Right ventricular systolic function is normal. There is normal pulmonary artery systolic pressure. The tricuspid regurgitant velocity is 2.08  m/s, and with an assumed right atrial pressure of 15 mmHg, the estimated right ventricular systolic pressure is 77.4 mmHg. Left Atrium: Left atrial size was normal in size. Right Atrium: Right atrial size was normal in size. Pericardium: Trivial pericardial effusion is present. The pericardial effusion is circumferential. There is no evidence of cardiac tamponade. Mitral Valve: The mitral valve is normal in structure. Mild mitral valve regurgitation. No evidence of mitral valve stenosis. Tricuspid Valve: The tricuspid valve is normal in structure. Tricuspid valve regurgitation is moderate . No evidence of tricuspid stenosis. Aortic Valve: The aortic valve is normal in structure. Aortic valve regurgitation is not visualized. No aortic stenosis is present. Aortic valve mean gradient measures 1.0 mmHg. Aortic valve peak gradient measures 1.9 mmHg. Aortic valve area, by VTI measures 1.85 cm. Pulmonic Valve: The pulmonic valve was normal in structure. Pulmonic valve regurgitation is trivial. No evidence of pulmonic stenosis. Aorta: The aortic root is normal in size and structure. Venous: The inferior vena cava is dilated in size with  less than 50% respiratory variability, suggesting right atrial pressure of 15 mmHg. IAS/Shunts: No atrial level shunt detected by color flow Doppler.  LEFT VENTRICLE PLAX 2D LVIDd:         3.80 cm   Diastology LVIDs:         3.06 cm   LV e' medial:    7.07 cm/s LV PW:         0.75 cm   LV E/e' medial:  9.8 LV IVS:        0.76 cm   LV e' lateral:   8.70 cm/s LVOT diam:     1.60 cm   LV E/e' lateral: 8.0 LV SV:         24 LV SV Index:   13 LVOT Area:     2.01 cm  RIGHT VENTRICLE RV Basal diam:  3.98 cm RV Mid diam:    2.96 cm RV S prime:     6.96 cm/s TAPSE (M-mode): 1.0 cm LEFT ATRIUM             Index        RIGHT ATRIUM           Index LA diam:        3.70 cm 2.00 cm/m   RA Area:     18.90 cm LA Vol (A2C):   44.4 ml 24.00 ml/m  RA Volume:   52.00 ml  28.11 ml/m LA Vol (A4C):   50.8 ml 27.46 ml/m LA Biplane Vol: 51.5 ml 27.84 ml/m  AORTIC VALVE AV Area (Vmax):    1.87 cm AV Area (Vmean):   1.85 cm AV Area (VTI):     1.85 cm AV Vmax:           69.80 cm/s AV Vmean:          49.500 cm/s AV VTI:  0.128 m AV Peak Grad:      1.9 mmHg AV Mean Grad:      1.0 mmHg LVOT Vmax:         64.80 cm/s LVOT Vmean:        45.600 cm/s LVOT VTI:          0.118 m LVOT/AV VTI ratio: 0.92  AORTA Ao Root diam: 2.80 cm Ao Asc diam:  3.10 cm MITRAL VALVE               TRICUSPID VALVE MV Area (PHT): 5.13 cm    TR Peak grad:   17.3 mmHg MV Decel Time: 148 msec    TR Vmax:        208.00 cm/s MV E velocity: 69.20 cm/s                            SHUNTS                            Systemic VTI:  0.12 m                            Systemic Diam: 1.60 cm [name] MD Electronically signed by [name] MD Signature Date/Time: 01/18/2022/10:45:33 AM    Final     Cardiac Studies   2D echo 01/18/2022 IMPRESSIONS    1. Left ventricular ejection fraction, by estimation, is 40 to 45%. The  left ventricle has mildly decreased function. The left ventricle  demonstrates global hypokinesis. Left ventricular diastolic parameters are   indeterminate.   2. Right ventricular systolic function is normal. The right ventricular  size is mildly enlarged. There is normal pulmonary artery systolic  pressure. The estimated right ventricular systolic pressure is 47.4 mmHg.   3. The pericardial effusion is circumferential. There is no evidence of  cardiac tamponade.   4. The mitral valve is normal in structure. Mild mitral valve  regurgitation. No evidence of mitral stenosis.   5. Tricuspid valve regurgitation is moderate.   6. The aortic valve is normal in structure. Aortic valve regurgitation is  not visualized. No aortic stenosis is present.   7. The inferior vena cava is dilated in size with <50% respiratory  variability, suggesting right atrial pressure of 15 mmHg.   Comparison(s): Prior images reviewed side by side. The left ventricular  function is worsened.   Patient Profile     76 y.o. female with a hx of A-fib on Eliquis, hypertension, anxiety, depression, GERD, tobacco use who is being seen  for the evaluation of atrial fibrillation and heart failure at the request of Dr Shela Leff.   Assessment & Plan    Atrial Fibrillation with RVR -Her A-fib was diagnosed this January 2023, when she is admitted from 1/8-1/10, started on apixaban and Toprol -Has had recurrence twice now, status post 2 unsuccessful cardioversions on 01/09/2022 and 01/17/2022.   -started on amiodarone in the outpatient setting with oral load.   -presented this admit with acute CHF and afib with RVR -s/p DCCV in ER this admit but went back into afib -TSH is normal -she thinks she missed a dose of ELiquis 2 weeks ago -now on IV Amio reload -BP too soft to add BB at this time -need to replete K+ to Keep > 4 -HR still at times in the 120's -will make NPO after MN  for TEE/DCCV tomorrow>>since she missed a dose of Eliquis in the past 2 weeks -Shared Decision Making/Informed Consent The risks [stroke, cardiac arrhythmias rarely resulting in the  need for a temporary or permanent pacemaker, skin irritation or burns, esophageal damage, perforation (1:10,000 risk), bleeding, pharyngeal hematoma as well as other potential complications associated with conscious sedation including aspiration, arrhythmia, respiratory failure and death], benefits (treatment guidance, restoration of normal sinus rhythm, diagnostic support) and alternatives of a transesophageal echocardiogram guided cardioversion were discussed in detail with Ms. Firebaugh and she is willing to proceed.    2.   Pericardial Effusion -New pericardial effusion seen by emergency department physician at bedside POCUS which is developed since 12/05/2021 with the last echo.  Unclear source.  No signs or symptoms of tamponade at this time.   - echo yesterday showed trivial pericardial effusion    3.  Acute combined systolic/diastolic CHF -EF normal on echo from January -EF on echo this admit 40-45% and likely related to tachy mediated process for afib with RVR -BP too soft for BB or ARB/Entresto -she put out 3L yesterday and is net neg 1.7L since admite -weight down 5lbs from admit -SCr bumped from 1.22 to 1.26 -BNP 663 on admit -Cxray shows left basilar consolidation with pleural effusion -she is afebrile with normal WBC so Cxray findings likely not PNA and suspect asymmetric edema -continue IV Lasix for now following renal function closely -repeat BNP today  4.  Hypokalemia/Hypomagnesemia -K+ 3.3 this am -need to keep K>4 -Mag low at 1.7 likely contributing -will give Kdur 81meq BID while diuresing -give MagSO4 2gm IV -recheck K+ and Mag in am  I have spent a total of 40 minutes with patient reviewing 2D echo , telemetry, EKGs, labs and examining patient as well as establishing an assessment and plan that was discussed with the patient.  > 50% of time was spent in direct patient care.     For questions or updates, please contact [place] Please consult [email or web address] for  contact info under        Signed, Fransico Him, MD  01/19/2022, 10:13 AM

## 2022-01-19 NOTE — TOC Benefit Eligibility Note (Signed)
Patient Teacher, English as a foreign language completed.    The patient is currently admitted and upon discharge could be taking Entresto 24-26 mg.  The current 30 day co-pay is, $10.35.   The patient is currently admitted and upon discharge could be taking Farxiga 10 mg.  The current 30 day co-pay is, $10.35.   The patient is currently admitted and upon discharge could be taking Jardiance 10 mg.  The current 30 day co-pay is, $10.35.   The patient is insured through [place], [place] Patient Advocate Specialist [place] Patient Advocate Team Direct Number: [phone]  Fax: [phone]

## 2022-01-20 ENCOUNTER — Encounter (HOSPITAL_COMMUNITY)
Admission: EM | Disposition: A | Discharge status: Home / Self Care | Payer: Self-pay | Source: Ambulatory Visit | Attending: Internal Medicine

## 2022-01-20 ENCOUNTER — Inpatient Hospital Stay (HOSPITAL_COMMUNITY): Payer: Medicare Other

## 2022-01-20 ENCOUNTER — Inpatient Hospital Stay (HOSPITAL_COMMUNITY): Payer: Medicare Other | Admitting: Anesthesiology

## 2022-01-20 ENCOUNTER — Encounter (HOSPITAL_COMMUNITY): Payer: Medicare Other | Admitting: Physician Assistant

## 2022-01-20 DIAGNOSIS — I340 Nonrheumatic mitral (valve) insufficiency: Secondary | ICD-10-CM

## 2022-01-20 DIAGNOSIS — I480 Paroxysmal atrial fibrillation: Secondary | ICD-10-CM

## 2022-01-20 DIAGNOSIS — Q2112 Patent foramen ovale: Secondary | ICD-10-CM

## 2022-01-20 DIAGNOSIS — I081 Rheumatic disorders of both mitral and tricuspid valves: Secondary | ICD-10-CM

## 2022-01-20 DIAGNOSIS — F418 Other specified anxiety disorders: Secondary | ICD-10-CM

## 2022-01-20 DIAGNOSIS — I10 Essential (primary) hypertension: Secondary | ICD-10-CM

## 2022-01-20 HISTORY — PX: TEE WITHOUT CARDIOVERSION: SHX5443

## 2022-01-20 HISTORY — PX: CARDIOVERSION: SHX1299

## 2022-01-20 LAB — CBC WITH DIFFERENTIAL/PLATELET
Abs Immature Granulocytes: 0.03 10*3/uL (ref 0.00–0.07)
Basophils Absolute: 0.1 10*3/uL (ref 0.0–0.1)
Basophils Relative: 1 %
Eosinophils Absolute: 0.2 10*3/uL (ref 0.0–0.5)
Eosinophils Relative: 2 %
HCT: 40.9 % (ref 36.0–46.0)
Hemoglobin: 13.9 g/dL (ref 12.0–15.0)
Immature Granulocytes: 0 %
Lymphocytes Relative: 21 %
Lymphs Abs: 1.6 10*3/uL (ref 0.7–4.0)
MCH: 28.4 pg (ref 26.0–34.0)
MCHC: 34 g/dL (ref 30.0–36.0)
MCV: 83.5 fL (ref 80.0–100.0)
Monocytes Absolute: 0.9 10*3/uL (ref 0.1–1.0)
Monocytes Relative: 11 %
Neutro Abs: 4.8 10*3/uL (ref 1.7–7.7)
Neutrophils Relative %: 65 %
Platelets: 165 10*3/uL (ref 150–400)
RBC: 4.9 MIL/uL (ref 3.87–5.11)
RDW: 13.4 % (ref 11.5–15.5)
WBC: 7.5 10*3/uL (ref 4.0–10.5)
nRBC: 0 % (ref 0.0–0.2)

## 2022-01-20 LAB — PHOSPHORUS: Phosphorus: 3.3 mg/dL (ref 2.5–4.6)

## 2022-01-20 LAB — COMPREHENSIVE METABOLIC PANEL
ALT: 17 U/L (ref 0–44)
AST: 18 U/L (ref 15–41)
Albumin: 3 g/dL — ABNORMAL LOW (ref 3.5–5.0)
Alkaline Phosphatase: 49 U/L (ref 38–126)
Anion gap: 10 (ref 5–15)
BUN: 9 mg/dL (ref 8–23)
CO2: 31 mmol/L (ref 22–32)
Calcium: 8.6 mg/dL — ABNORMAL LOW (ref 8.9–10.3)
Chloride: 99 mmol/L (ref 98–111)
Creatinine, Ser: 1.28 mg/dL — ABNORMAL HIGH (ref 0.44–1.00)
GFR, Estimated: 43 mL/min — ABNORMAL LOW (ref >60)
Glucose, Bld: 103 mg/dL — ABNORMAL HIGH (ref 70–99)
Potassium: 3.6 mmol/L (ref 3.5–5.1)
Sodium: 140 mmol/L (ref 135–145)
Total Bilirubin: 0.5 mg/dL (ref 0.3–1.2)
Total Protein: 5.2 g/dL — ABNORMAL LOW (ref 6.5–8.1)

## 2022-01-20 LAB — MAGNESIUM
Magnesium: 1.8 mg/dL (ref 1.7–2.4)
Magnesium: 1.9 mg/dL (ref 1.7–2.4)

## 2022-01-20 SURGERY — ECHOCARDIOGRAM, TRANSESOPHAGEAL
Anesthesia: Monitor Anesthesia Care

## 2022-01-20 MED ORDER — PROPOFOL 500 MG/50ML IV EMUL
INTRAVENOUS | Status: DC | PRN
  Administered 2022-01-20: 75 ug/kg/min via INTRAVENOUS

## 2022-01-20 MED ORDER — SENNOSIDES-DOCUSATE SODIUM 8.6-50 MG PO TABS
1.0000 | ORAL_TABLET | Freq: Two times a day (BID) | ORAL | Status: DC
  Administered 2022-01-20 – 2022-01-22 (×4): 1 via ORAL
  Filled 2022-01-20 (×4): qty 1

## 2022-01-20 MED ORDER — PHENYLEPHRINE 40 MCG/ML (10ML) SYRINGE FOR IV PUSH (FOR BLOOD PRESSURE SUPPORT)
PREFILLED_SYRINGE | INTRAVENOUS | Status: DC | PRN
  Administered 2022-01-20 (×3): 80 ug via INTRAVENOUS

## 2022-01-20 MED ORDER — MAGNESIUM SULFATE 2 GM/50ML IV SOLN
2.0000 g | Freq: Once | INTRAVENOUS | Status: AC
Start: 2022-01-20 — End: 2022-01-20
  Administered 2022-01-20: 2 g via INTRAVENOUS
  Filled 2022-01-20: qty 50

## 2022-01-20 MED ORDER — POTASSIUM CHLORIDE CRYS ER 20 MEQ PO TBCR
40.0000 meq | EXTENDED_RELEASE_TABLET | Freq: Three times a day (TID) | ORAL | Status: DC
  Administered 2022-01-20 (×3): 40 meq via ORAL
  Filled 2022-01-20 (×3): qty 2

## 2022-01-20 MED ORDER — POLYETHYLENE GLYCOL 3350 17 G PO PACK
17.0000 g | PACK | Freq: Two times a day (BID) | ORAL | Status: DC
  Administered 2022-01-20 – 2022-01-22 (×4): 17 g via ORAL
  Filled 2022-01-20 (×4): qty 1

## 2022-01-20 MED ORDER — PROPOFOL 10 MG/ML IV BOLUS
INTRAVENOUS | Status: DC | PRN
  Administered 2022-01-20: 50 mg via INTRAVENOUS
  Administered 2022-01-20: 40 mg via INTRAVENOUS

## 2022-01-20 NOTE — Anesthesia Preprocedure Evaluation (Addendum)
Anesthesia Evaluation  Patient identified by MRN, date of birth, ID band Patient awake    Reviewed: Allergy & Precautions, NPO status , Patient's Chart, lab work & pertinent test results  History of Anesthesia Complications Negative for: history of anesthetic complications  Airway Mallampati: III  TM Distance: >3 FB Neck ROM: Full    Dental  (+) Poor Dentition, Chipped, Missing   Pulmonary neg shortness of breath, neg COPD, neg recent URI, Current Smoker and Patient abstained from smoking.,    breath sounds clear to auscultation       Cardiovascular hypertension, Pt. on medications and Pt. on home beta blockers (-) angina+ dysrhythmias Atrial Fibrillation  Rhythm:Irregular Rate:Normal  1. Left ventricular ejection fraction, by estimation, is 55 to 60%. The  left ventricle has normal function. The left ventricle has no regional  wall motion abnormalities. Left ventricular diastolic parameters are  indeterminate.  2. Right ventricular systolic function is normal. The right ventricular  size is normal. There is normal pulmonary artery systolic pressure. The  estimated right ventricular systolic pressure is 55.9 mmHg.  3. The mitral valve is normal in structure. Mild mitral valve  regurgitation. No evidence of mitral stenosis.  4. The aortic valve is tricuspid. Aortic valve regurgitation is not  visualized. No aortic stenosis is present.  5. The inferior vena cava is normal in size with greater than 50%  respiratory variability, suggesting right atrial pressure of 3 mmHg.  6. Cannot exclude a small PFO.    Neuro/Psych PSYCHIATRIC DISORDERS Anxiety Depression  Neuromuscular disease    GI/Hepatic Neg liver ROS,   Endo/Other  negative endocrine ROSLab Results      Component                Value               Date                      HGBA1C                   5.3                 12/19/2006             Renal/GU ARFRenal  diseaseLab Results      Component                Value               Date                      CREATININE               1.27 (H)            01/09/2022             negative genitourinary   Musculoskeletal  (+) Arthritis ,   Abdominal Normal abdominal exam  (+)   Peds  Hematology  (+) Blood dyscrasia, , Lab Results      Component                Value               Date                      WBC  14.2 (H)            01/09/2022                HGB                      14.9                01/09/2022                HCT                      45.7                01/09/2022                MCV                      85.1                01/09/2022                PLT                      213                 01/09/2022             eliquis   Anesthesia Other Findings   Reproductive/Obstetrics                             Anesthesia Physical  Anesthesia Plan  ASA: 2  Anesthesia Plan: MAC and General   Post-op Pain Management: Minimal or no pain anticipated   Induction: Intravenous  PONV Risk Score and Plan: 2 and Propofol infusion and Treatment may vary due to age or medical condition  Airway Management Planned: Nasal Cannula  Additional Equipment: None  Intra-op Plan:   Post-operative Plan:   Informed Consent: I have reviewed the patients History and Physical, chart, labs and discussed the procedure including the risks, benefits and alternatives for the proposed anesthesia with the patient or authorized representative who has indicated his/her understanding and acceptance.     Dental advisory given  Plan Discussed with: CRNA  Anesthesia Plan Comments:         Anesthesia Quick Evaluation

## 2022-01-20 NOTE — H&P (View-Only) (Signed)
Progress Note  Patient Name: Kristina Dougherty Date of Encounter: 01/20/2022  [facility] Cardiologist: None NEW  Subjective   Admitted with afib with RVR and CHF.  Denies any chest pain or SOB. Remains in atrial fibrillation with RVR on tele. Diuresing well and put out 3.3L yesterday and net neg 3.7L  Inpatient Medications    Scheduled Meds:  apixaban  5 mg Oral BID   furosemide  40 mg Intravenous BID   pantoprazole  40 mg Oral Daily   PARoxetine  20 mg Oral BID   potassium chloride  40 mEq Oral BID   Continuous Infusions:  sodium chloride 20 mL/hr at 01/20/22 0455   amiodarone 30 mg/hr (01/20/22 0455)   PRN Meds: acetaminophen **OR** acetaminophen, ALPRAZolam, alum & mag hydroxide-simeth, polyethylene glycol   Vital Signs    Vitals:   01/19/22 1946 01/19/22 2140 01/20/22 0000 01/20/22 0411  BP: (!) 128/105 (!) 131/108 102/82 100/81  Pulse: (!) 137 (!) 128  (!) 45  Resp: 17  17 15   Temp: 98.5 F (36.9 C)  98.3 F (36.8 C) (!) 97.5 F (36.4 C)  TempSrc: Oral  Oral Oral  SpO2: 93%  92% 92%  Weight:    80.7 kg  Height:        Intake/Output Summary (Last 24 hours) at 01/20/2022 0749 Last data filed at 01/20/2022 0455 Gross per 24 hour  Intake 1512.55 ml  Output 3300 ml  Net -1787.45 ml    Last 3 Weights 01/20/2022 01/19/2022 01/18/2022  Weight (lbs) 178 lb 180 lb 6.4 oz 182 lb 5.1 oz  Weight (kg) 80.74 kg 81.829 kg 82.7 kg      Telemetry    Atrial fibrillation with RVR - Personally Reviewed  ECG    Atrial fibrillation with RVR,low voltage QRS, anterior infarct age undetermined - Personally Reviewed  Physical Exam   GEN: Well nourished, well developed in no acute distress HEENT: Normal NECK: No JVD; No carotid bruits LYMPHATICS: No lymphadenopathy CARDIAC:irregularly irregular and tachy, no murmurs, rubs, gallops RESPIRATORY:  Clear to auscultation without rales, wheezing or rhonchi  ABDOMEN: Soft, non-tender, non-distended MUSCULOSKELETAL:  No  edema; No deformity  SKIN: Warm and dry NEUROLOGIC:  Alert and oriented x 3 PSYCHIATRIC:  Normal affect   Labs    High Sensitivity Troponin:   Recent Labs  Lab 01/17/22 1536 01/17/22 1855  TROPONINIHS 12 14       Chemistry Recent Labs  Lab 01/19/22 0445 01/19/22 2215 01/20/22 0555  NA 140 137 140  K 3.3* 3.7 3.6  CL 98 94* 99  CO2 32 31 31  GLUCOSE 96 153* 103*  BUN 11 9 9   CREATININE 1.26* 1.28* 1.28*  CALCIUM 8.4* 8.8* 8.6*  PROT 5.1*  --  5.2*  ALBUMIN 3.0*  --  3.0*  AST 23  --  18  ALT 21  --  17  ALKPHOS 47  --  49  BILITOT 0.6  --  0.5  GFRNONAA 44* 43* 43*  ANIONGAP 10 12 10       Hematology Recent Labs  Lab 01/17/22 1536 01/19/22 0445 01/20/22 0555  WBC 9.5 7.8 7.5  RBC 5.51* 4.83 4.90  HGB 15.0 13.5 13.9  HCT 47.1* 39.9 40.9  MCV 85.5 82.6 83.5  MCH 27.2 28.0 28.4  MCHC 31.8 33.8 34.0  RDW 13.3 13.6 13.4  PLT 242 164 165     BNP Recent Labs  Lab 01/17/22 1537 01/19/22 0445  BNP 663.7* 263.3*  DDimer No results for input(s): DDIMER in the last 168 hours.   CHA2DS2-VASc Score = 4  This indicates a 4.8% annual risk of stroke. The patient's score is based upon: CHF History: 0 HTN History: 1 Diabetes History: 0 Stroke History: 0 Vascular Disease History: 0 Age Score: 2 Gender Score: 1   Radiology    DG CHEST PORT 1 VIEW  Result Date: 01/19/2022 CLINICAL DATA:  Shortness of breath in a 76 year old female. EXAM: PORTABLE CHEST 1 VIEW COMPARISON:  February 21st of 2023. FINDINGS: EKG leads project over the chest. Persistent LEFT basilar consolidation and pleural effusion obscures LEFT heart border. Cardiomediastinal contours and hilar structures, visualized portions are stable. RIGHT lung is clear.  No sign of pneumothorax. On limited assessment there is no acute skeletal process. IMPRESSION: Persistent LEFT basilar consolidation and moderate LEFT pleural effusion. Electronically Signed   By: Zetta Bills M.D.   On:  01/19/2022 08:06   ECHOCARDIOGRAM COMPLETE  Result Date: 01/18/2022    ECHOCARDIOGRAM REPORT   Patient Name:   Kristina Dougherty Date of Exam: 01/18/2022 Medical Rec #:  [id number]         Height:       62.0 in Accession #:    [id number]        Weight:       185.2 lb Date of Birth:  [birth year]         BSA:          1.850 m Patient Age:    76 years          BP:           93/68 mmHg Patient Gender: F                 HR:           106 bpm. Exam Location:  Inpatient Procedure: 2D Echo Indications:    Acute systolic CHF  History:        Patient has prior history of Echocardiogram examinations, most                 recent 12/05/2021. Arrythmias:Atrial Fibrillation; Risk                 Factors:Hypertension.  Sonographer:    Arlyss Gandy Referring Phys: [id number] BTDVVOHYW RATHORE  Sonographer Comments: Image acquisition challenging due to patient body habitus. IMPRESSIONS  1. Left ventricular ejection fraction, by estimation, is 40 to 45%. The left ventricle has mildly decreased function. The left ventricle demonstrates global hypokinesis. Left ventricular diastolic parameters are indeterminate.  2. Right ventricular systolic function is normal. The right ventricular size is mildly enlarged. There is normal pulmonary artery systolic pressure. The estimated right ventricular systolic pressure is 73.7 mmHg.  3. The pericardial effusion is circumferential. There is no evidence of cardiac tamponade.  4. The mitral valve is normal in structure. Mild mitral valve regurgitation. No evidence of mitral stenosis.  5. Tricuspid valve regurgitation is moderate.  6. The aortic valve is normal in structure. Aortic valve regurgitation is not visualized. No aortic stenosis is present.  7. The inferior vena cava is dilated in size with <50% respiratory variability, suggesting right atrial pressure of 15 mmHg. Comparison(s): Prior images reviewed side by side. The left ventricular function is worsened. FINDINGS  Left Ventricle: Left  ventricular ejection fraction, by estimation, is 40 to 45%. The left ventricle has mildly decreased function. The left ventricle demonstrates global hypokinesis. The left ventricular internal cavity size was normal  in size. There is  no left ventricular hypertrophy. Left ventricular diastolic parameters are indeterminate. Right Ventricle: The right ventricular size is mildly enlarged. No increase in right ventricular wall thickness. Right ventricular systolic function is normal. There is normal pulmonary artery systolic pressure. The tricuspid regurgitant velocity is 2.08  m/s, and with an assumed right atrial pressure of 15 mmHg, the estimated right ventricular systolic pressure is 01.7 mmHg. Left Atrium: Left atrial size was normal in size. Right Atrium: Right atrial size was normal in size. Pericardium: Trivial pericardial effusion is present. The pericardial effusion is circumferential. There is no evidence of cardiac tamponade. Mitral Valve: The mitral valve is normal in structure. Mild mitral valve regurgitation. No evidence of mitral valve stenosis. Tricuspid Valve: The tricuspid valve is normal in structure. Tricuspid valve regurgitation is moderate . No evidence of tricuspid stenosis. Aortic Valve: The aortic valve is normal in structure. Aortic valve regurgitation is not visualized. No aortic stenosis is present. Aortic valve mean gradient measures 1.0 mmHg. Aortic valve peak gradient measures 1.9 mmHg. Aortic valve area, by VTI measures 1.85 cm. Pulmonic Valve: The pulmonic valve was normal in structure. Pulmonic valve regurgitation is trivial. No evidence of pulmonic stenosis. Aorta: The aortic root is normal in size and structure. Venous: The inferior vena cava is dilated in size with less than 50% respiratory variability, suggesting right atrial pressure of 15 mmHg. IAS/Shunts: No atrial level shunt detected by color flow Doppler.  LEFT VENTRICLE PLAX 2D LVIDd:         3.80 cm   Diastology LVIDs:          3.06 cm   LV e' medial:    7.07 cm/s LV PW:         0.75 cm   LV E/e' medial:  9.8 LV IVS:        0.76 cm   LV e' lateral:   8.70 cm/s LVOT diam:     1.60 cm   LV E/e' lateral: 8.0 LV SV:         24 LV SV Index:   13 LVOT Area:     2.01 cm  RIGHT VENTRICLE RV Basal diam:  3.98 cm RV Mid diam:    2.96 cm RV S prime:     6.96 cm/s TAPSE (M-mode): 1.0 cm LEFT ATRIUM             Index        RIGHT ATRIUM           Index LA diam:        3.70 cm 2.00 cm/m   RA Area:     18.90 cm LA Vol (A2C):   44.4 ml 24.00 ml/m  RA Volume:   52.00 ml  28.11 ml/m LA Vol (A4C):   50.8 ml 27.46 ml/m LA Biplane Vol: 51.5 ml 27.84 ml/m  AORTIC VALVE AV Area (Vmax):    1.87 cm AV Area (Vmean):   1.85 cm AV Area (VTI):     1.85 cm AV Vmax:           69.80 cm/s AV Vmean:          49.500 cm/s AV VTI:            0.128 m AV Peak Grad:      1.9 mmHg AV Mean Grad:      1.0 mmHg LVOT Vmax:         64.80 cm/s LVOT Vmean:  45.600 cm/s LVOT VTI:          0.118 m LVOT/AV VTI ratio: 0.92  AORTA Ao Root diam: 2.80 cm Ao Asc diam:  3.10 cm MITRAL VALVE               TRICUSPID VALVE MV Area (PHT): 5.13 cm    TR Peak grad:   17.3 mmHg MV Decel Time: 148 msec    TR Vmax:        208.00 cm/s MV E velocity: 69.20 cm/s                            SHUNTS                            Systemic VTI:  0.12 m                            Systemic Diam: 1.60 cm [name] MD Electronically signed by [name] MD Signature Date/Time: 01/18/2022/10:45:33 AM    Final     Cardiac Studies   2D echo 01/18/2022 IMPRESSIONS    1. Left ventricular ejection fraction, by estimation, is 40 to 45%. The  left ventricle has mildly decreased function. The left ventricle  demonstrates global hypokinesis. Left ventricular diastolic parameters are  indeterminate.   2. Right ventricular systolic function is normal. The right ventricular  size is mildly enlarged. There is normal pulmonary artery systolic  pressure. The estimated right ventricular systolic  pressure is 63.8 mmHg.   3. The pericardial effusion is circumferential. There is no evidence of  cardiac tamponade.   4. The mitral valve is normal in structure. Mild mitral valve  regurgitation. No evidence of mitral stenosis.   5. Tricuspid valve regurgitation is moderate.   6. The aortic valve is normal in structure. Aortic valve regurgitation is  not visualized. No aortic stenosis is present.   7. The inferior vena cava is dilated in size with <50% respiratory  variability, suggesting right atrial pressure of 15 mmHg.   Comparison(s): Prior images reviewed side by side. The left ventricular  function is worsened.   Patient Profile     76 y.o. female with a hx of A-fib on Eliquis, hypertension, anxiety, depression, GERD, tobacco use who is being seen  for the evaluation of atrial fibrillation and heart failure at the request of Dr Shela Leff.   Assessment & Plan    Atrial Fibrillation with RVR -Her A-fib was diagnosed this January 2023, when she is admitted from 1/8-1/10, started on apixaban and Toprol -Has had recurrence twice now, status post 2 unsuccessful cardioversions on 01/09/2022 and 01/17/2022.   -started on amiodarone in the outpatient setting with oral load.   -presented this admit with acute CHF and afib with RVR -s/p DCCV in ER this admit but went back into afib -TSH is normal -she thinks she missed a dose of ELiquis 2 weeks ago -now on IV Amio reload -BP too soft to add BB at this time -need to replete K+ to Keep > 4 -HR still at times in the 110-120's -she is NPO for TEE/DCCV today   2.   Pericardial Effusion -New pericardial effusion seen by emergency department physician at bedside POCUS which is developed since 12/05/2021 with the last echo.  Unclear source.  No signs or symptoms of tamponade at this time.   -  echo this admit showed trivial pericardial effusion    3.  Acute combined systolic/diastolic CHF -EF normal on echo from January -EF on echo  this admit 40-45% and likely related to tachy mediated process for afib with RVR -BP too soft for BB or ARB/Entresto -she put out 3.3L yesterday and is net neg 3.7L since admite -weight down 7lbs from admit -SCr bumped from 1.22 to 1.28 and stable -BNP 663 on admit -Cxray shows left basilar consolidation with pleural effusion -she is afebrile with normal WBC so Cxray findings likely not PNA and suspect asymmetric edema -continue IV lasix for now -repeat BNP yesterday decreased from 664>>263  4.  Hypokalemia/Hypomagnesemia -K+ 3.6 this am -need to keep K>4 -Mag low at 1.8 likely contributing -will increase Kdur to 60meq TID while diuresing -give MagSO4 2gm IV today -recheck K+ and Mag in am   For questions or updates, please contact [place] Please consult [email or web address] for contact info under        Signed, Fransico Him, MD  01/20/2022, 7:49 AM

## 2022-01-20 NOTE — CV Procedure (Signed)
° °  Transesophageal Echocardiogram with cardioversion  Indications:AFIB  Time out performed  During this procedure the patient was administered propofol under anesthesiology supervision to achieve and maintain moderate sedation.  The patient's heart rate, blood pressure, and oxygen saturation are monitored continuously during the procedure.   Findings:  Left Ventricle: EF mildly to moderately reduced 40 to 45%  Mitral Valve: Mild mitral regurgitation  Aortic Valve: Normal  Tricuspid Valve: Moderate tricuspid regurgitation  Left Atrium: Moderately enlarged,  no left atrial appendage thrombus  Right Atrium: Moderately enlarged  Right ventricle: Mildly enlarged.  Intraatrial septum: PFO by color-flow Doppler  Small pericardial effusion  Bubble Contrast Study: Not performed  Impressions: Moderately reduced ejection fraction with no evidence of left atrial appendage thrombus.  Mild mitral regurgitation.  Moderate tricuspid d regurgitation.  Small pericardial effusion.  Small PFO noted.  RV enlargement.  Cardioversion was successfully performed with 200 J.  Sinus rhythm noted.  Brief episodes of ectopic atrial rhythm also noted.  [name], MD

## 2022-01-20 NOTE — Progress Notes (Signed)
Patient was having episodes of vfib/vtach, was not able to capture it, but fixed the electrodes and haven't seen anymore episodes. Cardiology notified. Will continue to monitor. Patient doesn't endorse any symptoms.

## 2022-01-20 NOTE — Anesthesia Postprocedure Evaluation (Signed)
Anesthesia Post Note  Patient: Kristina Dougherty  Procedure(s) Performed: TRANSESOPHAGEAL ECHOCARDIOGRAM (TEE) CARDIOVERSION     Patient location during evaluation: PACU Anesthesia Type: General Level of consciousness: awake Pain management: pain level controlled Vital Signs Assessment: post-procedure vital signs reviewed and stable Respiratory status: spontaneous breathing Cardiovascular status: stable Postop Assessment: no apparent nausea or vomiting Anesthetic complications: no   No notable events documented.  Last Vitals:  Vitals:   01/20/22 1110 01/20/22 1120  BP: (!) 82/57 93/68  Pulse: (!) 58 (!) 55  Resp: (!) 21 19  Temp:    SpO2: 95% 97%    Last Pain:  Vitals:   01/20/22 1105  TempSrc:   PainSc: 0-No pain                 Huston Foley

## 2022-01-20 NOTE — Progress Notes (Signed)
Progress Note  Patient Name: Kristina Dougherty Date of Encounter: 01/20/2022  [facility] Cardiologist: None NEW  Subjective   Admitted with afib with RVR and CHF.  Denies any chest pain or SOB. Remains in atrial fibrillation with RVR on tele. Diuresing well and put out 3.3L yesterday and net neg 3.7L  Inpatient Medications    Scheduled Meds:  apixaban  5 mg Oral BID   furosemide  40 mg Intravenous BID   pantoprazole  40 mg Oral Daily   PARoxetine  20 mg Oral BID   potassium chloride  40 mEq Oral BID   Continuous Infusions:  sodium chloride 20 mL/hr at 01/20/22 0455   amiodarone 30 mg/hr (01/20/22 0455)   PRN Meds: acetaminophen **OR** acetaminophen, ALPRAZolam, alum & mag hydroxide-simeth, polyethylene glycol   Vital Signs    Vitals:   01/19/22 1946 01/19/22 2140 01/20/22 0000 01/20/22 0411  BP: (!) 128/105 (!) 131/108 102/82 100/81  Pulse: (!) 137 (!) 128  (!) 45  Resp: 17  17 15   Temp: 98.5 F (36.9 C)  98.3 F (36.8 C) (!) 97.5 F (36.4 C)  TempSrc: Oral  Oral Oral  SpO2: 93%  92% 92%  Weight:    80.7 kg  Height:        Intake/Output Summary (Last 24 hours) at 01/20/2022 0749 Last data filed at 01/20/2022 0455 Gross per 24 hour  Intake 1512.55 ml  Output 3300 ml  Net -1787.45 ml    Last 3 Weights 01/20/2022 01/19/2022 01/18/2022  Weight (lbs) 178 lb 180 lb 6.4 oz 182 lb 5.1 oz  Weight (kg) 80.74 kg 81.829 kg 82.7 kg      Telemetry    Atrial fibrillation with RVR - Personally Reviewed  ECG    Atrial fibrillation with RVR,low voltage QRS, anterior infarct age undetermined - Personally Reviewed  Physical Exam   GEN: Well nourished, well developed in no acute distress HEENT: Normal NECK: No JVD; No carotid bruits LYMPHATICS: No lymphadenopathy CARDIAC:irregularly irregular and tachy, no murmurs, rubs, gallops RESPIRATORY:  Clear to auscultation without rales, wheezing or rhonchi  ABDOMEN: Soft, non-tender, non-distended MUSCULOSKELETAL:  No  edema; No deformity  SKIN: Warm and dry NEUROLOGIC:  Alert and oriented x 3 PSYCHIATRIC:  Normal affect   Labs    High Sensitivity Troponin:   Recent Labs  Lab 01/17/22 1536 01/17/22 1855  TROPONINIHS 12 14       Chemistry Recent Labs  Lab 01/19/22 0445 01/19/22 2215 01/20/22 0555  NA 140 137 140  K 3.3* 3.7 3.6  CL 98 94* 99  CO2 32 31 31  GLUCOSE 96 153* 103*  BUN 11 9 9   CREATININE 1.26* 1.28* 1.28*  CALCIUM 8.4* 8.8* 8.6*  PROT 5.1*  --  5.2*  ALBUMIN 3.0*  --  3.0*  AST 23  --  18  ALT 21  --  17  ALKPHOS 47  --  49  BILITOT 0.6  --  0.5  GFRNONAA 44* 43* 43*  ANIONGAP 10 12 10       Hematology Recent Labs  Lab 01/17/22 1536 01/19/22 0445 01/20/22 0555  WBC 9.5 7.8 7.5  RBC 5.51* 4.83 4.90  HGB 15.0 13.5 13.9  HCT 47.1* 39.9 40.9  MCV 85.5 82.6 83.5  MCH 27.2 28.0 28.4  MCHC 31.8 33.8 34.0  RDW 13.3 13.6 13.4  PLT 242 164 165     BNP Recent Labs  Lab 01/17/22 1537 01/19/22 0445  BNP 663.7* 263.3*  DDimer No results for input(s): DDIMER in the last 168 hours.   CHA2DS2-VASc Score = 4  This indicates a 4.8% annual risk of stroke. The patient's score is based upon: CHF History: 0 HTN History: 1 Diabetes History: 0 Stroke History: 0 Vascular Disease History: 0 Age Score: 2 Gender Score: 1   Radiology    DG CHEST PORT 1 VIEW  Result Date: 01/19/2022 CLINICAL DATA:  Shortness of breath in a 76 year old female. EXAM: PORTABLE CHEST 1 VIEW COMPARISON:  February 21st of 2023. FINDINGS: EKG leads project over the chest. Persistent LEFT basilar consolidation and pleural effusion obscures LEFT heart border. Cardiomediastinal contours and hilar structures, visualized portions are stable. RIGHT lung is clear.  No sign of pneumothorax. On limited assessment there is no acute skeletal process. IMPRESSION: Persistent LEFT basilar consolidation and moderate LEFT pleural effusion. Electronically Signed   By: Zetta Bills M.D.   On:  01/19/2022 08:06   ECHOCARDIOGRAM COMPLETE  Result Date: 01/18/2022    ECHOCARDIOGRAM REPORT   Patient Name:   Kristina Dougherty Date of Exam: 01/18/2022 Medical Rec #:  [id number]         Height:       62.0 in Accession #:    [id number]        Weight:       185.2 lb Date of Birth:  02-12-1946         BSA:          1.850 m Patient Age:    27 years          BP:           93/68 mmHg Patient Gender: F                 HR:           106 bpm. Exam Location:  Inpatient Procedure: 2D Echo Indications:    Acute systolic CHF  History:        Patient has prior history of Echocardiogram examinations, most                 recent 12/05/2021. Arrythmias:Atrial Fibrillation; Risk                 Factors:Hypertension.  Sonographer:    Arlyss Gandy Referring Phys: [id number] RATHORE  Sonographer Comments: Image acquisition challenging due to patient body habitus. IMPRESSIONS  1. Left ventricular ejection fraction, by estimation, is 40 to 45%. The left ventricle has mildly decreased function. The left ventricle demonstrates global hypokinesis. Left ventricular diastolic parameters are indeterminate.  2. Right ventricular systolic function is normal. The right ventricular size is mildly enlarged. There is normal pulmonary artery systolic pressure. The estimated right ventricular systolic pressure is 56.4 mmHg.  3. The pericardial effusion is circumferential. There is no evidence of cardiac tamponade.  4. The mitral valve is normal in structure. Mild mitral valve regurgitation. No evidence of mitral stenosis.  5. Tricuspid valve regurgitation is moderate.  6. The aortic valve is normal in structure. Aortic valve regurgitation is not visualized. No aortic stenosis is present.  7. The inferior vena cava is dilated in size with <50% respiratory variability, suggesting right atrial pressure of 15 mmHg. Comparison(s): Prior images reviewed side by side. The left ventricular function is worsened. FINDINGS  Left Ventricle: Left  ventricular ejection fraction, by estimation, is 40 to 45%. The left ventricle has mildly decreased function. The left ventricle demonstrates global hypokinesis. The left ventricular internal cavity size was normal  in size. There is  no left ventricular hypertrophy. Left ventricular diastolic parameters are indeterminate. Right Ventricle: The right ventricular size is mildly enlarged. No increase in right ventricular wall thickness. Right ventricular systolic function is normal. There is normal pulmonary artery systolic pressure. The tricuspid regurgitant velocity is 2.08  m/s, and with an assumed right atrial pressure of 15 mmHg, the estimated right ventricular systolic pressure is 19.3 mmHg. Left Atrium: Left atrial size was normal in size. Right Atrium: Right atrial size was normal in size. Pericardium: Trivial pericardial effusion is present. The pericardial effusion is circumferential. There is no evidence of cardiac tamponade. Mitral Valve: The mitral valve is normal in structure. Mild mitral valve regurgitation. No evidence of mitral valve stenosis. Tricuspid Valve: The tricuspid valve is normal in structure. Tricuspid valve regurgitation is moderate . No evidence of tricuspid stenosis. Aortic Valve: The aortic valve is normal in structure. Aortic valve regurgitation is not visualized. No aortic stenosis is present. Aortic valve mean gradient measures 1.0 mmHg. Aortic valve peak gradient measures 1.9 mmHg. Aortic valve area, by VTI measures 1.85 cm. Pulmonic Valve: The pulmonic valve was normal in structure. Pulmonic valve regurgitation is trivial. No evidence of pulmonic stenosis. Aorta: The aortic root is normal in size and structure. Venous: The inferior vena cava is dilated in size with less than 50% respiratory variability, suggesting right atrial pressure of 15 mmHg. IAS/Shunts: No atrial level shunt detected by color flow Doppler.  LEFT VENTRICLE PLAX 2D LVIDd:         3.80 cm   Diastology LVIDs:          3.06 cm   LV e' medial:    7.07 cm/s LV PW:         0.75 cm   LV E/e' medial:  9.8 LV IVS:        0.76 cm   LV e' lateral:   8.70 cm/s LVOT diam:     1.60 cm   LV E/e' lateral: 8.0 LV SV:         24 LV SV Index:   13 LVOT Area:     2.01 cm  RIGHT VENTRICLE RV Basal diam:  3.98 cm RV Mid diam:    2.96 cm RV S prime:     6.96 cm/s TAPSE (M-mode): 1.0 cm LEFT ATRIUM             Index        RIGHT ATRIUM           Index LA diam:        3.70 cm 2.00 cm/m   RA Area:     18.90 cm LA Vol (A2C):   44.4 ml 24.00 ml/m  RA Volume:   52.00 ml  28.11 ml/m LA Vol (A4C):   50.8 ml 27.46 ml/m LA Biplane Vol: 51.5 ml 27.84 ml/m  AORTIC VALVE AV Area (Vmax):    1.87 cm AV Area (Vmean):   1.85 cm AV Area (VTI):     1.85 cm AV Vmax:           69.80 cm/s AV Vmean:          49.500 cm/s AV VTI:            0.128 m AV Peak Grad:      1.9 mmHg AV Mean Grad:      1.0 mmHg LVOT Vmax:         64.80 cm/s LVOT Vmean:  45.600 cm/s LVOT VTI:          0.118 m LVOT/AV VTI ratio: 0.92  AORTA Ao Root diam: 2.80 cm Ao Asc diam:  3.10 cm MITRAL VALVE               TRICUSPID VALVE MV Area (PHT): 5.13 cm    TR Peak grad:   17.3 mmHg MV Decel Time: 148 msec    TR Vmax:        208.00 cm/s MV E velocity: 69.20 cm/s                            SHUNTS                            Systemic VTI:  0.12 m                            Systemic Diam: 1.60 cm [name] MD Electronically signed by [name] MD Signature Date/Time: 01/18/2022/10:45:33 AM    Final     Cardiac Studies   2D echo 01/18/2022 IMPRESSIONS    1. Left ventricular ejection fraction, by estimation, is 40 to 45%. The  left ventricle has mildly decreased function. The left ventricle  demonstrates global hypokinesis. Left ventricular diastolic parameters are  indeterminate.   2. Right ventricular systolic function is normal. The right ventricular  size is mildly enlarged. There is normal pulmonary artery systolic  pressure. The estimated right ventricular systolic  pressure is 56.3 mmHg.   3. The pericardial effusion is circumferential. There is no evidence of  cardiac tamponade.   4. The mitral valve is normal in structure. Mild mitral valve  regurgitation. No evidence of mitral stenosis.   5. Tricuspid valve regurgitation is moderate.   6. The aortic valve is normal in structure. Aortic valve regurgitation is  not visualized. No aortic stenosis is present.   7. The inferior vena cava is dilated in size with <50% respiratory  variability, suggesting right atrial pressure of 15 mmHg.   Comparison(s): Prior images reviewed side by side. The left ventricular  function is worsened.   Patient Profile     76 y.o. female with a hx of A-fib on Eliquis, hypertension, anxiety, depression, GERD, tobacco use who is being seen  for the evaluation of atrial fibrillation and heart failure at the request of Dr Shela Leff.   Assessment & Plan    Atrial Fibrillation with RVR -Her A-fib was diagnosed this January 2023, when she is admitted from 1/8-1/10, started on apixaban and Toprol -Has had recurrence twice now, status post 2 unsuccessful cardioversions on 01/09/2022 and 01/17/2022.   -started on amiodarone in the outpatient setting with oral load.   -presented this admit with acute CHF and afib with RVR -s/p DCCV in ER this admit but went back into afib -TSH is normal -she thinks she missed a dose of ELiquis 2 weeks ago -now on IV Amio reload -BP too soft to add BB at this time -need to replete K+ to Keep > 4 -HR still at times in the 110-120's -she is NPO for TEE/DCCV today   2.   Pericardial Effusion -New pericardial effusion seen by emergency department physician at bedside POCUS which is developed since 12/05/2021 with the last echo.  Unclear source.  No signs or symptoms of tamponade at this time.   -  echo this admit showed trivial pericardial effusion    3.  Acute combined systolic/diastolic CHF -EF normal on echo from January -EF on echo  this admit 40-45% and likely related to tachy mediated process for afib with RVR -BP too soft for BB or ARB/Entresto -she put out 3.3L yesterday and is net neg 3.7L since admite -weight down 7lbs from admit -SCr bumped from 1.22 to 1.28 and stable -BNP 663 on admit -Cxray shows left basilar consolidation with pleural effusion -she is afebrile with normal WBC so Cxray findings likely not PNA and suspect asymmetric edema -continue IV lasix for now -repeat BNP yesterday decreased from 664>>263  4.  Hypokalemia/Hypomagnesemia -K+ 3.6 this am -need to keep K>4 -Mag low at 1.8 likely contributing -will increase Kdur to 69meq TID while diuresing -give MagSO4 2gm IV today -recheck K+ and Mag in am   For questions or updates, please contact [name] Please consult [email or web address] for contact info under        Signed, Fransico Him, MD  01/20/2022, 7:49 AM

## 2022-01-20 NOTE — Progress Notes (Signed)
Progress Note   Patient: Kristina Dougherty ZOX:[id number] DOB: 04-01-1946 DOA: 01/17/2022     3 DOS: the patient was seen and examined on 01/20/2022   Brief hospital course: Patient is a 76 year old obese Caucasian female with a past medical history significant for but not limited to atrial fibrillation on anticoagulation with Eliquis, hypertension, anxiety and depression, GERD, tobacco abuse as well as other comorbidities who was diagnosed with new onset atrial fibrillation during her last hospitalization last month and was started on metoprolol 50 mg p.o. twice daily.  She was also started on anticoagulation at that time for CHA2DS2-VASc score of at least 4.  She was seen in the ED on 01/09/2022 for abdominal pain and found to be in A-fib with RVR and underwent successful cardioversion the same day.  Subsequently she followed up at the cardiology clinic 3 days later and was found to be in A-fib with RVR again and started on amiodarone 200 g p.o. twice daily.  She was seen by her PCP yesterday for volume overload and found to be in persistent A-fib with RVR.  She was subsequently then sent to the ED for further evaluation and rate was in the 140s on arrival to the ED.  She had no fever or leukocytosis but her creatinine was elevated at 1.4 up from a baseline of 0.7-0.9.  Her TSH was 5.9 her high sensitive troponins were negative x2.  BNP was elevated 663.  She had a bedside echocardiogram which showed a new small to moderate pericardial effusion without signs of tamponade.  There is no pericardial effusion seen on TTE on 2013 2023.  She was cardioverted in the ED and then converted to normal sinus rhythm and was started on IV diuresis but then subsequently went back to A-fib with RVR.  Cardiology was consulted formally and started the patient on amiodarone drip and recommended to continue diuresis given her volume overload.  She reported increasing shortness of breath last 3 days especially with exertion and  she is taking Lasix every other day.  In the ED she was cardioverted back into normal sinus rhythm but then subsequently back.  Cardiology recommends continuing amiodarone drip and transition to p.o. and then also recommends continuing diuresis with IV Lasix and further work-up for her volume overload.  She remains on the amiodarone drip but because of her heart uncontrolled cardiology to be bolusing her with IV amiodarone.  Cardiology is now planning a TEE/DCCV and this was done today.  Cardiology recommended continuing IV Lasix and following renal function carefully.  Assessment and Plan: * Atrial fibrillation with rapid ventricular response (HCC)- (present on admission) -Cardioverted in the ED and went sinus rhythm but bradycardic with heart rate in the 40s to 50s but unfortunately went back into A Fib With RVR -**Now she was Cardioverted with TEE/DCCV today and her TEE showed moderately reduced EF with no evidence of left atrial appendage thrombus and her cardioversion was successful with 200 J and times but was noted with brief ectopic atrial rhythms noted as well -Not hypotensive. -C/w Cardiac monitoring.   -Hold home metoprolol and Placed on Amiodarone gtt  With plans to transition to po per Cardiology recc's but given her Uncontrolled rates she is getting rebolused with Amiodarone  -C/w Anticoagulation with Apixaban 5 mg po BID  Pleural effusion due to CHF (congestive heart failure) ([place]) -Noted to have a Left Pleural Effusion on Admission CXR -Repeat CXR yesterday showed "Persistent LEFT basilar consolidation and moderate LEFT pleural effusion." -C/w  Diuresis per Cardiology and may need a Thoracentesis; she remains on IV 40 twice daily -Repeat CXR today showed "Unchanged moderate-sized left pleural effusion with adjacent left basilar opacity." -Currently not Hypoxic and feels as if her SOB is improving   Obesity (BMI 70-01.7) -Complicates overall prognosis and care -Estimated body mass  index is 33.87 kg/m as calculated from the following:   Height as of this encounter: 5\' 2"  (1.575 m).   Weight as of this encounter: 84 kg.  -Weight Loss and Dietary Counseling given   GERD (gastroesophageal reflux disease) -Continue Pantoprazole 40 mg po Daily   Constipation- (present on admission) -Patient was seen in the ED a week ago for abdominal pain, nausea, vomiting, and constipation.   -CT abdomen pelvis done at that time showing no acute findings.   -Most of her symptoms have now resolved except continues to endorse constipation.   -Abdominal exam benign. -C/w MiraLAX 17 grams po Daily as needed and will make it BID and start Senna-Docusate 1 tab po BID  Abnormal thyroid function test- (present on admission) -TSH 5.905 and Free T4 was 1.05 -Likely Subclinical Hypothyroidism   -Repeat Thyroid Function Tests in 4-6 weeks as an outpatient  Hypomagnesemia- (present on admission) -Magnesium replaced again with IV Mag Sulfate 1 grams -Mag Level went from 1.6 -> 1.9 -> 1.7 -> 1.8 -> 1.9 -We will replete with IV mag sulfate 2 g -Continue to Monitor and Replete as Necessary -Repeat Mag Level in the AM  AKI (acute kidney injury) ([place])- (present on admission) -Creatinine 1.42 on admission and BUN/creatinine improved to 13/1.22 yesterday but slightly bumped today and is 11/1.26 -> 9/1.28 -> 9/1.28; Baseline 0.7-0.9. -Continuing IV Lasix given for volume overload.  -BUN/creatinine is relatively stable from yesterday -Avoid nephrotoxic agents/ hold home hydrochlorothiazide and losartan.  Avoid contrast dyes, hypotension and renally dose medications -Continue to monitor renal function and urine output closely -Repeat CMP in a.m.  Acute systolic CHF (congestive heart failure) ([place])- (present on admission) -Likely secondary to uncontrolled A-fib.  BNP 663.   -Chest x-ray showing new moderate left pleural effusion and mild perihilar interstitial edema.   -TTE done 12/05/2021 showing EF  55 to 49%, diastolic parameters indeterminate, and mild mitral regurgitation.  -TEE done 01/09/2022 showing EF 60 to 65%.  ED physician had spoken to Dr. Harl Bowie from cardiology who recommended diuresis and repeat echocardiogram. -Repeat transthoracic echocardiogram ordered and done and showed "Left ventricular ejection fraction, by estimation, is 40 to 45%. The  left ventricle has mildly decreased function. The left ventricle demonstrates global hypokinesis. Left ventricular diastolic parameters are  Indeterminate." -Patient was given IV Lasix 40 mg in the ED.   -Cardiology has initiated on IV Lasix 40 mg twice daily now and will continue for today -Continue to monitor intake and output, daily weights.  -Patient is -4,873  mL since admission; admission weight was 185.19 pounds but repeat is improved to 177.91 -Continue Low-sodium diet with fluid restriction. -Further Care per Cardiology   Pericardial effusion- (present on admission) -Informed by ED physician that bedside echo showing new small to moderate pericardial effusion without signs of tamponade.   -There was no pericardial effusion seen on TEE done on 01/09/2022.     -Hypothyroidism could be a possible etiology as TSH is elevated on labs at 5.905 -Routine transthoracic echocardiogram ordered for further evaluation and showed "Trivial pericardial effusion is present. The pericardial effusion is circumferential. There is no evidence of cardiac tamponade." -Checking free T4 level and was  1.05 -Currently no signs of tamponade  Hypokalemia -K+ is now 3.6 -Replete with po KCl 40 mEQ BID x2 -Continue to Monitor and Replete as Necessary -Repeat CMP in the AM   Essential hypertension- (present on admission) -Stable. -Lasix given for diuresis.   -Currently Hold hydrochlorothiazide and losartan given AKI. -Continue to Monitor BP per Protocol; Last BP was 101/67  ANXIETY DEPRESSION- (present on admission) -Continue Paroxetine 20 mg po BID,  Alprazolam 0.25-0.5 mg po TID prn  Subjective: Seen and examined at bedside and she is doing fairly well after her cardioversion.  Denies chest pain or shortness breath.  States that she still has not had a bowel movement yet but passing gas.  No other concerns or complaints this time.  Physical Exam: Vitals:   01/20/22 1135 01/20/22 1200 01/20/22 1245 01/20/22 1501  BP: 104/75 (!) 129/100 114/75 101/67  Pulse: (!) 57 63 62 62  Resp: (!) 21 18 12 12   Temp:  98.2 F (36.8 C) 98.7 F (37.1 C) 98.5 F (36.9 C)  TempSrc:  Oral Oral   SpO2: 100% 96% 93% 92%  Weight:      Height:       Examination: Physical Exam:  Constitutional: WN/WD obese Caucasian female in no acute distress appears calm Respiratory: Diminished to auscultation bilaterally with coarse breath sounds worse on the left compared to the right, no wheezing, rales, rhonchi or crackles. Normal respiratory effort and patient is not tachypenic. No accessory muscle use.  Unlabored breathing not wearing supplemental oxygen nasal cannula Cardiovascular: RRR, no murmurs / rubs / gallops. S1 and S2 auscultated.  Trace lower extremity edema Abdomen: Soft, non-tender, distended secondary body habitus. Bowel sounds positive.  GU: Deferred. Musculoskeletal: No clubbing / cyanosis of digits/nails. No joint deformity upper and lower extremities.  Skin: No rashes, lesions, ulcers on limited skin evaluation. No induration; Warm and dry.   Data Reviewed:  I have independently reviewed and interpreted the patient's clinical laboratory data   Patient's CMP shows that she has a BUN/creatinine 9/1.28 and BNP is improved  Family Communication: No family present at bedside   Disposition: Status is: Inpatient Remains inpatient appropriate because: Needs Cardiac Clearance   Planned Discharge Destination:  Pending PT/OT Evaluation  DVT Prophylaxis: Apixaban  Author: Raiford Noble, DO [facility] 01/20/2022 6:10 PM  For on call  review [email or web address].

## 2022-01-20 NOTE — Progress Notes (Signed)
°  Echocardiogram Echocardiogram Transesophageal has been performed.  Kristina Dougherty M 01/20/2022, 11:15 AM

## 2022-01-20 NOTE — Transfer of Care (Signed)
Immediate Anesthesia Transfer of Care Note  Patient: Kristina Dougherty  Procedure(s) Performed: TRANSESOPHAGEAL ECHOCARDIOGRAM (TEE) CARDIOVERSION  Patient Location: PACU  Anesthesia Type:MAC  Level of Consciousness: drowsy  Airway & Oxygen Therapy: Patient Spontanous Breathing and Patient connected to nasal cannula oxygen  Post-op Assessment: Report given to RN and Post -op Vital signs reviewed and stable  Post vital signs: Reviewed and stable  Last Vitals:  Vitals Value Taken Time  BP 68/54 01/20/22 1104  Temp    Pulse 53 01/20/22 1106  Resp 22 01/20/22 1106  SpO2 93 % 01/20/22 1106  Vitals shown include unvalidated device data.  Last Pain:  Vitals:   01/20/22 1023  TempSrc: Oral  PainSc:          Complications: No notable events documented.

## 2022-01-20 NOTE — Care Management Important Message (Signed)
Important Message  Patient Details  Name: Kristina Dougherty MRN: [id number] Date of Birth: [birth year]   Medicare Important Message Given:  Yes     Hannah Beat 01/20/2022, 2:29 PM

## 2022-01-20 NOTE — Interval H&P Note (Signed)
History and Physical Interval Note:  01/20/2022 10:16 AM  Kristina Dougherty  has presented today for surgery, with the diagnosis of afib RVR.  The various methods of treatment have been discussed with the patient and family. After consideration of risks, benefits and other options for treatment, the patient has consented to  Procedure(s): TRANSESOPHAGEAL ECHOCARDIOGRAM (TEE) (N/A) CARDIOVERSION (N/A) as a surgical intervention.  The patient's history has been reviewed, patient examined, no change in status, stable for surgery.  I have reviewed the patient's chart and labs.  Questions were answered to the patient's satisfaction.     [employer]

## 2022-01-21 ENCOUNTER — Inpatient Hospital Stay (HOSPITAL_COMMUNITY): Payer: Medicare Other

## 2022-01-21 DIAGNOSIS — D696 Thrombocytopenia, unspecified: Secondary | ICD-10-CM

## 2022-01-21 LAB — COMPREHENSIVE METABOLIC PANEL
ALT: 15 U/L (ref 0–44)
AST: 17 U/L (ref 15–41)
Albumin: 2.9 g/dL — ABNORMAL LOW (ref 3.5–5.0)
Alkaline Phosphatase: 54 U/L (ref 38–126)
Anion gap: 9 (ref 5–15)
BUN: 13 mg/dL (ref 8–23)
CO2: 31 mmol/L (ref 22–32)
Calcium: 8.7 mg/dL — ABNORMAL LOW (ref 8.9–10.3)
Chloride: 98 mmol/L (ref 98–111)
Creatinine, Ser: 1.32 mg/dL — ABNORMAL HIGH (ref 0.44–1.00)
GFR, Estimated: 42 mL/min — ABNORMAL LOW (ref >60)
Glucose, Bld: 114 mg/dL — ABNORMAL HIGH (ref 70–99)
Potassium: 4.3 mmol/L (ref 3.5–5.1)
Sodium: 138 mmol/L (ref 135–145)
Total Bilirubin: 0.4 mg/dL (ref 0.3–1.2)
Total Protein: 5.1 g/dL — ABNORMAL LOW (ref 6.5–8.1)

## 2022-01-21 LAB — CBC WITH DIFFERENTIAL/PLATELET
Abs Immature Granulocytes: 0.03 10*3/uL (ref 0.00–0.07)
Basophils Absolute: 0 10*3/uL (ref 0.0–0.1)
Basophils Relative: 0 %
Eosinophils Absolute: 0.2 10*3/uL (ref 0.0–0.5)
Eosinophils Relative: 2 %
HCT: 40.3 % (ref 36.0–46.0)
Hemoglobin: 13.2 g/dL (ref 12.0–15.0)
Immature Granulocytes: 0 %
Lymphocytes Relative: 23 %
Lymphs Abs: 1.8 10*3/uL (ref 0.7–4.0)
MCH: 27.4 pg (ref 26.0–34.0)
MCHC: 32.8 g/dL (ref 30.0–36.0)
MCV: 83.8 fL (ref 80.0–100.0)
Monocytes Absolute: 0.9 10*3/uL (ref 0.1–1.0)
Monocytes Relative: 11 %
Neutro Abs: 5 10*3/uL (ref 1.7–7.7)
Neutrophils Relative %: 64 %
Platelets: 149 10*3/uL — ABNORMAL LOW (ref 150–400)
RBC: 4.81 MIL/uL (ref 3.87–5.11)
RDW: 13.5 % (ref 11.5–15.5)
WBC: 7.9 10*3/uL (ref 4.0–10.5)
nRBC: 0 % (ref 0.0–0.2)

## 2022-01-21 LAB — MAGNESIUM: Magnesium: 1.9 mg/dL (ref 1.7–2.4)

## 2022-01-21 LAB — PHOSPHORUS: Phosphorus: 3.3 mg/dL (ref 2.5–4.6)

## 2022-01-21 MED ORDER — BISACODYL 10 MG RE SUPP: 10.0000 mg | Freq: Every day | RECTAL | Status: DC | PRN

## 2022-01-21 MED ORDER — POTASSIUM CHLORIDE CRYS ER 20 MEQ PO TBCR
40.0000 meq | EXTENDED_RELEASE_TABLET | Freq: Two times a day (BID) | ORAL | Status: DC
  Administered 2022-01-21 – 2022-01-22 (×3): 40 meq via ORAL
  Filled 2022-01-21 (×3): qty 2

## 2022-01-21 MED ORDER — FUROSEMIDE 40 MG PO TABS
40.0000 mg | ORAL_TABLET | Freq: Two times a day (BID) | ORAL | Status: DC
  Administered 2022-01-21 – 2022-01-22 (×3): 40 mg via ORAL
  Filled 2022-01-21 (×3): qty 1

## 2022-01-21 MED ORDER — AMIODARONE HCL 200 MG PO TABS
200.0000 mg | ORAL_TABLET | Freq: Two times a day (BID) | ORAL | Status: DC
  Administered 2022-01-21 – 2022-01-22 (×3): 200 mg via ORAL
  Filled 2022-01-21 (×3): qty 1

## 2022-01-21 MED ORDER — HYALURONIDASE HUMAN 150 UNIT/ML IJ SOLN
150.0000 [IU] | Freq: Once | INTRAMUSCULAR | Status: AC
  Administered 2022-01-21: 150 [IU] via SUBCUTANEOUS
  Filled 2022-01-21: qty 1

## 2022-01-21 NOTE — Assessment & Plan Note (Addendum)
-   Mild this patient is platelet count had dropped from 165 is now 149 yesterday but today is 153 -Continue to monitor for signs and symptoms of bleeding as patient is anticoagulated with apixaban; no overt bleeding noted -Repeat CBC in the a.m.

## 2022-01-21 NOTE — Progress Notes (Addendum)
Progress Note  Patient Name: Kristina Dougherty Date of Encounter: 01/21/2022  [facility] Cardiologist: None NEW  Subjective   Doing well this morning.  Status post TEE cardioversion yesterday to sinus rhythm.  She continues to maintain sinus rhythm on telemetry.  Complains of right arm pain after extravasation of IV with Amio running Inpatient Medications    Scheduled Meds:  apixaban  5 mg Oral BID   furosemide  40 mg Intravenous BID   pantoprazole  40 mg Oral Daily   PARoxetine  20 mg Oral BID   polyethylene glycol  17 g Oral BID   potassium chloride  40 mEq Oral TID   senna-docusate  1 tablet Oral BID   Continuous Infusions:  amiodarone 30 mg/hr (01/21/22 0639)   PRN Meds: acetaminophen **OR** acetaminophen, ALPRAZolam, alum & mag hydroxide-simeth   Vital Signs    Vitals:   01/20/22 2004 01/21/22 0005 01/21/22 0451 01/21/22 0750  BP: 133/67 116/76 111/70 138/80  Pulse: 66 69 63 69  Resp: 15 (!) 23 15 20   Temp: 98.6 F (37 C) 98.3 F (36.8 C) 98.2 F (36.8 C) 98.6 F (37 C)  TempSrc: Oral Oral  Oral  SpO2: 97% 91% 91% 92%  Weight:   80.9 kg   Height:        Intake/Output Summary (Last 24 hours) at 01/21/2022 0813 Last data filed at 01/21/2022 8110 Gross per 24 hour  Intake 1335.5 ml  Output 2200 ml  Net -864.5 ml    Last 3 Weights 01/21/2022 01/20/2022 01/20/2022  Weight (lbs) 178 lb 5.6 oz 177 lb 14.6 oz 178 lb  Weight (kg) 80.9 kg 80.7 kg 80.74 kg      Telemetry    Normal sinus rhythm- Personally Reviewed  ECG    Sinus bradycardia with PACs and nonspecific T wave abnormality- Personally Reviewed  Physical Exam   GEN: Well nourished, well developed in no acute distress HEENT: Normal NECK: No JVD; No carotid bruits LYMPHATICS: No lymphadenopathy CARDIAC:RRR, no murmurs, rubs, gallops RESPIRATORY:  Clear to auscultation without rales, wheezing or rhonchi  ABDOMEN: Soft, non-tender, non-distended MUSCULOSKELETAL:  no LE edema.  Right arm  antecubital fossa and forearm swelling and erythema from IV infiltration SKIN: Warm and dry NEUROLOGIC:  Alert and oriented x 3 PSYCHIATRIC:  Normal affect   Labs    High Sensitivity Troponin:   Recent Labs  Lab 01/17/22 1536 01/17/22 1855  TROPONINIHS 12 14       Chemistry Recent Labs  Lab 01/19/22 0445 01/19/22 2215 01/20/22 0555 01/21/22 0219  NA 140 137 140 138  K 3.3* 3.7 3.6 4.3  CL 98 94* 99 98  CO2 32 31 31 31   GLUCOSE 96 153* 103* 114*  BUN 11 9 9 13   CREATININE 1.26* 1.28* 1.28* 1.32*  CALCIUM 8.4* 8.8* 8.6* 8.7*  PROT 5.1*  --  5.2* 5.1*  ALBUMIN 3.0*  --  3.0* 2.9*  AST 23  --  18 17  ALT 21  --  17 15  ALKPHOS 47  --  49 54  BILITOT 0.6  --  0.5 0.4  GFRNONAA 44* 43* 43* 42*  ANIONGAP 10 12 10 9       Hematology Recent Labs  Lab 01/19/22 0445 01/20/22 0555 01/21/22 0219  WBC 7.8 7.5 7.9  RBC 4.83 4.90 4.81  HGB 13.5 13.9 13.2  HCT 39.9 40.9 40.3  MCV 82.6 83.5 83.8  MCH 28.0 28.4 27.4  MCHC 33.8 34.0 32.8  RDW  13.6 13.4 13.5  PLT 164 165 149*     BNP Recent Labs  Lab 01/17/22 1537 01/19/22 0445  BNP 663.7* 263.3*      DDimer No results for input(s): DDIMER in the last 168 hours.   CHA2DS2-VASc Score = 4  This indicates a 4.8% annual risk of stroke. The patient's score is based upon: CHF History: 0 HTN History: 1 Diabetes History: 0 Stroke History: 0 Vascular Disease History: 0 Age Score: 2 Gender Score: 1   Radiology    DG CHEST PORT 1 VIEW  Result Date: 01/20/2022 CLINICAL DATA:  Shortness of breath EXAM: PORTABLE CHEST 1 VIEW COMPARISON:  Chest radiograph 1 day prior FINDINGS: Cardiomediastinal silhouette is grossly stable. There is a moderate size left pleural effusion with adjacent left basilar opacity, unchanged. The left upper lung remains well-aerated. The right lung is clear. There is no significant right effusion. There is no pneumothorax. The bones are stable. IMPRESSION: Unchanged moderate-sized left pleural  effusion with adjacent left basilar opacity. Electronically Signed   By: Valetta Mole M.D.   On: 01/20/2022 08:21   ECHO TEE  Result Date: 01/20/2022    TRANSESOPHOGEAL ECHO REPORT   Patient Name:   Kristina Dougherty Date of Exam: 01/20/2022 Medical Rec #:  [id number]         Height:       62.0 in Accession #:    [id number]        Weight:       178.0 lb Date of Birth:  May 14, 1946         BSA:          1.819 m Patient Age:    76 years          BP:           132/94 mmHg Patient Gender: F                 HR:           147 bpm. Exam Location:  Inpatient Procedure: 2D Echo, Cardiac Doppler and Color Doppler Indications:     I48.0 Paroxysmal atrial fibrillation  History:         Patient has prior history of Echocardiogram examinations, most                  recent 01/18/2022. Risk Factors:Hypertension.  Sonographer:     Darlina Sicilian RDCS Referring Phys:  [id number] Tami Lin [facility] Diagnosing Phys: [name] MD PROCEDURE: After discussion of the risks and benefits of a TEE, an informed consent was obtained from the patient. TEE procedure time was 6 minutes. The transesophogeal probe was passed without difficulty through the esophogus of the patient. Imaged were  obtained with the patient in a left lateral decubitus position. Sedation performed by different physician. The patient was monitored while under deep sedation. Anesthestetic sedation was provided intravenously by Anesthesiology: 194.91mg  of Propofol. Image quality was good. The patient's vital signs; including heart rate, blood pressure, and oxygen saturation; remained stable throughout the procedure. The patient developed no complications during the procedure. A successful direct current cardioversion was performed at 200 joules with 1 attempt. IMPRESSIONS  1. Left ventricular ejection fraction, by estimation, is 40 to 45%. The left ventricle has mildly decreased function. The left ventricle demonstrates global hypokinesis.  2. Right ventricular systolic  function is normal. The right ventricular size is normal.  3. Left atrial size was moderately dilated. No left atrial/left atrial appendage thrombus was detected.  4. Right atrial size was moderately dilated.  5. A small pericardial effusion is present. The pericardial effusion is circumferential.  6. The mitral valve is normal in structure. Mild mitral valve regurgitation. No evidence of mitral stenosis.  7. Tricuspid valve regurgitation is moderate.  8. The aortic valve is normal in structure. Aortic valve regurgitation is not visualized. No aortic stenosis is present.  9. The inferior vena cava is normal in size with greater than 50% respiratory variability, suggesting right atrial pressure of 3 mmHg. 10. Evidence of atrial level shunting detected by color flow Doppler. There is a small patent foramen ovale with predominantly left to right shunting across the atrial septum. Conclusion(s)/Recommendation(s): No LA/LAA thrombus identified. Successful cardioversion performed with restoration of normal sinus rhythm. FINDINGS  Left Ventricle: Left ventricular ejection fraction, by estimation, is 40 to 45%. The left ventricle has mildly decreased function. The left ventricle demonstrates global hypokinesis. The left ventricular internal cavity size was normal in size. There is  no left ventricular hypertrophy. Right Ventricle: The right ventricular size is normal. No increase in right ventricular wall thickness. Right ventricular systolic function is normal. Left Atrium: Left atrial size was moderately dilated. No left atrial/left atrial appendage thrombus was detected. Right Atrium: Right atrial size was moderately dilated. Pericardium: A small pericardial effusion is present. The pericardial effusion is circumferential. Mitral Valve: The mitral valve is normal in structure. Mild mitral valve regurgitation. No evidence of mitral valve stenosis. Tricuspid Valve: The tricuspid valve is normal in structure. Tricuspid  valve regurgitation is moderate . No evidence of tricuspid stenosis. Aortic Valve: The aortic valve is normal in structure. Aortic valve regurgitation is not visualized. No aortic stenosis is present. Pulmonic Valve: The pulmonic valve was normal in structure. Pulmonic valve regurgitation is not visualized. No evidence of pulmonic stenosis. Aorta: The aortic root is normal in size and structure. Venous: The inferior vena cava is normal in size with greater than 50% respiratory variability, suggesting right atrial pressure of 3 mmHg. IAS/Shunts: Evidence of atrial level shunting detected by color flow Doppler. A small patent foramen ovale is detected with predominantly left to right shunting across the atrial septum.   AORTA Ao Asc diam: 2.80 cm [name] MD Electronically signed by [name] MD Signature Date/Time: 01/20/2022/3:21:48 PM    Final     Cardiac Studies   2D echo 01/18/2022 IMPRESSIONS    1. Left ventricular ejection fraction, by estimation, is 40 to 45%. The  left ventricle has mildly decreased function. The left ventricle  demonstrates global hypokinesis. Left ventricular diastolic parameters are  indeterminate.   2. Right ventricular systolic function is normal. The right ventricular  size is mildly enlarged. There is normal pulmonary artery systolic  pressure. The estimated right ventricular systolic pressure is 02.6 mmHg.   3. The pericardial effusion is circumferential. There is no evidence of  cardiac tamponade.   4. The mitral valve is normal in structure. Mild mitral valve  regurgitation. No evidence of mitral stenosis.   5. Tricuspid valve regurgitation is moderate.   6. The aortic valve is normal in structure. Aortic valve regurgitation is  not visualized. No aortic stenosis is present.   7. The inferior vena cava is dilated in size with <50% respiratory  variability, suggesting right atrial pressure of 15 mmHg.   Comparison(s): Prior images reviewed side by side. The  left ventricular  function is worsened.   Patient Profile     76 y.o. female with a hx of A-fib  on Eliquis, hypertension, anxiety, depression, GERD, tobacco use who is being seen  for the evaluation of atrial fibrillation and heart failure at the request of Dr Shela Leff.   Assessment & Plan    Atrial Fibrillation with RVR -Her A-fib was diagnosed this January 2023, when she is admitted from 1/8-1/10, started on apixaban and Toprol -Has had recurrence twice now, status post 2 unsuccessful cardioversions on 01/09/2022 and 01/17/2022.   -started on amiodarone in the outpatient setting with oral load.   -presented this admit with acute CHF and afib with RVR -s/p DCCV in ER this admit but went back into afib -TSH is normal -now on IV Amio reload -Status post TEE/DCCV yesterday -She continues to maintain normal sinus rhythm on exam and telemetry -Potassium 4.3 and magnesium 1.9 today -Continue apixaban 5 mg twice daily. -Change IV amiodarone to amiodarone 200 mg twice daily -follow-up in A-fib clinic next week  2.   Pericardial Effusion -New pericardial effusion seen by emergency department physician at bedside POCUS which is developed since 12/05/2021 with the last echo.  Unclear source.  No signs or symptoms of tamponade at this time.   - echo this admit showed trivial pericardial effusion    3.  Acute combined systolic/diastolic CHF -EF normal on echo from January -EF on echo this admit 40-45% and likely related to tachy mediated process for afib with RVR -BP too soft for BB or ARB/Entresto -she put out 2.2 L yesterday and is 4.59 L net negative since admission -weight up 1 pound from yesterday but down 6 pounds from admission -SCr trending upward at 1.32 today -BNP 663 on admit -Cxray showed left basilar consolidation with pleural effusion -repeat BNP decreased from 664>>263 -I think she is euvolemic at this time and will change IV Lasix to Lasix 40 mg twice daily  4.   Hypokalemia/Hypomagnesemia -K+ 4.3 after repleting -need to keep K>4 -Mag low at 1.9 after replating -Now that we are changing to p.o. Lasix will decrease K-Dur to 40 mEq twice daily -Repeating BMET tomorrow  5.  Right arm swelling -IV infiltrated with Amio extravasation into soft tissues now with selling and erythema -discussed with Pharmacy and they will place orders for Amio extravasation protocol  For questions or updates, please contact [place] Please consult [email or web address] for contact info under        Signed, Fransico Him, MD  01/21/2022, 8:13 AM

## 2022-01-21 NOTE — Progress Notes (Signed)
Progress Note   Patient: Kristina Dougherty RKY:[id number] DOB: [birth year] DOA: 01/17/2022     4 DOS: the patient was seen and examined on 01/21/2022   Brief hospital course: Patient is a 76 year old obese Caucasian female with a past medical history significant for but not limited to atrial fibrillation on anticoagulation with Eliquis, hypertension, anxiety and depression, GERD, tobacco abuse as well as other comorbidities who was diagnosed with new onset atrial fibrillation during her last hospitalization last month and was started on metoprolol 50 mg p.o. twice daily.  She was also started on anticoagulation at that time for CHA2DS2-VASc score of at least 4.  She was seen in the ED on 01/09/2022 for abdominal pain and found to be in A-fib with RVR and underwent successful cardioversion the same day.  Subsequently she followed up at the cardiology clinic 3 days later and was found to be in A-fib with RVR again and started on amiodarone 200 g p.o. twice daily.  She was seen by her PCP yesterday for volume overload and found to be in persistent A-fib with RVR.  She was subsequently then sent to the ED for further evaluation and rate was in the 140s on arrival to the ED.  She had no fever or leukocytosis but her creatinine was elevated at 1.4 up from a baseline of 0.7-0.9.  Her TSH was 5.9 her high sensitive troponins were negative x2.  BNP was elevated 663.  She had a bedside echocardiogram which showed a new small to moderate pericardial effusion without signs of tamponade.  There is no pericardial effusion seen on TTE on 2013 2023.  She was cardioverted in the ED and then converted to normal sinus rhythm and was started on IV diuresis but then subsequently went back to A-fib with RVR.  Cardiology was consulted formally and started the patient on amiodarone drip and recommended to continue diuresis given her volume overload.  She reported increasing shortness of breath last 3 days especially with exertion and  she is taking Lasix every other day.  In the ED she was cardioverted back into normal sinus rhythm but then subsequently back.  Cardiology recommends continuing amiodarone drip and transition to p.o. and then also recommends continuing diuresis with IV Lasix and further work-up for her volume overload.  She remains on the Amiodarone Drip but because of her heart uncontrolled cardiology to be bolusing her with IV amiodarone.  Cardiology is now planning a TEE/DCCV and this was done today.  Cardiology recommended continuing IV Lasix and following renal function carefully.  Cardiology has now transition her to p.o. Lasix.  I had a lengthy discussion about the patient's left-sided pleural effusion and she does not want to pursue a thoracentesis at this time.  She wants to ambulate first and see if she desaturates and then consider thoracentesis or may consider thoracentesis in outpatient setting.  Overnight her amiodarone had extravasation given that her IV infiltrated so cardiology was notified and they ordered the patient the hyaluronidase 150 units subcu.  Her respiratory status is improving but she states that she has not had a bowel movement and so days so we will add a suppository and if still no improvement will try an enema.  Assessment and Plan: * Atrial fibrillation with rapid ventricular response (HCC)- (present on admission) -Cardioverted in the ED and went sinus rhythm but bradycardic with heart rate in the 40s to 50s but unfortunately went back into A Fib With RVR -**Now she was Cardioverted with TEE/DCCV today  and her TEE showed moderately reduced EF with no evidence of left atrial appendage thrombus and her cardioversion was successful with 200 J and times but was noted with brief ectopic atrial rhythms noted as well -Not hypotensive. -C/w Cardiac monitoring.   -Hold home metoprolol and Placed on Amiodarone gtt  With plans to transition to po per Cardiology recc's but given her Uncontrolled  rates she is getting rebolused with Amiodarone; given that her heart has remained in normal sinus rhythm cardiology transitioned her to amiodarone.  Her amiodarone had some extravasation yesterday given her IV infiltration so to be given hyaluronidase 150 units subcu -C/w Anticoagulation with Apixaban 5 mg po BID  Thrombocytopenia (HCC) - Mild this patient is platelet count had dropped from 165 is now 149 -Continue to monitor for signs and symptoms of bleeding as patient is anticoagulated with apixaban; no overt bleeding noted -Repeat CBC in the a.m.  Pleural effusion due to CHF (congestive heart failure) ([place]) -Noted to have a Left Pleural Effusion on Admission CXR -C/w Diuresis per Cardiology and may need a Thoracentesis; she remained on IV 40 twice daily and cardiology has not changed her to 40 mg p.o. twice daily -Repeat CXR today showed "Cardiomegaly. Increased density in the left lower lung fields has not changed significantly suggesting pleural effusion and underlying atelectasis/pneumonia. " -Currently not Hypoxic and feels as if her SOB is improving   Obesity (BMI 62-83.6) -Complicates overall prognosis and care -Estimated body mass index is 33.87 kg/m as calculated from the following:   Height as of this encounter: 5\' 2"  (1.575 m).   Weight as of this encounter: 84 kg.  -Weight Loss and Dietary Counseling given   GERD (gastroesophageal reflux disease) -Continue Pantoprazole 40 mg po Daily   Constipation- (present on admission) -Patient was seen in the ED a week ago for abdominal pain, nausea, vomiting, and constipation.   -CT abdomen pelvis done at that time showing no acute findings.   -Most of her symptoms have now resolved except continues to endorse constipation.   -Abdominal exam benign. -C/w MiraLAX 17 grams po Daily as needed and will make it BID and start Senna-Docusate 1 tab po BID -Given that she still has not had bowel movement yet we will add bisacodyl 10 mg  suppository daily as needed and if not bowel movement by tomorrow we will try an enema  Abnormal thyroid function test- (present on admission) -TSH 5.905 and Free T4 was 1.05 -Likely Subclinical Hypothyroidism   -Repeat Thyroid Function Tests in 4-6 weeks as an outpatient  Hypomagnesemia- (present on admission) -Magnesium replaced again with IV Mag Sulfate 1 grams -Mag Level went from 1.6 -> 1.9 -> 1.7 -> 1.8 -> 1.9 x2  -We will replete with IV mag sulfate 2 g -Continue to Monitor and Replete as Necessary -Repeat Mag Level in the AM  AKI (acute kidney injury) ([place])- (present on admission) -Creatinine 1.42 on admission and BUN/creatinine improved to 13/1.22 yesterday but slightly bumped today and is 11/1.26 -> 9/1.28 -> 9/1.28 has slowly bumped to 13/1.32; Baseline 0.7-0.9. -Continued IV Lasix given for volume overload but now cardiology is transitioning to p.o. Lasix 40 mg p.o. daily.  -BUN/creatinine is relatively stable from yesterday -Avoid nephrotoxic agents/ hold home hydrochlorothiazide and losartan.  Avoid contrast dyes, hypotension and renally dose medications -Continue to monitor renal function and urine output closely -Repeat CMP in a.m.  Acute systolic CHF (congestive heart failure) ([place])- (present on admission) -Likely secondary to uncontrolled A-fib.  BNP 663.   -  Chest x-ray showing new moderate left pleural effusion and mild perihilar interstitial edema.   -TTE done 12/05/2021 showing EF 55 to 51%, diastolic parameters indeterminate, and mild mitral regurgitation.  -TEE done 01/09/2022 showing EF 60 to 65%.  ED physician had spoken to Dr. Harl Bowie from cardiology who recommended diuresis and repeat echocardiogram. -Repeat transthoracic echocardiogram ordered and done and showed "Left ventricular ejection fraction, by estimation, is 40 to 45%. The  left ventricle has mildly decreased function. The left ventricle demonstrates global hypokinesis. Left ventricular diastolic parameters  are  Indeterminate." -Patient was given IV Lasix 40 mg in the ED.   -Cardiology has initiated on IV Lasix 40 mg twice daily now and will continue for today -Continue to monitor intake and output, daily weights.  -Patient is -4,227  mL since admission; admission weight was 185.19 pounds but repeat is improved to 178.35 -Continue Low-sodium diet with fluid restriction. -Further Care per Cardiology   Pericardial effusion- (present on admission) -Informed by ED physician that bedside echo showing new small to moderate pericardial effusion without signs of tamponade.   -There was no pericardial effusion seen on TEE done on 01/09/2022.     -Hypothyroidism could be a possible etiology as TSH is elevated on labs at 5.905 -Routine transthoracic echocardiogram ordered for further evaluation and showed "Trivial pericardial effusion is present. The pericardial effusion is circumferential. There is no evidence of cardiac tamponade." -Checking free T4 level and was 1.05 -Currently no signs of tamponade  Hypokalemia -K+ is now 4.3 -Continue to Monitor and Replete as Necessary -Repeat CMP in the AM   Essential hypertension- (present on admission) -Stable. -Lasix given for diuresis.   -Currently Hold hydrochlorothiazide and losartan given AKI. -Continue to Monitor BP per Protocol; Last BP was 115/72  ANXIETY DEPRESSION- (present on admission) -Continue Paroxetine 20 mg po BID, Alprazolam 0.25-0.5 mg po TID prn  Subjective: Seen and examined at bedside and the patient was doing okay.  Had some arm swelling and pain given that her amiodarone had some extravasation.  She denies any chest pain but continues have constipation and has not had a bowel movement.  I discussed with her about her left-sided pleural effusion she wants to avoid a thoracentesis right now  Physical Exam: Vitals:   01/21/22 0451 01/21/22 0750 01/21/22 1122 01/21/22 1627  BP: 111/70 138/80 126/77 115/72  Pulse: 63 69 65 70  Resp:  15 20 16 17   Temp: 98.2 F (36.8 C) 98.6 F (37 C) 98.4 F (36.9 C) 98.8 F (37.1 C)  TempSrc:  Oral Oral Oral  SpO2: 91% 92% 96% 94%  Weight: 80.9 kg     Height:       Constitutional: WN/WD obese Caucasian female currently no acute distress appears calm Respiratory: Diminished to auscultation bilaterally worse on the left compared to the right with coarse breath sounds and some crackles.  No appreciable wheezing, rales, or rhonchi.  She is not tachypneic or using accessory muscles to breathe and is not wearing any supplemental oxygen via nasal cannula, Cardiovascular: RRR, no murmurs / rubs / gallops. S1 and S2 auscultated.  Has mild 1+ lower extremity edema Abdomen: Soft, non-tender, distended secondary to body habitus. Bowel sounds positive.  GU: Deferred. Musculoskeletal: No clubbing / cyanosis of digits/nails. No joint deformity upper and lower extremities.  Skin: No rashes, lesions, ulcers on limited skin evaluation. No induration; Warm and dry.   Data Reviewed:  I have independently reviewed and interpreted the patient's clinical laboratory data  Patient's  BUNs/creatinine is now 13/1.32 after platelet count is now on the lower side at 145.  Family Communication: No family currently at bedside  Disposition: Status is: Inpatient Remains inpatient appropriate because: She will need cardiac clearance and will need ambulatory home O2 screen prior to discharge  Planned Discharge Destination: Home with Home Health   DVT prophylaxis: Anticoagulant with apixaban   Author: Raiford Noble, DO [facility] 01/21/2022 5:07 PM  For on call review [email or web address].

## 2022-01-21 NOTE — Progress Notes (Signed)
During shift assessment, patient complaint of severe pain to right arm where she previously had amiodarone infusing yesterday,when assess, site is hard and painful to touch. Per report from previous nurse, IV was removed yesterday 2/24 due to pain and bruising at site. Dr Radford Pax made aware while rounding on patient, and she called pharmacy. See new orders from pharmacy.

## 2022-01-21 NOTE — Plan of Care (Signed)
°  Problem: Education: Goal: Ability to demonstrate management of disease process will improve Outcome: Progressing   Problem: Activity: Goal: Risk for activity intolerance will decrease Outcome: Progressing   Problem: Clinical Measurements: Goal: Cardiovascular complication will be avoided Outcome: Progressing

## 2022-01-22 ENCOUNTER — Encounter (HOSPITAL_COMMUNITY): Payer: Self-pay | Admitting: Cardiology

## 2022-01-22 ENCOUNTER — Inpatient Hospital Stay (HOSPITAL_COMMUNITY): Payer: Medicare Other

## 2022-01-22 DIAGNOSIS — D696 Thrombocytopenia, unspecified: Secondary | ICD-10-CM

## 2022-01-22 DIAGNOSIS — K5909 Other constipation: Secondary | ICD-10-CM

## 2022-01-22 LAB — COMPREHENSIVE METABOLIC PANEL
ALT: 12 U/L (ref 0–44)
AST: 17 U/L (ref 15–41)
Albumin: 2.7 g/dL — ABNORMAL LOW (ref 3.5–5.0)
Alkaline Phosphatase: 51 U/L (ref 38–126)
Anion gap: 8 (ref 5–15)
BUN: 12 mg/dL (ref 8–23)
CO2: 33 mmol/L — ABNORMAL HIGH (ref 22–32)
Calcium: 8.8 mg/dL — ABNORMAL LOW (ref 8.9–10.3)
Chloride: 99 mmol/L (ref 98–111)
Creatinine, Ser: 1.34 mg/dL — ABNORMAL HIGH (ref 0.44–1.00)
GFR, Estimated: 41 mL/min — ABNORMAL LOW (ref >60)
Glucose, Bld: 98 mg/dL (ref 70–99)
Potassium: 4.5 mmol/L (ref 3.5–5.1)
Sodium: 140 mmol/L (ref 135–145)
Total Bilirubin: 1 mg/dL (ref 0.3–1.2)
Total Protein: 4.9 g/dL — ABNORMAL LOW (ref 6.5–8.1)

## 2022-01-22 LAB — CBC WITH DIFFERENTIAL/PLATELET
Abs Immature Granulocytes: 0.03 10*3/uL (ref 0.00–0.07)
Basophils Absolute: 0.1 10*3/uL (ref 0.0–0.1)
Basophils Relative: 1 %
Eosinophils Absolute: 0.2 10*3/uL (ref 0.0–0.5)
Eosinophils Relative: 2 %
HCT: 38.4 % (ref 36.0–46.0)
Hemoglobin: 12.5 g/dL (ref 12.0–15.0)
Immature Granulocytes: 0 %
Lymphocytes Relative: 23 %
Lymphs Abs: 1.8 10*3/uL (ref 0.7–4.0)
MCH: 27.4 pg (ref 26.0–34.0)
MCHC: 32.6 g/dL (ref 30.0–36.0)
MCV: 84 fL (ref 80.0–100.0)
Monocytes Absolute: 0.8 10*3/uL (ref 0.1–1.0)
Monocytes Relative: 10 %
Neutro Abs: 4.8 10*3/uL (ref 1.7–7.7)
Neutrophils Relative %: 64 %
Platelets: 153 10*3/uL (ref 150–400)
RBC: 4.57 MIL/uL (ref 3.87–5.11)
RDW: 13.4 % (ref 11.5–15.5)
WBC: 7.6 10*3/uL (ref 4.0–10.5)
nRBC: 0 % (ref 0.0–0.2)

## 2022-01-22 LAB — MAGNESIUM: Magnesium: 1.7 mg/dL (ref 1.7–2.4)

## 2022-01-22 LAB — PHOSPHORUS: Phosphorus: 3.7 mg/dL (ref 2.5–4.6)

## 2022-01-22 MED ORDER — FUROSEMIDE 40 MG PO TABS: 40.0000 mg | ORAL_TABLET | Freq: Every day | ORAL | 2 refills | Status: DC

## 2022-01-22 MED ORDER — AMIODARONE HCL 200 MG PO TABS: 200.0000 mg | ORAL_TABLET | Freq: Two times a day (BID) | ORAL | Status: DC

## 2022-01-22 MED ORDER — POLYETHYLENE GLYCOL 3350 17 G PO PACK: 17.0000 g | PACK | Freq: Every day | ORAL | 0 refills | Status: AC

## 2022-01-22 MED ORDER — METOPROLOL TARTRATE 50 MG PO TABS: 50.0000 mg | ORAL_TABLET | Freq: Two times a day (BID) | ORAL | 2 refills | Status: DC

## 2022-01-22 MED ORDER — SENNOSIDES-DOCUSATE SODIUM 8.6-50 MG PO TABS: 1.0000 | ORAL_TABLET | Freq: Every day | ORAL | 0 refills | Status: DC

## 2022-01-22 MED ORDER — LOSARTAN POTASSIUM 50 MG PO TABS: 50.0000 mg | ORAL_TABLET | Freq: Every day | ORAL | Status: DC

## 2022-01-22 MED ORDER — MAGNESIUM SULFATE 2 GM/50ML IV SOLN
2.0000 g | Freq: Once | INTRAVENOUS | Status: AC
  Administered 2022-01-22: 2 g via INTRAVENOUS
  Filled 2022-01-22: qty 50

## 2022-01-22 MED ORDER — POTASSIUM CHLORIDE CRYS ER 20 MEQ PO TBCR: 40.0000 meq | EXTENDED_RELEASE_TABLET | Freq: Every day | ORAL | Status: DC

## 2022-01-22 MED ORDER — POTASSIUM CHLORIDE CRYS ER 20 MEQ PO TBCR: 20.0000 meq | EXTENDED_RELEASE_TABLET | Freq: Every day | ORAL | Status: DC

## 2022-01-22 MED ORDER — FUROSEMIDE 40 MG PO TABS: 40.0000 mg | ORAL_TABLET | Freq: Every day | ORAL | Status: DC

## 2022-01-22 MED ORDER — GERHARDT'S BUTT CREAM
TOPICAL_CREAM | CUTANEOUS | Status: DC | PRN
  Filled 2022-01-22: qty 1

## 2022-01-22 NOTE — Discharge Summary (Signed)
Physician Discharge Summary   Patient: Kristina Dougherty MRN: [id number] DOB: March 23, 1946  Admit date:     01/17/2022  Discharge date: 01/22/22  Discharge Physician: Raiford Noble, DO   PCP: Susy Frizzle, MD   Recommendations at discharge:   Follow-up with PCP within 1 to 2 weeks and repeat CBC, CMP, mag, Phos within 1 week Follow-up with cardiology A-fib clinic in CHF clinic within 1 to 2 weeks Repeat chest x-ray in 3 to 6 weeks and have PCP and/or cardiology follow-up the moderate pleural effusion on the left side.  If patient continues to get worse we will likely need thoracentesis this can be done outpatient given that she is not symptomatic while hospitalized.  She elected not to have this pursued currently Repeat thyroid function studies in 4 to 6 weeks   Discharge Diagnoses: Principal Problem:   Atrial fibrillation with rapid ventricular response (HCC) Active Problems:   ANXIETY DEPRESSION   Essential hypertension   Hypokalemia   Pericardial effusion   Acute systolic CHF (congestive heart failure) (HCC)   AKI (acute kidney injury) (HCC)   Hypomagnesemia   Abnormal thyroid function test   Constipation   GERD (gastroesophageal reflux disease)   Obesity (BMI 30-39.9)   Pleural effusion due to CHF (congestive heart failure) (HCC)   Thrombocytopenia (HCC)  Resolved Problems:   * No resolved hospital problems. *   Hospital Course: Patient is a 76 year old obese Caucasian female with a past medical history significant for but not limited to atrial fibrillation on anticoagulation with Eliquis, hypertension, anxiety and depression, GERD, tobacco abuse as well as other comorbidities who was diagnosed with new onset atrial fibrillation during her last hospitalization last month and was started on metoprolol 50 mg p.o. twice daily.  She was also started on anticoagulation at that time for CHA2DS2-VASc score of at least 4.  She was seen in the ED on 01/09/2022 for abdominal pain  and found to be in A-fib with RVR and underwent successful cardioversion the same day.  Subsequently she followed up at the cardiology clinic 3 days later and was found to be in A-fib with RVR again and started on amiodarone 200 g p.o. twice daily.  She was seen by her PCP yesterday for volume overload and found to be in persistent A-fib with RVR.  She was subsequently then sent to the ED for further evaluation and rate was in the 140s on arrival to the ED.  She had no fever or leukocytosis but her creatinine was elevated at 1.4 up from a baseline of 0.7-0.9.  Her TSH was 5.9 her high sensitive troponins were negative x2.  BNP was elevated 663.  She had a bedside echocardiogram which showed a new small to moderate pericardial effusion without signs of tamponade.  There is no pericardial effusion seen on TTE on 2013 2023.  She was cardioverted in the ED and then converted to normal sinus rhythm and was started on IV diuresis but then subsequently went back to A-fib with RVR.  Cardiology was consulted formally and started the patient on amiodarone drip and recommended to continue diuresis given her volume overload.  She reported increasing shortness of breath last 3 days especially with exertion and she is taking Lasix every other day.  In the ED she was cardioverted back into normal sinus rhythm but then subsequently back.  Cardiology recommends continuing amiodarone drip and transition to p.o. and then also recommends continuing diuresis with IV Lasix and further work-up for her volume  overload.  She remains on the Amiodarone Drip but because of her heart uncontrolled cardiology to be bolusing her with IV amiodarone.  Cardiology is now planning a TEE/DCCV and this was done today.  Cardiology recommended continuing IV Lasix and following renal function carefully.  Cardiology has now transition her to p.o. Lasix.  I had a lengthy discussion about the patient's left-sided pleural effusion and she does not want to  pursue a thoracentesis at this time.  She wants to ambulate first and see if she desaturates and then consider thoracentesis or may consider thoracentesis in outpatient setting.  Overnight her amiodarone had extravasation given that her IV infiltrated so cardiology was notified and they ordered the patient the hyaluronidase 150 units subcu.  Her respiratory status is improving but she states that she has not had a bowel movement and so days so we will add a suppository and if still no improvement will try an enema.  Patient had a bowel movement yesterday and she is improved.  Cardiology is recommending continuing diuresis with Lasix but have cut down the dose to 40 mg p.o. daily.  Patient did not desaturate on ambulatory home O2 screen and does not want a thoracentesis at this time.  We recommend the patient follow-up with her PCP and her cardiology care following this moderate left-sided pleural effusion and have a thoracentesis done in outpatient setting if necessary.  She did not desaturate and she felt well and is ready to go home as she is medically stable.  Assessment and Plan: * Atrial fibrillation with rapid ventricular response (HCC)- (present on admission) -Cardioverted in the ED and went sinus rhythm but bradycardic with heart rate in the 40s to 50s but unfortunately went back into A Fib With RVR -**Now she was Cardioverted with TEE/DCCV today and her TEE showed moderately reduced EF with no evidence of left atrial appendage thrombus and her cardioversion was successful with 200 J and times but was noted with brief ectopic atrial rhythms noted as well -Not hypotensive. -C/w Cardiac monitoring.   -Hold home metoprolol and Placed on Amiodarone gtt  With plans to transition to po per Cardiology recc's but given her Uncontrolled rates she is getting rebolused with Amiodarone; given that her heart has remained in normal sinus rhythm cardiology transitioned her to amiodarone.  Her amiodarone had some  extravasation yesterday given her IV infiltration so to be given hyaluronidase 150 units subcu -C/w Anticoagulation with Apixaban 5 mg po BID -Patient's right arm is looking better but she still has a 2 cm indurated not from the amiodarone extravasation  Thrombocytopenia (HCC) - Mild this patient is platelet count had dropped from 165 is now 149 yesterday but today is 153 -Continue to monitor for signs and symptoms of bleeding as patient is anticoagulated with apixaban; no overt bleeding noted -Repeat CBC in the a.m.  Pleural effusion due to CHF (congestive heart failure) ([place]) -Noted to have a Left Pleural Effusion on Admission CXR -C/w Diuresis per Cardiology and may need a Thoracentesis; she remained on IV 40 twice daily and cardiology has not changed her to 40 mg p.o. twice daily -Repeat CXR today showed "Cardiomegaly. Increased density in the left lower lung fields has not changed significantly suggesting pleural effusion and underlying atelectasis/pneumonia. " -Currently not Hypoxic and feels as if her SOB is improving  -**The patient will need repeat chest x-ray in 3 to 6 weeks and have this moderate pleural effusion followed and have a thoracentesis done for further evaluation in  outpatient setting with fluid analysis given that she did not want it done here  Obesity (BMI 37-16.9) -Complicates overall prognosis and care -Estimated body mass index is 33.87 kg/m as calculated from the following:   Height as of this encounter: 5\' 2"  (1.575 m).   Weight as of this encounter: 84 kg.  -Weight Loss and Dietary Counseling given   GERD (gastroesophageal reflux disease) -Continue Pantoprazole 40 mg po Daily   Constipation- (present on admission) -Patient was seen in the ED a week ago for abdominal pain, nausea, vomiting, and constipation.   -CT abdomen pelvis done at that time showing no acute findings.   -Most of her symptoms have now resolved except continues to endorse constipation.    -Abdominal exam benign. -C/w MiraLAX 17 grams po Daily as needed and will make it BID and start Senna-Docusate 1 tab po BID -Received a suppository last night and she had a small bowel movement  Abnormal thyroid function test- (present on admission) -TSH 5.905 and Free T4 was 1.05 -Likely Subclinical Hypothyroidism   -Repeat Thyroid Function Tests in 4-6 weeks as an outpatient  Hypomagnesemia- (present on admission) -Magnesium replaced again with IV Mag Sulfate 1 grams -Mag Level went from 1.6 -> 1.9 -> 1.7 -> 1.8 -> 1.9 x2  And was 1.7 -We will replete with IV mag sulfate 2 g prior to D/C -Continue to Monitor and Replete as Necessary -Repeat Mag Level in the AM  AKI (acute kidney injury) ([place])- (present on admission) -Creatinine 1.42 on admission and BUN/creatinine improved to 13/1.22 yesterday but slightly bumped today and is 11/1.26 -> 9/1.28 -> 9/1.28 has slowly bumped to 13/1.32 -> 12/1.34; Baseline 0.7-0.9. -Continued IV Lasix given for volume overload but now cardiology is transitioning to p.o. Lasix 40 mg p.o. daily.  And will continue at discharge -BUN/creatinine is relatively stable from yesterday -Avoid nephrotoxic agents/ hold home hydrochlorothiazide and losartan for now.  Avoid contrast dyes, hypotension and renally dose medications -Continue to monitor renal function and urine output closely -Repeat CMP in a.m.  Acute systolic CHF (congestive heart failure) ([place])- (present on admission) -Likely secondary to uncontrolled A-fib.  BNP 663.   -Chest x-ray showing new moderate left pleural effusion and mild perihilar interstitial edema.   -TTE done 12/05/2021 showing EF 55 to 67%, diastolic parameters indeterminate, and mild mitral regurgitation.  -TEE done 01/09/2022 showing EF 60 to 65%.  ED physician had spoken to Dr. Harl Bowie from cardiology who recommended diuresis and repeat echocardiogram. -Repeat transthoracic echocardiogram ordered and done and showed "Left ventricular  ejection fraction, by estimation, is 40 to 45%. The  left ventricle has mildly decreased function. The left ventricle demonstrates global hypokinesis. Left ventricular diastolic parameters are  Indeterminate." -Beta-blocker is being held given her hypotension and ARB is also being held given AKI -IV Lasix has been changed to p.o. Lasix -Continue to monitor intake and output, daily weights.  -Patient is - 3947 mL since admission; admission weight was 185.19 pounds but repeat is improved to 177.69 -Continue Low-sodium diet with fluid restriction. -Further Care per Cardiology outpatient appointment with them within 1 week   Pericardial effusion- (present on admission) -Informed by ED physician that bedside echo showing new small to moderate pericardial effusion without signs of tamponade.   -There was no pericardial effusion seen on TEE done on 01/09/2022.     -Hypothyroidism could be a possible etiology as TSH is elevated on labs at 5.905 -Routine transthoracic echocardiogram ordered for further evaluation and showed "Trivial pericardial  effusion is present. The pericardial effusion is circumferential. There is no evidence of cardiac tamponade." -Checking free T4 level and was 1.05 -Currently no signs of tamponade  Hypokalemia -K+ is now 4.5 -Continue to Monitor and Replete as Necessary -Repeat CMP in the AM   Essential hypertension- (present on admission) -Stable. -Lasix given for diuresis.  -Currently Hold hydrochlorothiazide and losartan given AKI but will stop HCTZ and Defer to Cardiology when to resume ARB in the outpatient setting  -Continue to Monitor BP per Protocol; Last BP was 121/69  ANXIETY DEPRESSION- (present on admission) -Continue Paroxetine 20 mg po BID, Alprazolam 0.25-0.5 mg po TID prn  Pain control - [place] Controlled Substance Reporting System database was reviewed. and patient was instructed, not to drive, operate heavy machinery, perform activities at  heights, swimming or participation in water activities or provide baby-sitting services while on Pain, Sleep and Anxiety Medications; until their outpatient Physician has advised to do so again. Also recommended to not to take more than prescribed Pain, Sleep and Anxiety Medications.   Consultants: Cardiology Procedures performed: TEE/DCCV  Disposition: Home Diet recommendation:  Discharge Diet Orders (From admission, onward)     Start     Ordered   01/22/22 0000  Diet - low sodium heart healthy        01/22/22 1508           Cardiac diet  DISCHARGE MEDICATION: Allergies as of 01/22/2022       Reactions   Morphine And Related    Severe HA per pt   Nitrofurantoin Nausea And Vomiting        Medication List     STOP taking these medications    amLODipine 10 MG tablet Commonly known as: NORVASC   hydrochlorothiazide 12.5 MG capsule Commonly known as: MICROZIDE   meloxicam 15 MG tablet Commonly known as: MOBIC       TAKE these medications    acetaminophen 500 MG tablet Commonly known as: TYLENOL Take 500 mg by mouth every 6 (six) hours as needed (pain.).   ALPRAZolam 0.5 MG tablet Commonly known as: XANAX Take 1 tablet (0.5 mg total) by mouth 3 (three) times daily as needed for anxiety. TAKE 1 TABLET BY MOUTH THREE TIMES DAILY AS NEEDED FOR ANXIETY OR SLEEP What changed:  how much to take reasons to take this additional instructions   amiodarone 200 MG tablet Commonly known as: PACERONE Take 1 tablet (200 mg total) by mouth 2 (two) times daily.   apixaban 5 MG Tabs tablet Commonly known as: ELIQUIS Take 1 tablet (5 mg total) by mouth 2 (two) times daily.   clobetasol cream 0.05 % Commonly known as: TEMOVATE APPLY ONE APPLICATION TOPICALLY TWO TIMES DAILY What changed:  how much to take how to take this when to take this additional instructions   furosemide 40 MG tablet Commonly known as: Lasix Take 1 tablet (40 mg total) by mouth daily. What  changed: when to take this   losartan 50 MG tablet Commonly known as: COZAAR Take 1 tablet (50 mg total) by mouth daily. Resume when OK with Cards and BP is better Start taking on: January 27, 2022 What changed:  additional instructions These instructions start on January 27, 2022. If you are unsure what to do until then, ask your doctor or other care provider.   metoprolol tartrate 50 MG tablet Commonly known as: LOPRESSOR Take 1 tablet (50 mg total) by mouth 2 (two) times daily. Resume when ok with Cardiology  and BP is improved Start taking on: January 27, 2022 What changed:  additional instructions These instructions start on January 27, 2022. If you are unsure what to do until then, ask your doctor or other care provider.   multivitamin with minerals Tabs tablet Take 1 tablet by mouth daily.   ondansetron 4 MG tablet Commonly known as: ZOFRAN Take 1 tablet (4 mg total) by mouth every 6 (six) hours as needed for nausea.   pantoprazole 40 MG tablet Commonly known as: PROTONIX TAKE 1 TABLET BY MOUTH  DAILY   PARoxetine 20 MG tablet Commonly known as: PAXIL TAKE 1 TABLET BY MOUTH  TWICE DAILY What changed: when to take this   polyethylene glycol 17 g packet Commonly known as: MIRALAX / GLYCOLAX Take 17 g by mouth daily.   potassium chloride SA 20 MEQ tablet Commonly known as: KLOR-CON M Take 1 tablet (20 mEq total) by mouth daily.   senna-docusate 8.6-50 MG tablet Commonly known as: Senokot-S Take 1 tablet by mouth at bedtime.         Discharge Exam: Filed Weights   01/20/22 1023 01/21/22 0451 01/22/22 0436  Weight: 80.7 kg 80.9 kg 80.6 kg   Vitals:   01/22/22 0820 01/22/22 1118  BP: (!) 103/57 121/69  Pulse: 77 71  Resp: 18 19  Temp:  98 F (36.7 C)  SpO2: 93% 94%   Examination: Physical Exam:  Constitutional: WN/WD obese Caucasian female and NAD and appears calm and comfortable Respiratory: Diminished to auscultation bilaterally with coarse breath sounds  worse on the left compared to right and left side had some slight crackles.  She has unlabored breathing and is not wearing any supplemental oxygen via nasal cannula, no wheezing, rales, rhonchi or crackles.  Cardiovascular: RRR, no murmurs / rubs / gallops. S1 and S2 auscultated.  Mild 1+ lower extremity edema Abdomen: Soft, non-tender, distended secondary body habitus. Bowel sounds positive.  GU: Deferred. Musculoskeletal: No clubbing / cyanosis of digits/nails. No joint deformity upper and lower extremities on limited skin evaluation  Condition at discharge: stable  The results of significant diagnostics from this hospitalization (including imaging, microbiology, ancillary and laboratory) are listed below for reference.   Imaging Studies: DG CHEST PORT 1 VIEW  Result Date: 01/22/2022 CLINICAL DATA:  Shortness of breath, pleural effusion EXAM: PORTABLE CHEST 1 VIEW COMPARISON:  Previous studies including the examination of 01/21/2022 FINDINGS: Transverse diameter of heart is increased. Increased density in the left lower lung field obscuring the left hemidiaphragm has not changed. Rest of the lung fields are clear. Right lateral CP angle is clear. There is no pneumothorax. IMPRESSION: No significant interval changes are noted in the left pleural effusion and underlying atelectasis/pneumonia. Electronically Signed   By: Elmer Picker M.D.   On: 01/22/2022 09:42   DG CHEST PORT 1 VIEW  Result Date: 01/21/2022 CLINICAL DATA:  Shortness of breath EXAM: PORTABLE CHEST 1 VIEW COMPARISON:  Previous studies including the examination of 01/20/2022 FINDINGS: Transverse diameter of heart is increased. There is opacification of left lower lung fields obscuring the left hemidiaphragm and blunting of left lateral CP angle. Right lateral CP angle is clear. There is no pneumothorax. Degenerative changes are noted in the left shoulder. IMPRESSION: Cardiomegaly. Increased density in the left lower lung fields  has not changed significantly suggesting pleural effusion and underlying atelectasis/pneumonia. Electronically Signed   By: Elmer Picker M.D.   On: 01/21/2022 09:16   DG CHEST PORT 1 VIEW  Result Date: 01/20/2022 CLINICAL  DATA:  Shortness of breath EXAM: PORTABLE CHEST 1 VIEW COMPARISON:  Chest radiograph 1 day prior FINDINGS: Cardiomediastinal silhouette is grossly stable. There is a moderate size left pleural effusion with adjacent left basilar opacity, unchanged. The left upper lung remains well-aerated. The right lung is clear. There is no significant right effusion. There is no pneumothorax. The bones are stable. IMPRESSION: Unchanged moderate-sized left pleural effusion with adjacent left basilar opacity. Electronically Signed   By: Valetta Mole M.D.   On: 01/20/2022 08:21   DG CHEST PORT 1 VIEW  Result Date: 01/19/2022 CLINICAL DATA:  Shortness of breath in a 76 year old female. EXAM: PORTABLE CHEST 1 VIEW COMPARISON:  February 21st of 2023. FINDINGS: EKG leads project over the chest. Persistent LEFT basilar consolidation and pleural effusion obscures LEFT heart border. Cardiomediastinal contours and hilar structures, visualized portions are stable. RIGHT lung is clear.  No sign of pneumothorax. On limited assessment there is no acute skeletal process. IMPRESSION: Persistent LEFT basilar consolidation and moderate LEFT pleural effusion. Electronically Signed   By: Zetta Bills M.D.   On: 01/19/2022 08:06   DG Chest Port 1 View  Result Date: 01/17/2022 CLINICAL DATA:  SOB and palpitations today. Pt had a-fib with cardioversion on Friday. Hx of HTN, A-fib. Shortness of breath, palpitations EXAM: PORTABLE CHEST - 1 VIEW COMPARISON:  12/04/2021 FINDINGS: New moderate left pleural effusion. Atelectasis/consolidation at the left lung base. Mild perihilar interstitial edema suspected. Heart size upper limits normal for technique. Aortic Atherosclerosis (ICD10-170.0). No pneumothorax. Visualized  bones unremarkable. IMPRESSION: 1. New moderate left pleural effusion. 2. Suspect mild perihilar interstitial edema Electronically Signed   By: Lucrezia Europe M.D.   On: 01/17/2022 15:49   ECHOCARDIOGRAM COMPLETE  Result Date: 01/18/2022    ECHOCARDIOGRAM REPORT   Patient Name:   Kristina Dougherty Date of Exam: 01/18/2022 Medical Rec #:  [id number]         Height:       62.0 in Accession #:    [id number]        Weight:       185.2 lb Date of Birth:  1946-08-20         BSA:          1.850 m Patient Age:    41 years          BP:           93/68 mmHg Patient Gender: F                 HR:           106 bpm. Exam Location:  Inpatient Procedure: 2D Echo Indications:    Acute systolic CHF  History:        Patient has prior history of Echocardiogram examinations, most                 recent 12/05/2021. Arrythmias:Atrial Fibrillation; Risk                 Factors:Hypertension.  Sonographer:    Arlyss Gandy Referring Phys: [id number] NTIRWERXV RATHORE  Sonographer Comments: Image acquisition challenging due to patient body habitus. IMPRESSIONS  1. Left ventricular ejection fraction, by estimation, is 40 to 45%. The left ventricle has mildly decreased function. The left ventricle demonstrates global hypokinesis. Left ventricular diastolic parameters are indeterminate.  2. Right ventricular systolic function is normal. The right ventricular size is mildly enlarged. There is normal pulmonary artery systolic pressure. The estimated right ventricular systolic pressure is 32.3  mmHg.  3. The pericardial effusion is circumferential. There is no evidence of cardiac tamponade.  4. The mitral valve is normal in structure. Mild mitral valve regurgitation. No evidence of mitral stenosis.  5. Tricuspid valve regurgitation is moderate.  6. The aortic valve is normal in structure. Aortic valve regurgitation is not visualized. No aortic stenosis is present.  7. The inferior vena cava is dilated in size with <50% respiratory variability, suggesting  right atrial pressure of 15 mmHg. Comparison(s): Prior images reviewed side by side. The left ventricular function is worsened. FINDINGS  Left Ventricle: Left ventricular ejection fraction, by estimation, is 40 to 45%. The left ventricle has mildly decreased function. The left ventricle demonstrates global hypokinesis. The left ventricular internal cavity size was normal in size. There is  no left ventricular hypertrophy. Left ventricular diastolic parameters are indeterminate. Right Ventricle: The right ventricular size is mildly enlarged. No increase in right ventricular wall thickness. Right ventricular systolic function is normal. There is normal pulmonary artery systolic pressure. The tricuspid regurgitant velocity is 2.08  m/s, and with an assumed right atrial pressure of 15 mmHg, the estimated right ventricular systolic pressure is 62.1 mmHg. Left Atrium: Left atrial size was normal in size. Right Atrium: Right atrial size was normal in size. Pericardium: Trivial pericardial effusion is present. The pericardial effusion is circumferential. There is no evidence of cardiac tamponade. Mitral Valve: The mitral valve is normal in structure. Mild mitral valve regurgitation. No evidence of mitral valve stenosis. Tricuspid Valve: The tricuspid valve is normal in structure. Tricuspid valve regurgitation is moderate . No evidence of tricuspid stenosis. Aortic Valve: The aortic valve is normal in structure. Aortic valve regurgitation is not visualized. No aortic stenosis is present. Aortic valve mean gradient measures 1.0 mmHg. Aortic valve peak gradient measures 1.9 mmHg. Aortic valve area, by VTI measures 1.85 cm. Pulmonic Valve: The pulmonic valve was normal in structure. Pulmonic valve regurgitation is trivial. No evidence of pulmonic stenosis. Aorta: The aortic root is normal in size and structure. Venous: The inferior vena cava is dilated in size with less than 50% respiratory variability, suggesting right atrial  pressure of 15 mmHg. IAS/Shunts: No atrial level shunt detected by color flow Doppler.  LEFT VENTRICLE PLAX 2D LVIDd:         3.80 cm   Diastology LVIDs:         3.06 cm   LV e' medial:    7.07 cm/s LV PW:         0.75 cm   LV E/e' medial:  9.8 LV IVS:        0.76 cm   LV e' lateral:   8.70 cm/s LVOT diam:     1.60 cm   LV E/e' lateral: 8.0 LV SV:         24 LV SV Index:   13 LVOT Area:     2.01 cm  RIGHT VENTRICLE RV Basal diam:  3.98 cm RV Mid diam:    2.96 cm RV S prime:     6.96 cm/s TAPSE (M-mode): 1.0 cm LEFT ATRIUM             Index        RIGHT ATRIUM           Index LA diam:        3.70 cm 2.00 cm/m   RA Area:     18.90 cm LA Vol (A2C):   44.4 ml 24.00 ml/m  RA Volume:  52.00 ml  28.11 ml/m LA Vol (A4C):   50.8 ml 27.46 ml/m LA Biplane Vol: 51.5 ml 27.84 ml/m  AORTIC VALVE AV Area (Vmax):    1.87 cm AV Area (Vmean):   1.85 cm AV Area (VTI):     1.85 cm AV Vmax:           69.80 cm/s AV Vmean:          49.500 cm/s AV VTI:            0.128 m AV Peak Grad:      1.9 mmHg AV Mean Grad:      1.0 mmHg LVOT Vmax:         64.80 cm/s LVOT Vmean:        45.600 cm/s LVOT VTI:          0.118 m LVOT/AV VTI ratio: 0.92  AORTA Ao Root diam: 2.80 cm Ao Asc diam:  3.10 cm MITRAL VALVE               TRICUSPID VALVE MV Area (PHT): 5.13 cm    TR Peak grad:   17.3 mmHg MV Decel Time: 148 msec    TR Vmax:        208.00 cm/s MV E velocity: 69.20 cm/s                            SHUNTS                            Systemic VTI:  0.12 m                            Systemic Diam: 1.60 cm [name] MD Electronically signed by [name] MD Signature Date/Time: 01/18/2022/10:45:33 AM    Final    ECHO TEE  Result Date: 01/20/2022    TRANSESOPHOGEAL ECHO REPORT   Patient Name:   Kristina Dougherty Date of Exam: 01/20/2022 Medical Rec #:  [id number]         Height:       62.0 in Accession #:    [id number]        Weight:       178.0 lb Date of Birth:  1945/12/04         BSA:          1.819 m Patient Age:    44 years          BP:            132/94 mmHg Patient Gender: F                 HR:           147 bpm. Exam Location:  Inpatient Procedure: 2D Echo, Cardiac Doppler and Color Doppler Indications:     I48.0 Paroxysmal atrial fibrillation  History:         Patient has prior history of Echocardiogram examinations, most                  recent 01/18/2022. Risk Factors:Hypertension.  Sonographer:     Darlina Sicilian RDCS Referring Phys:  [id number] Tami Lin [facility] Diagnosing Phys: [name] MD PROCEDURE: After discussion of the risks and benefits of a TEE, an informed consent was obtained from the patient. TEE procedure time was 6 minutes. The transesophogeal probe was passed without difficulty  through the esophogus of the patient. Imaged were  obtained with the patient in a left lateral decubitus position. Sedation performed by different physician. The patient was monitored while under deep sedation. Anesthestetic sedation was provided intravenously by Anesthesiology: 194.91mg  of Propofol. Image quality was good. The patient's vital signs; including heart rate, blood pressure, and oxygen saturation; remained stable throughout the procedure. The patient developed no complications during the procedure. A successful direct current cardioversion was performed at 200 joules with 1 attempt. IMPRESSIONS  1. Left ventricular ejection fraction, by estimation, is 40 to 45%. The left ventricle has mildly decreased function. The left ventricle demonstrates global hypokinesis.  2. Right ventricular systolic function is normal. The right ventricular size is normal.  3. Left atrial size was moderately dilated. No left atrial/left atrial appendage thrombus was detected.  4. Right atrial size was moderately dilated.  5. A small pericardial effusion is present. The pericardial effusion is circumferential.  6. The mitral valve is normal in structure. Mild mitral valve regurgitation. No evidence of mitral stenosis.  7. Tricuspid valve regurgitation is moderate.   8. The aortic valve is normal in structure. Aortic valve regurgitation is not visualized. No aortic stenosis is present.  9. The inferior vena cava is normal in size with greater than 50% respiratory variability, suggesting right atrial pressure of 3 mmHg. 10. Evidence of atrial level shunting detected by color flow Doppler. There is a small patent foramen ovale with predominantly left to right shunting across the atrial septum. Conclusion(s)/Recommendation(s): No LA/LAA thrombus identified. Successful cardioversion performed with restoration of normal sinus rhythm. FINDINGS  Left Ventricle: Left ventricular ejection fraction, by estimation, is 40 to 45%. The left ventricle has mildly decreased function. The left ventricle demonstrates global hypokinesis. The left ventricular internal cavity size was normal in size. There is  no left ventricular hypertrophy. Right Ventricle: The right ventricular size is normal. No increase in right ventricular wall thickness. Right ventricular systolic function is normal. Left Atrium: Left atrial size was moderately dilated. No left atrial/left atrial appendage thrombus was detected. Right Atrium: Right atrial size was moderately dilated. Pericardium: A small pericardial effusion is present. The pericardial effusion is circumferential. Mitral Valve: The mitral valve is normal in structure. Mild mitral valve regurgitation. No evidence of mitral valve stenosis. Tricuspid Valve: The tricuspid valve is normal in structure. Tricuspid valve regurgitation is moderate . No evidence of tricuspid stenosis. Aortic Valve: The aortic valve is normal in structure. Aortic valve regurgitation is not visualized. No aortic stenosis is present. Pulmonic Valve: The pulmonic valve was normal in structure. Pulmonic valve regurgitation is not visualized. No evidence of pulmonic stenosis. Aorta: The aortic root is normal in size and structure. Venous: The inferior vena cava is normal in size with greater  than 50% respiratory variability, suggesting right atrial pressure of 3 mmHg. IAS/Shunts: Evidence of atrial level shunting detected by color flow Doppler. A small patent foramen ovale is detected with predominantly left to right shunting across the atrial septum.   AORTA Ao Asc diam: 2.80 cm [name] MD Electronically signed by [name] MD Signature Date/Time: 01/20/2022/3:21:48 PM    Final    CT Angio Chest/Abd/Pel for Dissection W and/or Wo Contrast  Result Date: 01/09/2022 CLINICAL DATA:  Chest pain or back pain, aortic dissection suspected history of a-fib, concern for embolization to abdomen. Abdominal pain, nausea, vomiting EXAM: CT ANGIOGRAPHY CHEST, ABDOMEN AND PELVIS TECHNIQUE: Non-contrast CT of the chest was initially obtained. Multidetector CT imaging through the chest, abdomen  and pelvis was performed using the standard protocol during bolus administration of intravenous contrast. Multiplanar reconstructed images and MIPs were obtained and reviewed to evaluate the vascular anatomy. RADIATION DOSE REDUCTION: This exam was performed according to the departmental dose-optimization program which includes automated exposure control, adjustment of the mA and/or kV according to patient size and/or use of iterative reconstruction technique. CONTRAST:  157mL OMNIPAQUE IOHEXOL 350 MG/ML SOLN COMPARISON:  None. FINDINGS: CTA CHEST FINDINGS Cardiovascular: Heart is mildly enlarged. Aorta normal caliber. No dissection. Scattered aortic calcifications. Mediastinum/Nodes: No mediastinal, hilar, or axillary adenopathy. Trachea and esophagus are unremarkable. Lungs/Pleura: Small to moderate bilateral pleural effusions. Compressive atelectasis in the lower lobes. Musculoskeletal: Chest wall soft tissues are unremarkable. No acute bony abnormality. Review of the MIP images confirms the above findings. CTA ABDOMEN AND PELVIS FINDINGS VASCULAR Aorta: Normal caliber aorta without aneurysm, dissection, vasculitis or  significant stenosis. Scattered aortic atherosclerosis. Celiac: Patent without evidence of aneurysm, dissection, vasculitis or significant stenosis. SMA: Patent without evidence of aneurysm, dissection, vasculitis or significant stenosis. Renals: Both renal arteries are patent without evidence of aneurysm, dissection, vasculitis, fibromuscular dysplasia or significant stenosis. IMA: Patent without evidence of aneurysm, dissection, vasculitis or significant stenosis. Inflow: Patent without evidence of aneurysm, dissection, vasculitis or significant stenosis. Veins: No obvious venous abnormality within the limitations of this arterial phase study. Review of the MIP images confirms the above findings. NON-VASCULAR Hepatobiliary: The scattered hypodensities in the liver, likely cysts, the largest centrally in the right hepatic lobe measuring 2.3 cm. Gallbladder unremarkable. Pancreas: No focal abnormality or ductal dilatation. Spleen: No focal abnormality.  Normal size. Adrenals/Urinary Tract: Adrenal glands and kidneys unremarkable. No hydronephrosis. Urinary bladder unremarkable. Stomach/Bowel: Stomach, large and small bowel grossly unremarkable. Lymphatic: No adenopathy Reproductive: Prior hysterectomy.  No adnexal masses. Other: Small amount of free fluid in the pelvis.  No free air. Musculoskeletal: No acute bony abnormality. Review of the MIP images confirms the above findings. IMPRESSION: No evidence of aortic aneurysm or dissection. Small to moderate bilateral pleural effusions with compressive atelectasis in the lower lobes. Trace free fluid in the pelvis. Otherwise no acute findings in the abdomen or pelvis. Aortic atherosclerosis. Electronically Signed   By: Rolm Baptise M.D.   On: 01/09/2022 03:08    Microbiology: Results for orders placed or performed during the hospital encounter of 01/17/22  Resp Panel by RT-PCR (Flu A&B, Covid) Nasopharyngeal Swab     Status: None   Collection Time: 01/18/22  2:17  PM   Specimen: Nasopharyngeal Swab; Nasopharyngeal(NP) swabs in vial transport medium  Result Value Ref Range Status   SARS Coronavirus 2 by RT PCR NEGATIVE NEGATIVE Final    Comment: (NOTE) SARS-CoV-2 target nucleic acids are NOT DETECTED.  The SARS-CoV-2 RNA is generally detectable in upper respiratory specimens during the acute phase of infection. The lowest concentration of SARS-CoV-2 viral copies this assay can detect is 138 copies/mL. A negative result does not preclude SARS-Cov-2 infection and should not be used as the sole basis for treatment or other patient management decisions. A negative result may occur with  improper specimen collection/handling, submission of specimen other than nasopharyngeal swab, presence of viral mutation(s) within the areas targeted by this assay, and inadequate number of viral copies(<138 copies/mL). A negative result must be combined with clinical observations, patient history, and epidemiological information. The expected result is Negative.  Fact Sheet for Patients:  EntrepreneurPulse.com.au  Fact Sheet for Healthcare Providers:  IncredibleEmployment.be  This test is no t yet approved or cleared  by the [place] and  has been authorized for detection and/or diagnosis of SARS-CoV-2 by FDA under an Emergency Use Authorization (EUA). This EUA will remain  in effect (meaning this test can be used) for the duration of the COVID-19 declaration under Section 564(b)(1) of the Act, 21 U.S.C.section 360bbb-3(b)(1), unless the authorization is terminated  or revoked sooner.       Influenza A by PCR NEGATIVE NEGATIVE Final   Influenza B by PCR NEGATIVE NEGATIVE Final    Comment: (NOTE) The Xpert Xpress SARS-CoV-2/FLU/RSV plus assay is intended as an aid in the diagnosis of influenza from Nasopharyngeal swab specimens and should not be used as a sole basis for treatment. Nasal washings and aspirates are  unacceptable for Xpert Xpress SARS-CoV-2/FLU/RSV testing.  Fact Sheet for Patients: EntrepreneurPulse.com.au  Fact Sheet for Healthcare Providers: IncredibleEmployment.be  This test is not yet approved or cleared by the [place] FDA and has been authorized for detection and/or diagnosis of SARS-CoV-2 by FDA under an Emergency Use Authorization (EUA). This EUA will remain in effect (meaning this test can be used) for the duration of the COVID-19 declaration under Section 564(b)(1) of the Act, 21 U.S.C. section 360bbb-3(b)(1), unless the authorization is terminated or revoked.  Performed at [facility], [place] [address] [place], [place] [address]     Labs: CBC: Recent Labs  Lab 01/17/22 1536 01/19/22 0445 01/20/22 0555 01/21/22 0219 01/22/22 0259  WBC 9.5 7.8 7.5 7.9 7.6  NEUTROABS  --  5.0 4.8 5.0 4.8  HGB 15.0 13.5 13.9 13.2 12.5  HCT 47.1* 39.9 40.9 40.3 38.4  MCV 85.5 82.6 83.5 83.8 84.0  PLT 242 164 165 149* 786   Basic Metabolic Panel: Recent Labs  Lab 01/19/22 0445 01/19/22 2215 01/20/22 0555 01/20/22 0818 01/21/22 0219 01/22/22 0259  NA 140 137 140  --  138 140  K 3.3* 3.7 3.6  --  4.3 4.5  CL 98 94* 99  --  98 99  CO2 32 31 31  --  31 33*  GLUCOSE 96 153* 103*  --  114* 98  BUN 11 9 9   --  13 12  CREATININE 1.26* 1.28* 1.28*  --  1.32* 1.34*  CALCIUM 8.4* 8.8* 8.6*  --  8.7* 8.8*  MG 1.7 1.7 1.8 1.9 1.9 1.7  PHOS 3.0  --  3.3  --  3.3 3.7   Liver Function Tests: Recent Labs  Lab 01/19/22 0445 01/20/22 0555 01/21/22 0219 01/22/22 0259  AST 23 18 17 17   ALT 21 17 15 12   ALKPHOS 47 49 54 51  BILITOT 0.6 0.5 0.4 1.0  PROT 5.1* 5.2* 5.1* 4.9*  ALBUMIN 3.0* 3.0* 2.9* 2.7*   CBG: No results for input(s): GLUCAP in the last 168 hours.  Discharge time spent: greater than 30 minutes.  Signed: Raiford Noble, DO [facility] 01/22/2022

## 2022-01-22 NOTE — Progress Notes (Addendum)
Progress Note  Patient Name: BECCA BAYNE Date of Encounter: 01/22/2022  [facility] Cardiologist: None NEW  Subjective   Status post TEE cardioversion  to sinus rhythm.    Maintaining NSR on tele  Denies any chest pain or SOB  Inpatient Medications    Scheduled Meds:  amiodarone  200 mg Oral BID   apixaban  5 mg Oral BID   furosemide  40 mg Oral BID   pantoprazole  40 mg Oral Daily   PARoxetine  20 mg Oral BID   polyethylene glycol  17 g Oral BID   potassium chloride  40 mEq Oral BID   senna-docusate  1 tablet Oral BID   Continuous Infusions:   PRN Meds: acetaminophen **OR** acetaminophen, ALPRAZolam, alum & mag hydroxide-simeth, bisacodyl   Vital Signs    Vitals:   01/22/22 0056 01/22/22 0436 01/22/22 0712 01/22/22 0820  BP: 118/64 119/71 109/65 (!) 103/57  Pulse: 72 69 67 77  Resp: 19 16 18 18   Temp: 98.3 F (36.8 C) 97.6 F (36.4 C) 97.9 F (36.6 C)   TempSrc: Oral Oral Oral   SpO2: 93% 92% 93% 93%  Weight:  80.6 kg    Height:        Intake/Output Summary (Last 24 hours) at 01/22/2022 1031 Last data filed at 01/22/2022 9562 Gross per 24 hour  Intake 700 ml  Output 400 ml  Net 300 ml    Last 3 Weights 01/22/2022 01/21/2022 01/20/2022  Weight (lbs) 177 lb 11.1 oz 178 lb 5.6 oz 177 lb 14.6 oz  Weight (kg) 80.6 kg 80.9 kg 80.7 kg      Telemetry    NSR- Personally Reviewed  ECG    No new EKG to review- Personally Reviewed  Physical Exam   GEN: Well nourished, well developed in no acute distress HEENT: Normal NECK: No JVD; No carotid bruits LYMPHATICS: No lymphadenopathy CARDIAC:RRR, no murmurs, rubs, gallops RESPIRATORY:  Clear to auscultation without rales, wheezing or rhonchi  ABDOMEN: Soft, non-tender, non-distended MUSCULOSKELETAL:  No BLE edema; No deformity  SKIN: erythema and swelling on right forearm due to amio extravasation with 2cm knot but improved from yesterday NEUROLOGIC:  Alert and oriented x 3 PSYCHIATRIC:   Normal affect   Labs    High Sensitivity Troponin:   Recent Labs  Lab 01/17/22 1536 01/17/22 1855  TROPONINIHS 12 14       Chemistry Recent Labs  Lab 01/20/22 0555 01/21/22 0219 01/22/22 0259  NA 140 138 140  K 3.6 4.3 4.5  CL 99 98 99  CO2 31 31 33*  GLUCOSE 103* 114* 98  BUN 9 13 12   CREATININE 1.28* 1.32* 1.34*  CALCIUM 8.6* 8.7* 8.8*  PROT 5.2* 5.1* 4.9*  ALBUMIN 3.0* 2.9* 2.7*  AST 18 17 17   ALT 17 15 12   ALKPHOS 49 54 51  BILITOT 0.5 0.4 1.0  GFRNONAA 43* 42* 41*  ANIONGAP 10 9 8       Hematology Recent Labs  Lab 01/20/22 0555 01/21/22 0219 01/22/22 0259  WBC 7.5 7.9 7.6  RBC 4.90 4.81 4.57  HGB 13.9 13.2 12.5  HCT 40.9 40.3 38.4  MCV 83.5 83.8 84.0  MCH 28.4 27.4 27.4  MCHC 34.0 32.8 32.6  RDW 13.4 13.5 13.4  PLT 165 149* 153     BNP Recent Labs  Lab 01/17/22 1537 01/19/22 0445  BNP 663.7* 263.3*      DDimer No results for input(s): DDIMER in the last 168 hours.  CHA2DS2-VASc Score = 4  This indicates a 4.8% annual risk of stroke. The patient's score is based upon: CHF History: 0 HTN History: 1 Diabetes History: 0 Stroke History: 0 Vascular Disease History: 0 Age Score: 2 Gender Score: 1   Radiology    DG CHEST PORT 1 VIEW  Result Date: 01/22/2022 CLINICAL DATA:  Shortness of breath, pleural effusion EXAM: PORTABLE CHEST 1 VIEW COMPARISON:  Previous studies including the examination of 01/21/2022 FINDINGS: Transverse diameter of heart is increased. Increased density in the left lower lung field obscuring the left hemidiaphragm has not changed. Rest of the lung fields are clear. Right lateral CP angle is clear. There is no pneumothorax. IMPRESSION: No significant interval changes are noted in the left pleural effusion and underlying atelectasis/pneumonia. Electronically Signed   By: Elmer Picker M.D.   On: 01/22/2022 09:42   DG CHEST PORT 1 VIEW  Result Date: 01/21/2022 CLINICAL DATA:  Shortness of breath EXAM:  PORTABLE CHEST 1 VIEW COMPARISON:  Previous studies including the examination of 01/20/2022 FINDINGS: Transverse diameter of heart is increased. There is opacification of left lower lung fields obscuring the left hemidiaphragm and blunting of left lateral CP angle. Right lateral CP angle is clear. There is no pneumothorax. Degenerative changes are noted in the left shoulder. IMPRESSION: Cardiomegaly. Increased density in the left lower lung fields has not changed significantly suggesting pleural effusion and underlying atelectasis/pneumonia. Electronically Signed   By: Elmer Picker M.D.   On: 01/21/2022 09:16   ECHO TEE  Result Date: 01/20/2022    TRANSESOPHOGEAL ECHO REPORT   Patient Name:   ANNETH BRUNELL Date of Exam: 01/20/2022 Medical Rec #:  [id number]         Height:       62.0 in Accession #:    [id number]        Weight:       178.0 lb Date of Birth:  04-Aug-1946         BSA:          1.819 m Patient Age:    76 years          BP:           132/94 mmHg Patient Gender: F                 HR:           147 bpm. Exam Location:  Inpatient Procedure: 2D Echo, Cardiac Doppler and Color Doppler Indications:     I48.0 Paroxysmal atrial fibrillation  History:         Patient has prior history of Echocardiogram examinations, most                  recent 01/18/2022. Risk Factors:Hypertension.  Sonographer:     Darlina Sicilian RDCS Referring Phys:  [id number] Tami Lin [facility] Diagnosing Phys: [name] MD PROCEDURE: After discussion of the risks and benefits of a TEE, an informed consent was obtained from the patient. TEE procedure time was 6 minutes. The transesophogeal probe was passed without difficulty through the esophogus of the patient. Imaged were  obtained with the patient in a left lateral decubitus position. Sedation performed by different physician. The patient was monitored while under deep sedation. Anesthestetic sedation was provided intravenously by Anesthesiology: 194.91mg  of Propofol. Image  quality was good. The patient's vital signs; including heart rate, blood pressure, and oxygen saturation; remained stable throughout the procedure. The patient developed no complications during the procedure. A successful  direct current cardioversion was performed at 200 joules with 1 attempt. IMPRESSIONS  1. Left ventricular ejection fraction, by estimation, is 40 to 45%. The left ventricle has mildly decreased function. The left ventricle demonstrates global hypokinesis.  2. Right ventricular systolic function is normal. The right ventricular size is normal.  3. Left atrial size was moderately dilated. No left atrial/left atrial appendage thrombus was detected.  4. Right atrial size was moderately dilated.  5. A small pericardial effusion is present. The pericardial effusion is circumferential.  6. The mitral valve is normal in structure. Mild mitral valve regurgitation. No evidence of mitral stenosis.  7. Tricuspid valve regurgitation is moderate.  8. The aortic valve is normal in structure. Aortic valve regurgitation is not visualized. No aortic stenosis is present.  9. The inferior vena cava is normal in size with greater than 50% respiratory variability, suggesting right atrial pressure of 3 mmHg. 10. Evidence of atrial level shunting detected by color flow Doppler. There is a small patent foramen ovale with predominantly left to right shunting across the atrial septum. Conclusion(s)/Recommendation(s): No LA/LAA thrombus identified. Successful cardioversion performed with restoration of normal sinus rhythm. FINDINGS  Left Ventricle: Left ventricular ejection fraction, by estimation, is 40 to 45%. The left ventricle has mildly decreased function. The left ventricle demonstrates global hypokinesis. The left ventricular internal cavity size was normal in size. There is  no left ventricular hypertrophy. Right Ventricle: The right ventricular size is normal. No increase in right ventricular wall thickness. Right  ventricular systolic function is normal. Left Atrium: Left atrial size was moderately dilated. No left atrial/left atrial appendage thrombus was detected. Right Atrium: Right atrial size was moderately dilated. Pericardium: A small pericardial effusion is present. The pericardial effusion is circumferential. Mitral Valve: The mitral valve is normal in structure. Mild mitral valve regurgitation. No evidence of mitral valve stenosis. Tricuspid Valve: The tricuspid valve is normal in structure. Tricuspid valve regurgitation is moderate . No evidence of tricuspid stenosis. Aortic Valve: The aortic valve is normal in structure. Aortic valve regurgitation is not visualized. No aortic stenosis is present. Pulmonic Valve: The pulmonic valve was normal in structure. Pulmonic valve regurgitation is not visualized. No evidence of pulmonic stenosis. Aorta: The aortic root is normal in size and structure. Venous: The inferior vena cava is normal in size with greater than 50% respiratory variability, suggesting right atrial pressure of 3 mmHg. IAS/Shunts: Evidence of atrial level shunting detected by color flow Doppler. A small patent foramen ovale is detected with predominantly left to right shunting across the atrial septum.   AORTA Ao Asc diam: 2.80 cm [name] MD Electronically signed by [name] MD Signature Date/Time: 01/20/2022/3:21:48 PM    Final     Cardiac Studies   2D echo 01/18/2022 IMPRESSIONS    1. Left ventricular ejection fraction, by estimation, is 40 to 45%. The  left ventricle has mildly decreased function. The left ventricle  demonstrates global hypokinesis. Left ventricular diastolic parameters are  indeterminate.   2. Right ventricular systolic function is normal. The right ventricular  size is mildly enlarged. There is normal pulmonary artery systolic  pressure. The estimated right ventricular systolic pressure is 43.3 mmHg.   3. The pericardial effusion is circumferential. There is no  evidence of  cardiac tamponade.   4. The mitral valve is normal in structure. Mild mitral valve  regurgitation. No evidence of mitral stenosis.   5. Tricuspid valve regurgitation is moderate.   6. The aortic valve is normal in  structure. Aortic valve regurgitation is  not visualized. No aortic stenosis is present.   7. The inferior vena cava is dilated in size with <50% respiratory  variability, suggesting right atrial pressure of 15 mmHg.   Comparison(s): Prior images reviewed side by side. The left ventricular  function is worsened.   Patient Profile     76 y.o. female with a hx of A-fib on Eliquis, hypertension, anxiety, depression, GERD, tobacco use who is being seen  for the evaluation of atrial fibrillation and heart failure at the request of Dr Shela Leff.   Assessment & Plan    Atrial Fibrillation with RVR -Her A-fib was diagnosed this January 2023, when she is admitted from 1/8-1/10, started on apixaban and Toprol -Has had recurrence twice now, status post 2 unsuccessful cardioversions on 01/09/2022 and 01/17/2022.   -started on amiodarone in the outpatient setting with oral load.   -presented this admit with acute CHF and afib with RVR -s/p DCCV in ER this admit but went back into afib -TSH is normal -Status post TEE/DCCV this admit -She continues to maintain normal sinus rhythm on exam and telemetry -Potassium 4.5 and magnesium 1.7 today (repleting this am) -Continue apixaban 5 mg twice daily. -continue Amio 200mg  BID -follow-up in A-fib clinic next week  2.   Pericardial Effusion -New pericardial effusion seen by emergency department physician at bedside POCUS which is developed since 12/05/2021 with the last echo.  Unclear source.  No signs or symptoms of tamponade at this time.   - echo this admit showed trivial pericardial effusion    3.  Acute combined systolic/diastolic CHF -EF normal on echo from January -EF on echo this admit 40-45% and likely related to  tachy mediated process for afib with RVR -BP too soft for BB or ARB/Entresto -she is 1 L net negative since admission but appears incomplete for past 24hr. -weight down 1 pound from yesterday but down 8 pounds from admission -SCr trending slightly increased from 1.28>>1.34 -BNP 663 on admit -Cxray showed left basilar consolidation with pleural effusion -repeat BNP decreased from 664>>263 -appears euvolemic on exam today -decrease to Lasix 40mg  PO daily  4.  Hypokalemia/Hypomagnesemia -K+ 4.5 after repleting -Mag low at 1.7 despite getting 2gm MagSO4 yesterday and getting another 2gm IV Mag today (goal > 2) -decrease Kdur back to 18meq daily (home dose when on lasix 40mg  daily) -Repeating BMET tomorrow  5.  Right arm swelling -IV infiltrated with Amio extravasation into soft tissues now with selling and erythema -discussed with Pharmacy and now s/p Amio extravasation protocol -less painful and swollen than yesterday but still erythematous with a 2cm knot  For questions or updates, please contact [facility] Please consult [email or web address] for contact info under        Signed, Fransico Him, MD  01/22/2022, 10:31 AM

## 2022-01-22 NOTE — Progress Notes (Signed)
SATURATION QUALIFICATIONS: (This note is used to comply with regulatory documentation for home oxygen)  Patient Saturations on Room Air at Rest = 93%  Patient Saturations on Room Air while Ambulating = 91%    Please briefly explain why patient needs home oxygen: pt's oxygen saturation while ambulation was 91-94%. Pt stated feeling somewhat short of breath. Purse lip breathing education give with teach back. Pt did not required supplemental oxygen while ambulating.

## 2022-01-23 ENCOUNTER — Telehealth: Payer: Self-pay | Admitting: *Deleted

## 2022-01-23 ENCOUNTER — Other Ambulatory Visit: Payer: Self-pay | Admitting: Family Medicine

## 2022-01-23 ENCOUNTER — Telehealth: Payer: Self-pay

## 2022-01-23 DIAGNOSIS — I4891 Unspecified atrial fibrillation: Secondary | ICD-10-CM

## 2022-01-23 DIAGNOSIS — Z748 Other problems related to care provider dependency: Secondary | ICD-10-CM

## 2022-01-23 NOTE — Telephone Encounter (Signed)
Transition Care Management Follow-up Telephone Call Date of discharge and from where: 01/22/22 [facility] How have you been since you were released from the hospital? Pt states she is feeling better, still tired. Any questions or concerns? No  Items Reviewed: Did the pt receive and understand the discharge instructions provided? Yes  Medications obtained and verified? Yes  Other? No  Any new allergies since your discharge? No  Dietary orders reviewed? Yes Do you have support at home? Yes   Home Care and Equipment/Supplies: Were home health services ordered? not applicable If so, what is the name of the agency? N/A  Has the agency set up a time to come to the patient's home? not applicable Were any new equipment or medical supplies ordered?  No What is the name of the medical supply agency? N/A Were you able to get the supplies/equipment? not applicable Do you have any questions related to the use of the equipment or supplies? No  Functional Questionnaire: (I = Independent and D = Dependent) ADLs: I  Bathing/Dressing- I  Meal Prep- I  Eating- I  Maintaining continence- I  Transferring/Ambulation- I  Managing Meds- I  Follow up appointments reviewed:  PCP Hospital f/u appt confirmed? Yes  Scheduled to see Dr. Dennard Schaumann on 02/01/22 @ 3:30. [facility] f/u appt confirmed? Yes  Scheduled to see Roderic Palau, Afib Clinic on 02/02/22 @ 2:30. Are transportation arrangements needed? No  If their condition worsens, is the pt aware to call PCP or go to the Emergency Dept.? Yes Was the patient provided with contact information for the PCP's office or ED? Yes Was to pt encouraged to call back with questions or concerns? Yes

## 2022-01-23 NOTE — Telephone Encounter (Signed)
Pt called stating that she does not have transportation to pick up her Amiodarone, which is ready at her pharmacy. I sent an emergent referral to [facility] to see if they can help her with transportation to get her medications. Thank you.

## 2022-01-23 NOTE — Chronic Care Management (AMB) (Signed)
°  Chronic Care Management   Outreach Note  01/23/2022 Name: Kristina Dougherty MRN: [id number] DOB: 07-Feb-1946  Kristina Dougherty is a 76 y.o. year old female who is a primary care patient of Pickard, Cammie Mcgee, MD. I reached out to Kristina Dougherty by phone today in response to a referral sent by Ms. Kristina Dougherty's primary care provider.  Called Kristina Dougherty RNCM to advise about referral left a message to call back. A telephone outreach was made today spoke with patient to confirm transportation needs for pharmacy.  Patient Kristina Dougherty stated that she has a friend picking up her medication for her and does not need transportation at this time. The patient was referred to the case management team for assistance with care management and care coordination.   [facility]  Direct Dial: [phone]

## 2022-01-24 NOTE — Chronic Care Management (AMB) (Signed)
°  Chronic Care Management   Outreach Note  01/24/2022 Name: Kristina Dougherty MRN: [id number] DOB: [birth year]  Kristina Dougherty is a 76 y.o. year old female who is a primary care patient of Pickard, Cammie Mcgee, MD. I reached out to Kristina Dougherty by phone today in response to a referral sent by Kristina Dougherty's primary care provider.  An unsuccessful telephone outreach was attempted today. The patient was referred to the case management team for assistance with care management and care coordination.   Follow Up Plan: A HIPAA compliant phone message was left for the patient providing contact information and requesting a return call.  The care management team will reach out to the patient again over the next 2 days.  If patient returns call to provider office, please advise to call [place]* at [phone].*  [facility]  Direct Dial: [phone]

## 2022-01-24 NOTE — Telephone Encounter (Signed)
Spoke with patient yesterday afternoon, 01/23/22 and she was able to pick up her medication.

## 2022-01-25 NOTE — Chronic Care Management (AMB) (Signed)
?  Chronic Care Management  ? ?Note ? ?01/25/2022 ?Name: [name] MRN: [id number] DOB: [birth year] ? ?[name] is a 76 y.o. year old female who is a primary care patient of Pickard, Cammie Mcgee, MD. I reached out to Romie Levee by phone today in response to a referral sent by Ms. Maryann Alar Herrle's PCP. ? ?Ms. Slay was given information about Chronic Care Management services today including:  ?CCM service includes personalized support from designated clinical staff supervised by her physician, including individualized plan of care and coordination with other care providers ?24/7 contact phone numbers for assistance for urgent and routine care needs. ?Service will only be billed when office clinical staff spend 20 minutes or more in a month to coordinate care. ?Only one practitioner may furnish and bill the service in a calendar month. ?The patient may stop CCM services at any time (effective at the end of the month) by phone call to the office staff. ?The patient is responsible for co-pay (up to 20% after annual deductible is met) if co-pay is required by the individual health plan.  ? ?Patient agreed to services and verbal consent obtained.  ? ?Follow up plan: ?Telephone appointment with care management team member scheduled for:01/27/22 ? ?Laverda Sorenson  ?Care Guide, Embedded Care Coordination ?[facility] Management  ?Direct Dial: [phone] ? ?

## 2022-01-27 ENCOUNTER — Ambulatory Visit (INDEPENDENT_AMBULATORY_CARE_PROVIDER_SITE_OTHER): Payer: Medicare Other | Admitting: *Deleted

## 2022-01-27 DIAGNOSIS — I4819 Other persistent atrial fibrillation: Secondary | ICD-10-CM

## 2022-01-27 DIAGNOSIS — I5021 Acute systolic (congestive) heart failure: Secondary | ICD-10-CM

## 2022-01-27 DIAGNOSIS — I10 Essential (primary) hypertension: Secondary | ICD-10-CM

## 2022-01-27 NOTE — Patient Instructions (Signed)
Visit Information   Thank you for taking time to visit with me today. Please don't hesitate to contact me if I can be of assistance to you before our next scheduled telephone appointment.  Following are Kristina goals we discussed today:  Take medications as prescribed   Attend all scheduled provider appointments Call pharmacy for medication refills 3-7 days in advance of running out of medications Perform all self care activities independently  Perform IADL's (shopping, preparing meals, housekeeping, managing finances) independently Call provider office for new concerns or questions  call office if I gain more than 2 pounds in one day or 5 pounds in one week keep legs up while sitting track weight in diary use salt in moderation watch for swelling in feet, ankles and legs every day eat more whole grains, fruits and vegetables, lean meats and healthy fats check pulse (heart) rate once a day make a plan to eat healthy keep all lab appointments  Low-Sodium Eating Plan Sodium, which is an element that makes up salt, helps you maintain a healthy balance of fluids in your body. Too much sodium can increase your blood pressure and cause fluid and waste to be held in your body. Your health care provider or dietitian may recommend following this plan if you have high blood pressure (hypertension), kidney disease, liver disease, or heart failure. Eating less sodium can help lower your blood pressure, reduce swelling, and protect your heart, liver, and kidneys. What are tips for following this plan? Reading food labels Kristina Nutrition Facts label lists Kristina amount of sodium in one serving of Kristina food. If you eat more than one serving, you must multiply Kristina listed amount of sodium by Kristina number of servings. Choose foods with less than 140 mg of sodium per serving. Avoid foods with 300 mg of sodium or more per serving. Shopping  Look for lower-sodium products, often labeled as "low-sodium" or "no salt  added." Always check Kristina sodium content, even if foods are labeled as "unsalted" or "no salt added." Buy fresh foods. Avoid canned foods and pre-made or frozen meals. Avoid canned, cured, or processed meats. Buy breads that have less than 80 mg of sodium per slice. Cooking  Eat more home-cooked food and less restaurant, buffet, and fast food. Avoid adding salt when cooking. Use salt-free seasonings or herbs instead of table salt or sea salt. Check with your health care provider or pharmacist before using salt substitutes. Cook with plant-based oils, such as canola, sunflower, or olive oil. Meal planning When eating at a restaurant, ask that your food be prepared with less salt or no salt, if possible. Avoid dishes labeled as brined, pickled, cured, smoked, or made with soy sauce, miso, or teriyaki sauce. Avoid foods that contain MSG (monosodium glutamate). MSG is sometimes added to [place] food, bouillon, and some canned foods. Make meals that can be grilled, baked, poached, roasted, or steamed. These are generally made with less sodium. General information Most people on this plan should limit their sodium intake to 1,500-2,000 mg (milligrams) of sodium each day. What foods should I eat? Fruits Fresh, frozen, or canned fruit. Fruit juice. Vegetables Fresh or frozen vegetables. "No salt added" canned vegetables. "No salt added" tomato sauce and paste. Low-sodium or reduced-sodium tomato and vegetable juice. Grains Low-sodium cereals, including oats, puffed wheat and rice, and shredded wheat. Low-sodium crackers. Unsalted rice. Unsalted pasta. Low-sodium bread. Whole-grain breads and whole-grain pasta. Meats and other proteins Fresh or frozen (no salt added) meat, poultry, seafood, and  fish. Low-sodium canned tuna and salmon. Unsalted nuts. Dried peas, beans, and lentils without added salt. Unsalted canned beans. Eggs. Unsalted nut butters. Dairy Milk. Soy milk. Cheese that is naturally low  in sodium, such as ricotta cheese, fresh mozzarella, or Swiss cheese. Low-sodium or reduced-sodium cheese. Cream cheese. Yogurt. Seasonings and condiments Fresh and dried herbs and spices. Salt-free seasonings. Low-sodium mustard and ketchup. Sodium-free salad dressing. Sodium-free light mayonnaise. Fresh or refrigerated horseradish. Lemon juice. Vinegar. Other foods Homemade, reduced-sodium, or low-sodium soups. Unsalted popcorn and pretzels. Low-salt or salt-free chips. Kristina items listed above may not be a complete list of foods and beverages you can eat. Contact a dietitian for more information. What foods should I avoid? Vegetables Sauerkraut, pickled vegetables, and relishes. Olives. [place] fries. Onion rings. Regular canned vegetables (not low-sodium or reduced-sodium). Regular canned tomato sauce and paste (not low-sodium or reduced-sodium). Regular tomato and vegetable juice (not low-sodium or reduced-sodium). Frozen vegetables in sauces. Grains Instant hot cereals. Bread stuffing, pancake, and biscuit mixes. Croutons. Seasoned rice or pasta mixes. Noodle soup cups. Boxed or frozen macaroni and cheese. Regular salted crackers. Self-rising flour. Meats and other proteins Meat or fish that is salted, canned, smoked, spiced, or pickled. Precooked or cured meat, such as sausages or meat loaves. Berniece Salines. Ham. Pepperoni. Hot dogs. Corned beef. Chipped beef. Salt pork. Jerky. Pickled herring. Anchovies and sardines. Regular canned tuna. Salted nuts. Dairy Processed cheese and cheese spreads. Hard cheeses. Cheese curds. Blue cheese. Feta cheese. String cheese. Regular cottage cheese. Buttermilk. Canned milk. Fats and oils Salted butter. Regular margarine. Ghee. Bacon fat. Seasonings and condiments Onion salt, garlic salt, seasoned salt, table salt, and sea salt. Canned and packaged gravies. Worcestershire sauce. Tartar sauce. Barbecue sauce. Teriyaki sauce. Soy sauce, including reduced-sodium. Steak  sauce. Fish sauce. Oyster sauce. Cocktail sauce. Horseradish that you find on Kristina shelf. Regular ketchup and mustard. Meat flavorings and tenderizers. Bouillon cubes. Hot sauce. Pre-made or packaged marinades. Pre-made or packaged taco seasonings. Relishes. Regular salad dressings. Salsa. Other foods Salted popcorn and pretzels. Corn chips and puffs. Potato and tortilla chips. Canned or dried soups. Pizza. Frozen entrees and pot pies. Kristina items listed above may not be a complete list of foods and beverages you should avoid. Contact a dietitian for more information. Summary Eating less sodium can help lower your blood pressure, reduce swelling, and protect your heart, liver, and kidneys. Most people on this plan should limit their sodium intake to 1,500-2,000 mg (milligrams) of sodium each day. Canned, boxed, and frozen foods are high in sodium. Restaurant foods, fast foods, and pizza are also very high in sodium. You also get sodium by adding salt to food. Try to cook at home, eat more fresh fruits and vegetables, and eat less fast food and canned, processed, or prepared foods. This information is not intended to replace advice given to you by your health care provider. Make sure you discuss any questions you have with your health care provider. Document Revised: 12/19/2019 Document Reviewed: 10/15/2019 Elsevier Patient Education  2022 [place]. Atrial Fibrillation Atrial fibrillation is a type of irregular or rapid heartbeat (arrhythmia). In atrial fibrillation, Kristina top part of Kristina heart (atria) beats in an irregular pattern. This makes Kristina heart unable to pump blood normally and effectively. Kristina goal of treatment is to prevent blood clots from forming, control your heart rate, or restore your heartbeat to a normal rhythm. If this condition is not treated, it can cause serious problems, such as a weakened heart  muscle (cardiomyopathy) or a stroke. What are Kristina causes? This condition is often  caused by medical conditions that damage Kristina heart's electrical system. These include: High blood pressure (hypertension). This is Kristina most common cause. Certain heart problems or conditions, such as heart failure, coronary artery disease, heart valve problems, or heart surgery. Diabetes. Overactive thyroid (hyperthyroidism). Obesity. Chronic kidney disease. In some cases, Kristina cause of this condition is not known. What increases Kristina risk? This condition is more likely to develop in: Older people. People who smoke. Athletes who do endurance exercise. People who have a family history of atrial fibrillation. Men. People who use drugs. People who drink a lot of alcohol. People who have lung conditions, such as emphysema, pneumonia, or COPD. People who have obstructive sleep apnea. What are Kristina signs or symptoms? Symptoms of this condition include: A feeling that your heart is racing or beating irregularly. Discomfort or pain in your chest. Shortness of breath. Sudden light-headedness or weakness. Tiring easily during exercise or activity. Fatigue. Syncope (fainting). Sweating. In some cases, there are no symptoms. How is this diagnosed? Your health care provider may detect atrial fibrillation when taking your pulse. If detected, this condition may be diagnosed with: An electrocardiogram (ECG) to check electrical signals of Kristina heart. An ambulatory cardiac monitor to record your heart's activity for a few days. A transthoracic echocardiogram (TTE) to create pictures of your heart. A transesophageal echocardiogram (TEE) to create even closer pictures of your heart. A stress test to check your blood supply while you exercise. Imaging tests, such as a CT scan or chest X-ray. Blood tests. How is this treated? Treatment depends on underlying conditions and how you feel when you experience atrial fibrillation. This condition may be treated with: Medicines to prevent blood clots or to  treat heart rate or heart rhythm problems. Electrical cardioversion to reset Kristina heart's rhythm. A pacemaker to correct abnormal heart rhythm. Ablation to remove Kristina heart tissue that sends abnormal signals. Left atrial appendage closure to seal Kristina area where blood clots can form. In some cases, underlying conditions will be treated. Follow these instructions at home: Medicines Take over-Kristina counter and prescription medicines only as told by your health care provider. Do not take any new medicines without talking to your health care provider. If you are taking blood thinners: Talk with your health care provider before you take any medicines that contain aspirin or NSAIDs, such as ibuprofen. These medicines increase your risk for dangerous bleeding. Take your medicine exactly as told, at Kristina same time every day. Avoid activities that could cause injury or bruising, and follow instructions about how to prevent falls. Wear a medical alert bracelet or carry a card that lists what medicines you take. Lifestyle   Do not use any products that contain nicotine or tobacco, such as cigarettes, e-cigarettes, and chewing tobacco. If you need help quitting, ask your health care provider. Eat heart-healthy foods. Talk with a dietitian to make an eating plan that is right for you. Exercise regularly as told by your health care provider. Do not drink alcohol. Lose weight if you are overweight. Do not use drugs, including cannabis. General instructions If you have obstructive sleep apnea, manage your condition as told by your health care provider. Do not use diet pills unless your health care provider approves. Diet pills can make heart problems worse. Keep all follow-up visits as told by your health care provider. This is important. Contact a health care provider if you: Notice a  change in Kristina rate, rhythm, or strength of your heartbeat. Are taking a blood thinner and you notice more bruising. Tire  more easily when you exercise or do heavy work. Have a sudden change in weight. Get help right away if you have:  Chest pain, abdominal pain, sweating, or weakness. Trouble breathing. Side effects of blood thinners, such as blood in your vomit, stool, or urine, or bleeding that cannot stop. Any symptoms of a stroke. "BE FAST" is an easy way to remember Kristina main warning signs of a stroke: B - Balance. Signs are dizziness, sudden trouble walking, or loss of balance. E - Eyes. Signs are trouble seeing or a sudden change in vision. F - Face. Signs are sudden weakness or numbness of Kristina face, or Kristina face or eyelid drooping on one side. A - Arms. Signs are weakness or numbness in an arm. This happens suddenly and usually on one side of Kristina body. S - Speech. Signs are sudden trouble speaking, slurred speech, or trouble understanding what people say. T - Time. Time to call emergency services. Write down what time symptoms started. Other signs of a stroke, such as: A sudden, severe headache with no known cause. Nausea or vomiting. Seizure. These symptoms may represent a serious problem that is an emergency. Do not wait to see if Kristina symptoms will go away. Get medical help right away. Call your local emergency services (911 in [place] Do not drive yourself to Kristina hospital. Summary Atrial fibrillation is a type of irregular or rapid heartbeat (arrhythmia). Symptoms include a feeling that your heart is beating fast or irregularly. You may be given medicines to prevent blood clots or to treat heart rate or heart rhythm problems. Get help right away if you have signs or symptoms of a stroke. Get help right away if you cannot catch your breath or have chest pain or pressure. This information is not intended to replace advice given to you by your health care provider. Make sure you discuss any questions you have with your health care provider. Document Revised: 05/07/2019 Document Reviewed:  05/07/2019 Elsevier Patient Education  [place]. Heart Failure Action Plan A heart failure action plan helps you understand what to do when you have symptoms of heart failure. Your action plan is a color-coded plan that lists Kristina symptoms to watch for and indicates what actions to take. If you have symptoms in Kristina red zone, you need medical care right away. If you have symptoms in Kristina yellow zone, you are having problems. If you have symptoms in Kristina green zone, you are doing well. Follow Kristina plan that was created by you and your health care provider. Review your plan each time you visit your health care provider. Red zone These signs and symptoms mean you should get medical help right away: You have trouble breathing when resting. You have a dry cough that is getting worse. You have swelling or pain in your legs or abdomen that is getting worse. You suddenly gain more than 2-3 lb (0.9-1.4 kg) in 24 hours, or more than 5 lb (2.3 kg) in a week. This amount may be more or less depending on your condition. You have trouble staying awake or you feel confused. You have chest pain. You do not have an appetite. You pass out. You have worsening sadness or depression. If you have any of these symptoms, call your local emergency services (911 in [place] right away. Do not drive yourself to Kristina  hospital. Yellow zone These signs and symptoms mean your condition may be getting worse and you should make some changes: You have trouble breathing when you are active, or you need to sleep with your head raised on extra pillows to help you breathe. You have swelling in your legs or abdomen. You gain 2-3 lb (0.9-1.4 kg) in 24 hours, or 5 lb (2.3 kg) in a week. This amount may be more or less depending on your condition. You get tired easily. You have trouble sleeping. You have a dry cough. If you have any of these symptoms: Contact your health care provider within Kristina next day. Your health care  provider may adjust your medicines. Green zone These signs mean you are doing well and can continue what you are doing: You do not have shortness of breath. You have very little swelling or no new swelling. Your weight is stable (no gain or loss). You have a normal activity level. You do not have chest pain or any other new symptoms. Follow these instructions at home: Take over-Kristina-counter and prescription medicines only as told by your health care provider. Weigh yourself daily. Your target weight is __________ lb (__________ kg). Call your health care provider if you gain more than __________ lb (__________ kg) in 24 hours, or more than __________ lb (__________ kg) in a week. Health care provider name: _____________________________________________________ Health care provider phone number: _____________________________________________________ Eat a heart-healthy diet. Work with a diet and nutrition specialist (dietitian) to create an eating plan that is best for you. Keep all follow-up visits. This is important. Where to find more information American Heart Association: www.heart.org Summary A heart failure action plan helps you understand what to do when you have symptoms of heart failure. Follow Kristina action plan that was created by you and your health care provider. Get help right away if you have any symptoms in Kristina red zone. This information is not intended to replace advice given to you by your health care provider. Make sure you discuss any questions you have with your health care provider. Document Revised: 06/28/2020 Document Reviewed: 06/28/2020 Elsevier Patient Education  2022 [employer]. take medicine as prescribed check blood pressure 3 times per week choose a place to take my blood pressure (home, clinic or office, retail store) write blood pressure results in a log or diary take blood pressure log to all doctor appointments take medications for blood pressure exactly  as prescribed Look over education mailed- Heart Failure Action Plan, Atrial Fibrillation Action Plan and Low Sodium diet Call RN care manager for any questions at [phone] Follow up with primary care doctor on 3/8 and cardiologist on 02/02/22  Our next appointment is by telephone on 02/27/22 at 130 pm  Please call Kristina care guide team at [phone] if you need to cancel or reschedule your appointment.   If you are experiencing a Mental Health or [place] or need someone to talk to, please call Kristina Suicide and Crisis Lifeline: 988 call Kristina [place] National Suicide Prevention Lifeline: 425-236-2632 or TTY: 763 673 6821 TTY 4371165295) to talk to a trained counselor call 1-800-273-TALK (toll free, 24 hour hotline) go to [facility] [address] [place] [phone]) call 911   Following is a copy of your full care plan:  Care Plan : RN Care Manager Plan of Care  Updates made by Kassie Mends, RN since 01/27/2022 12:00 AM     Problem: No plan of care established for management of  chronic disease state  (Atrial Fibrillation, CHF, HTN)   Priority: High     Long-Range Goal: Development of plan of care for chronic disease management  (Atrial Fibrillation, CHF, HTN)   Start Date: 01/27/2022  Expected End Date: 07/26/2022  Priority: High  Note:   Current Barriers:  Knowledge Deficits related to plan of care for management of Atrial Fibrillation, CHF, and HTN  No Advanced Directives in place- pt declines information Patient reports she lives alone, has friend that lives nearby that transports her to appointments and to pickup medications and groceries, pt reports she has a car and drives but is not driving at present until she speaks with primary care provider at next week's appointment on 02/01/22.  Pt reports she has blood pressure cuff but does not check and will ask her friend to assist to check blood pressure on occasion, has scale  and does weigh daily with ranges 164-168 pounds. Pt hospitalized 01/17/22 diagnosis atrial fibrillation, CHF exacerbation with pleural effusion left side. Pt reports she has no needs for social worker or pharmacist at this time.  Pt reports she has all medications and taking as prescribed, states in Kristina future she may switch to a pharmacy that delivers but states " I'm not gonna do this right now"    RNCM Clinical Goal(s):  Patient will verbalize understanding of plan for management of Atrial Fibrillation, CHF, and HTN as evidenced by patient report, review of EHR and  through collaboration with RN Care manager, provider, and care team.   Interventions: 1:1 collaboration with primary care provider regarding development and update of comprehensive plan of care as evidenced by provider attestation and co-signature Inter-disciplinary care team collaboration (see longitudinal plan of care) Evaluation of current treatment plan related to  self management and patient's adherence to plan as established by provider   AFIB Interventions: (Status:  New goal. and Goal on track:  Yes.) Long Term Goal   Counseled on increased risk of stroke due to Afib and benefits of anticoagulation for stroke prevention Reviewed importance of adherence to anticoagulant exactly as prescribed Counseled on importance of regular laboratory monitoring as prescribed Afib action plan reviewed   Heart Failure Interventions:  (Status:  New goal. and Goal on track:  Yes.) Long Term Goal Basic overview and discussion of pathophysiology of Heart Failure reviewed Provided education on low sodium diet Reviewed Heart Failure Action Plan in depth and provided written copy Assessed need for readable accurate scales in home Provided education about placing scale on hard, flat surface Advised patient to weigh each morning after emptying bladder Discussed importance of daily weight and advised patient to weigh and record daily Screening  for signs and symptoms of depression related to chronic disease state  Assessed social determinant of health barriers   Hypertension Interventions:  (Status:  New goal. and Goal on track:  Yes.) Long Term Goal Last practice recorded BP readings:  BP Readings from Last 3 Encounters:  01/22/22 121/69  01/17/22 (!) 124/98  01/12/22 94/68  Most recent eGFR/CrCl:  Lab Results  Component Value Date   EGFR 72 12/16/2021    No components found for: CRCL  Evaluation of current treatment plan related to hypertension self management and patient's adherence to plan as established by provider Reviewed medications with patient and discussed importance of compliance Discussed plans with patient for ongoing care management follow up and provided patient with direct contact information for care management team Discussed complications of poorly controlled blood pressure such as heart  disease, stroke, circulatory complications, vision complications, kidney impairment, sexual dysfunction   Patient Goals/Self-Care Activities: Take medications as prescribed   Attend all scheduled provider appointments Call pharmacy for medication refills 3-7 days in advance of running out of medications Perform all self care activities independently  Perform IADL's (shopping, preparing meals, housekeeping, managing finances) independently Call provider office for new concerns or questions  call office if I gain more than 2 pounds in one day or 5 pounds in one week keep legs up while sitting track weight in diary use salt in moderation watch for swelling in feet, ankles and legs every day eat more whole grains, fruits and vegetables, lean meats and healthy fats - check pulse (heart) rate once a day - make a plan to eat healthy - keep all lab appointments - take medicine as prescribed check blood pressure 3 times per week choose a place to take my blood pressure (home, clinic or office, retail store) write blood  pressure results in a log or diary take blood pressure log to all doctor appointments take medications for blood pressure exactly as prescribed Look over education mailed- Heart Failure Action Plan, Atrial Fibrillation Action Plan and Low Sodium diet Call RN care manager for any questions at [phone] Follow up with primary care doctor on 3/8 and cardiologist on 02/02/22       Consent to CCM Services: Ms. Theissen was given information about Chronic Care Management services including:  CCM service includes personalized support from designated clinical staff supervised by her physician, including individualized plan of care and coordination with other care providers 24/7 contact phone numbers for assistance for urgent and routine care needs. Service will only be billed when office clinical staff spend 20 minutes or more in a month to coordinate care. Only one practitioner may furnish and bill Kristina service in a calendar month. Kristina patient may stop CCM services at any time (effective at Kristina end of Kristina month) by phone call to Kristina office staff. Kristina patient will be responsible for cost sharing (co-pay) of up to 20% of Kristina service fee (after annual deductible is met).  Patient agreed to services and verbal consent obtained.   Kristina patient verbalized understanding of instructions, educational materials, and care plan provided today and agreed to receive a mailed copy of patient instructions, educational materials, and care plan.   Telephone follow up appointment with care management team member scheduled for:  02/27/22

## 2022-01-27 NOTE — Chronic Care Management (AMB) (Signed)
Chronic Care Management   CCM RN Visit Note  01/27/2022 Name: Kristina Dougherty MRN: [id number] DOB: 09-28-1946  Subjective: Kristina Dougherty is a 76 y.o. year old female who is a primary care patient of Pickard, Cammie Mcgee, MD. The care management team was consulted for assistance with disease management and care coordination needs.    Engaged with patient by telephone for initial visit in response to provider referral for case management and/or care coordination services.   Consent to Services:  The patient was given the following information about Chronic Care Management services today, agreed to services, and gave verbal consent: 1. CCM service includes personalized support from designated clinical staff supervised by the primary care provider, including individualized plan of care and coordination with other care providers 2. 24/7 contact phone numbers for assistance for urgent and routine care needs. 3. Service will only be billed when office clinical staff spend 20 minutes or more in a month to coordinate care. 4. Only one practitioner may furnish and bill the service in a calendar month. 5.The patient may stop CCM services at any time (effective at the end of the month) by phone call to the office staff. 6. The patient will be responsible for cost sharing (co-pay) of up to 20% of the service fee (after annual deductible is met). Patient agreed to services and consent obtained.  Patient agreed to services and verbal consent obtained.   Assessment: Review of patient past medical history, allergies, medications, health status, including review of consultants reports, laboratory and other test data, was performed as part of comprehensive evaluation and provision of chronic care management services.   SDOH (Social Determinants of Health) assessments and interventions performed:  SDOH Interventions    Flowsheet Row Most Recent Value  SDOH Interventions   Food Insecurity Interventions  Intervention Not Indicated  Transportation Interventions Intervention Not Indicated        CCM Care Plan  Allergies  Allergen Reactions   Morphine And Related     Severe HA per pt   Nitrofurantoin Nausea And Vomiting    Outpatient Encounter Medications as of 01/27/2022  Medication Sig Note   acetaminophen (TYLENOL) 500 MG tablet Take 500 mg by mouth every 6 (six) hours as needed (pain.).    ALPRAZolam (XANAX) 0.5 MG tablet Take 1 tablet (0.5 mg total) by mouth 3 (three) times daily as needed for anxiety. TAKE 1 TABLET BY MOUTH THREE TIMES DAILY AS NEEDED FOR ANXIETY OR SLEEP (Patient taking differently: Take 0.25-0.5 mg by mouth 3 (three) times daily as needed for anxiety or sleep.)    amiodarone (PACERONE) 200 MG tablet Take 1 tablet (200 mg total) by mouth 2 (two) times daily.    apixaban (ELIQUIS) 5 MG TABS tablet Take 1 tablet (5 mg total) by mouth 2 (two) times daily.    clobetasol cream (TEMOVATE) 7.62 % APPLY ONE APPLICATION TOPICALLY TWO TIMES DAILY (Patient taking differently: Apply 1 application topically See admin instructions. APPLY ONE APPLICATION TOPICALLY TWO TIMES DAILY prn)    furosemide (LASIX) 40 MG tablet Take 1 tablet (40 mg total) by mouth daily.    losartan (COZAAR) 50 MG tablet Take 1 tablet (50 mg total) by mouth daily. Resume when OK with Cards and BP is better    metoprolol tartrate (LOPRESSOR) 50 MG tablet Take 1 tablet (50 mg total) by mouth 2 (two) times daily. Resume when ok with Cardiology and BP is improved    ondansetron (ZOFRAN) 4 MG tablet Take 1 tablet (  4 mg total) by mouth every 6 (six) hours as needed for nausea.    PARoxetine (PAXIL) 20 MG tablet TAKE 1 TABLET BY MOUTH  TWICE DAILY (Patient taking differently: Take 20 mg by mouth in the morning and at bedtime.)    polyethylene glycol (MIRALAX / GLYCOLAX) 17 g packet Take 17 g by mouth daily.    potassium chloride SA (KLOR-CON M) 20 MEQ tablet Take 1 tablet (20 mEq total) by mouth daily.     senna-docusate (SENOKOT-S) 8.6-50 MG tablet Take 1 tablet by mouth at bedtime.    Multiple Vitamin (MULITIVITAMIN WITH MINERALS) TABS Take 1 tablet by mouth daily. (Patient not taking: Reported on 01/27/2022) 01/04/2022: On hold due to afib   pantoprazole (PROTONIX) 40 MG tablet TAKE 1 TABLET BY MOUTH  DAILY (Patient not taking: Reported on 01/27/2022) 01/04/2022: On hold due to afib   No facility-administered encounter medications on file as of 01/27/2022.    Patient Active Problem List   Diagnosis Date Noted   Thrombocytopenia ([place]) 01/21/2022   Pleural effusion due to CHF (congestive heart failure) ([place]) 01/19/2022   Obesity (BMI 30-39.9) 01/18/2022   Pericardial effusion 11/88/6773   Acute systolic CHF (congestive heart failure) ([place]) 01/17/2022   AKI (acute kidney injury) ([place]) 01/17/2022   Hypomagnesemia 01/17/2022   Abnormal thyroid function test 01/17/2022   Constipation 01/17/2022   GERD (gastroesophageal reflux disease) 01/17/2022   Persistent atrial fibrillation (HCC)    Bleeding per rectum 12/15/2021   Change in bowel habit 12/15/2021   Diarrhea 12/15/2021   First degree hemorrhoids 12/15/2021   Flatulence, eructation and gas pain 12/15/2021   Irritable bowel syndrome with diarrhea 12/15/2021   Hypokalemia 12/05/2021   Atrial fibrillation with rapid ventricular response ([place]) 12/04/2021   Physical exam, annual 08/14/2012   C. difficile colitis 73/66/8159   Lichen sclerosus et atrophicus 04/23/2012   HYPERTENSION, MILD 11/25/2009   ARTHRITIS, KNEE 10/07/2009   ACUTE BRONCHITIS 09/21/2009   ARTHRITIS, KNEES, BILATERAL 09/21/2009   UTI 02/18/2009   EDEMA 02/18/2009   BURSITIS, RIGHT SHOULDER 11/04/2008   TOBACCO USE, QUIT 11/04/2008   Essential hypertension 11/14/2007   ANXIETY DEPRESSION 11/06/2007   SCIATICA 11/06/2007    Conditions to be addressed/monitored:Atrial Fibrillation, CHF, and HTN  Care Plan : RN Care Manager Plan of Care  Updates made by Kassie Mends, RN  since 01/27/2022 12:00 AM     Problem: No plan of care established for management of chronic disease state  (Atrial Fibrillation, CHF, HTN)   Priority: High     Long-Range Goal: Development of plan of care for chronic disease management  (Atrial Fibrillation, CHF, HTN)   Start Date: 01/27/2022  Expected End Date: 07/26/2022  Priority: High  Note:   Current Barriers:  Knowledge Deficits related to plan of care for management of Atrial Fibrillation, CHF, and HTN  No Advanced Directives in place- pt declines information Patient reports she lives alone, has friend that lives nearby that transports her to appointments and to pickup medications and groceries, pt reports she has a car and drives but is not driving at present until she speaks with primary care provider at next week's appointment on 02/01/22.  Pt reports she has blood pressure cuff but does not check and will ask her friend to assist to check blood pressure on occasion, has scale and does weigh daily with ranges 164-168 pounds. Pt hospitalized 01/17/22 diagnosis atrial fibrillation, CHF exacerbation with pleural effusion left side. Pt reports she has no needs for  Education officer, museum or pharmacist at this time.  Pt reports she has all medications and taking as prescribed, states in the future she may switch to a pharmacy that delivers but states " I'm not gonna do this right now"    RNCM Clinical Goal(s):  Patient will verbalize understanding of plan for management of Atrial Fibrillation, CHF, and HTN as evidenced by patient report, review of EHR and  through collaboration with RN Care manager, provider, and care team.   Interventions: 1:1 collaboration with primary care provider regarding development and update of comprehensive plan of care as evidenced by provider attestation and co-signature Inter-disciplinary care team collaboration (see longitudinal plan of care) Evaluation of current treatment plan related to  self management and patient's  adherence to plan as established by provider   AFIB Interventions: (Status:  New goal. and Goal on track:  Yes.) Long Term Goal   Counseled on increased risk of stroke due to Afib and benefits of anticoagulation for stroke prevention Reviewed importance of adherence to anticoagulant exactly as prescribed Counseled on importance of regular laboratory monitoring as prescribed Afib action plan reviewed   Heart Failure Interventions:  (Status:  New goal. and Goal on track:  Yes.) Long Term Goal Basic overview and discussion of pathophysiology of Heart Failure reviewed Provided education on low sodium diet Reviewed Heart Failure Action Plan in depth and provided written copy Assessed need for readable accurate scales in home Provided education about placing scale on hard, flat surface Advised patient to weigh each morning after emptying bladder Discussed importance of daily weight and advised patient to weigh and record daily Screening for signs and symptoms of depression related to chronic disease state  Assessed social determinant of health barriers   Hypertension Interventions:  (Status:  New goal. and Goal on track:  Yes.) Long Term Goal Last practice recorded BP readings:  BP Readings from Last 3 Encounters:  01/22/22 121/69  01/17/22 (!) 124/98  01/12/22 94/68  Most recent eGFR/CrCl:  Lab Results  Component Value Date   EGFR 72 12/16/2021    No components found for: CRCL  Evaluation of current treatment plan related to hypertension self management and patient's adherence to plan as established by provider Reviewed medications with patient and discussed importance of compliance Discussed plans with patient for ongoing care management follow up and provided patient with direct contact information for care management team Discussed complications of poorly controlled blood pressure such as heart disease, stroke, circulatory complications, vision complications, kidney impairment,  sexual dysfunction   Patient Goals/Self-Care Activities: Take medications as prescribed   Attend all scheduled provider appointments Call pharmacy for medication refills 3-7 days in advance of running out of medications Perform all self care activities independently  Perform IADL's (shopping, preparing meals, housekeeping, managing finances) independently Call provider office for new concerns or questions  call office if I gain more than 2 pounds in one day or 5 pounds in one week keep legs up while sitting track weight in diary use salt in moderation watch for swelling in feet, ankles and legs every day eat more whole grains, fruits and vegetables, lean meats and healthy fats - check pulse (heart) rate once a day - make a plan to eat healthy - keep all lab appointments - take medicine as prescribed check blood pressure 3 times per week choose a place to take my blood pressure (home, clinic or office, retail store) write blood pressure results in a log or diary take blood pressure log to all  doctor appointments take medications for blood pressure exactly as prescribed Look over education mailed- Heart Failure Action Plan, Atrial Fibrillation Action Plan and Low Sodium diet Call RN care manager for any questions at [phone] Follow up with primary care doctor on 3/8 and cardiologist on 02/02/22       Plan:Telephone follow up appointment with care management team member scheduled for:  02/27/22  Jacqlyn Larsen [facility], BSN RN Case Manager [facility] [phone]

## 2022-01-31 ENCOUNTER — Other Ambulatory Visit: Payer: Self-pay | Admitting: Family Medicine

## 2022-02-01 ENCOUNTER — Inpatient Hospital Stay: Payer: Medicare Other | Admitting: Family Medicine

## 2022-02-01 ENCOUNTER — Telehealth: Payer: Self-pay | Admitting: Family Medicine

## 2022-02-01 ENCOUNTER — Ambulatory Visit (HOSPITAL_COMMUNITY): Payer: Medicare Other | Admitting: Nurse Practitioner

## 2022-02-01 NOTE — Telephone Encounter (Signed)
Patient called to cancel HFU appointment scheduled for this afternoon; states her driver is very sick and she doesn't want to be exposed since she has low immunity.  ? ?Patient states she'd taking her prescribed meds, doing well, and following instructions of hospital provider. Also stated she will go to urgent care if any issues arise, and will call back after driver feeling better (hopefully next week) to reschedule.  ? ?Please advise at [phone]. ?

## 2022-02-01 NOTE — Telephone Encounter (Signed)
FYI. Thanks.

## 2022-02-02 ENCOUNTER — Ambulatory Visit (HOSPITAL_COMMUNITY): Payer: Medicare Other | Admitting: Nurse Practitioner

## 2022-02-02 NOTE — Telephone Encounter (Signed)
LOV 01/17/22 ?Last refill 12/16/21, #30, 0 refills ? ?Please review, thanks! ?

## 2022-02-09 ENCOUNTER — Ambulatory Visit (HOSPITAL_COMMUNITY)
Admission: RE | Admit: 2022-02-09 | Discharge: 2022-02-09 | Disposition: A | Discharge status: Home / Self Care | Payer: Medicare Other | Source: Ambulatory Visit | Attending: Nurse Practitioner | Admitting: Nurse Practitioner

## 2022-02-09 ENCOUNTER — Other Ambulatory Visit: Payer: Self-pay

## 2022-02-09 ENCOUNTER — Ambulatory Visit (INDEPENDENT_AMBULATORY_CARE_PROVIDER_SITE_OTHER): Payer: Medicare Other | Admitting: Family Medicine

## 2022-02-09 ENCOUNTER — Encounter (HOSPITAL_COMMUNITY): Payer: Self-pay | Admitting: Nurse Practitioner

## 2022-02-09 VITALS — BP 114/76 | HR 87 | Ht 62.0 in | Wt 174.0 lb

## 2022-02-09 VITALS — BP 120/82 | HR 62 | Temp 98.2°F | Wt 173.0 lb

## 2022-02-09 DIAGNOSIS — I4819 Other persistent atrial fibrillation: Secondary | ICD-10-CM | POA: Diagnosis not present

## 2022-02-09 DIAGNOSIS — I5021 Acute systolic (congestive) heart failure: Secondary | ICD-10-CM | POA: Diagnosis not present

## 2022-02-09 DIAGNOSIS — Z7901 Long term (current) use of anticoagulants: Secondary | ICD-10-CM | POA: Diagnosis not present

## 2022-02-09 DIAGNOSIS — D6869 Other thrombophilia: Secondary | ICD-10-CM | POA: Diagnosis not present

## 2022-02-09 DIAGNOSIS — I4891 Unspecified atrial fibrillation: Secondary | ICD-10-CM | POA: Diagnosis not present

## 2022-02-09 DIAGNOSIS — I509 Heart failure, unspecified: Secondary | ICD-10-CM | POA: Diagnosis not present

## 2022-02-09 DIAGNOSIS — R946 Abnormal results of thyroid function studies: Secondary | ICD-10-CM | POA: Diagnosis not present

## 2022-02-09 NOTE — Progress Notes (Signed)
? ?Subjective:  ? ? Patient ID: Kristina Dougherty, female    DOB: Sep 10, 1946, 76 y.o.   MRN: [id number] ?Admit date:     01/17/2022  ?Discharge date: 01/22/22  ?Discharge Physician: Raiford Noble, DO  ?  ?PCP: Susy Frizzle, MD  ?  ?Recommendations at discharge:  ?  ?Follow-up with PCP within 1 to 2 weeks and repeat CBC, CMP, mag, Phos within 1 week ?Follow-up with cardiology A-fib clinic in CHF clinic within 1 to 2 weeks ?Repeat chest x-ray in 3 to 6 weeks and have PCP and/or cardiology follow-up the moderate pleural effusion on the left side.  If patient continues to get worse we will likely need thoracentesis this can be done outpatient given that she is not symptomatic while hospitalized.  She elected not to have this pursued currently ?Repeat thyroid function studies in 4 to 6 weeks ? ?  ?Discharge Diagnoses: ?Principal Problem: ?  Atrial fibrillation with rapid ventricular response ([place]) ?Active Problems: ?  ANXIETY DEPRESSION ?  Essential hypertension ?  Hypokalemia ?  Pericardial effusion ?  Acute systolic CHF (congestive heart failure) ([place]) ?  AKI (acute kidney injury) ([place]) ?  Hypomagnesemia ?  Abnormal thyroid function test ?  Constipation ?  GERD (gastroesophageal reflux disease) ?  Obesity (BMI 30-39.9) ?  Pleural effusion due to CHF (congestive heart failure) ([place]) ?  Thrombocytopenia ([place]) ?  ?Resolved Problems: ?  * No resolved hospital problems. * ?  ?  ?Hospital Course: ?Patient is a 76 year old obese Caucasian female with a past medical history significant for but not limited to atrial fibrillation on anticoagulation with Eliquis, hypertension, anxiety and depression, GERD, tobacco abuse as well as other comorbidities who was diagnosed with new onset atrial fibrillation during her last hospitalization last month and was started on metoprolol 50 mg p.o. twice daily.  She was also started on anticoagulation at that time for CHA2DS2-VASc score of at least 4.  She was seen in the ED on 01/09/2022 for  abdominal pain and found to be in A-fib with RVR and underwent successful cardioversion the same day.  Subsequently she followed up at the cardiology clinic 3 days later and was found to be in A-fib with RVR again and started on amiodarone 200 g p.o. twice daily.  She was seen by her PCP yesterday for volume overload and found to be in persistent A-fib with RVR.  She was subsequently then sent to the ED for further evaluation and rate was in the 140s on arrival to the ED.  She had no fever or leukocytosis but her creatinine was elevated at 1.4 up from a baseline of 0.7-0.9.  Her TSH was 5.9 her high sensitive troponins were negative x2.  BNP was elevated 663.  She had a bedside echocardiogram which showed a new small to moderate pericardial effusion without signs of tamponade.  There is no pericardial effusion seen on TTE on 2013 2023.  She was cardioverted in the ED and then converted to normal sinus rhythm and was started on IV diuresis but then subsequently went back to A-fib with RVR.  Cardiology was consulted formally and started the patient on amiodarone drip and recommended to continue diuresis given her volume overload.  She reported increasing shortness of breath last 3 days especially with exertion and she is taking Lasix every other day.  In the ED she was cardioverted back into normal sinus rhythm but then subsequently back.  Cardiology recommends continuing amiodarone drip and transition to p.o.  and then also recommends continuing diuresis with IV Lasix and further work-up for her volume overload. ?  ?She remains on the Amiodarone Drip but because of her heart uncontrolled cardiology to be bolusing her with IV amiodarone.  Cardiology is now planning a TEE/DCCV and this was done today.  Cardiology recommended continuing IV Lasix and following renal function carefully. ?  ?Cardiology has now transition her to p.o. Lasix.  I had a lengthy discussion about the patient's left-sided pleural effusion and she  does not want to pursue a thoracentesis at this time.  She wants to ambulate first and see if she desaturates and then consider thoracentesis or may consider thoracentesis in outpatient setting.  Overnight her amiodarone had extravasation given that her IV infiltrated so cardiology was notified and they ordered the patient the hyaluronidase 150 units subcu.  Her respiratory status is improving but she states that she has not had a bowel movement and so days so we will add a suppository and if still no improvement will try an enema. ?  ?Patient had a bowel movement yesterday and she is improved.  Cardiology is recommending continuing diuresis with Lasix but have cut down the dose to 40 mg p.o. daily.  Patient did not desaturate on ambulatory home O2 screen and does not want a thoracentesis at this time.  We recommend the patient follow-up with her PCP and her cardiology care following this moderate left-sided pleural effusion and have a thoracentesis done in outpatient setting if necessary.  She did not desaturate and she felt well and is ready to go home as she is medically stable. ?  ?Assessment and Plan: ?* Atrial fibrillation with rapid ventricular response ([place])- (present on admission) ?-Cardioverted in the ED and went sinus rhythm but bradycardic with heart rate in the 40s to 50s but unfortunately went back into A Fib With RVR ?-**Now she was Cardioverted with TEE/DCCV today and her TEE showed moderately reduced EF with no evidence of left atrial appendage thrombus and her cardioversion was successful with 200 J and times but was noted with brief ectopic atrial rhythms noted as well ?-Not hypotensive. ?-C/w Cardiac monitoring.   ?-Hold home metoprolol and Placed on Amiodarone gtt  With plans to transition to po per Cardiology recc's but given her Uncontrolled rates she is getting rebolused with Amiodarone; given that her heart has remained in normal sinus rhythm cardiology transitioned her to amiodarone.  Her  amiodarone had some extravasation yesterday given her IV infiltration so to be given hyaluronidase 150 units subcu ?-C/w Anticoagulation with Apixaban 5 mg po BID ?-Patient's right arm is looking better but she still has a 2 cm indurated not from the amiodarone extravasation ?  ?Thrombocytopenia ([place]) ?- Mild this patient is platelet count had dropped from 165 is now 149 yesterday but today is 153 ?-Continue to monitor for signs and symptoms of bleeding as patient is anticoagulated with apixaban; no overt bleeding noted ?-Repeat CBC in the a.m. ?  ?Pleural effusion due to CHF (congestive heart failure) ([place]) ?-Noted to have a Left Pleural Effusion on Admission CXR ?-C/w Diuresis per Cardiology and may need a Thoracentesis; she remained on IV 40 twice daily and cardiology has not changed her to 40 mg p.o. twice daily ?-Repeat CXR today showed "Cardiomegaly. Increased density in the left lower lung fields has not changed significantly suggesting pleural effusion and underlying atelectasis/pneumonia. " ?-Currently not Hypoxic and feels as if her SOB is improving  ?-**The patient will need repeat chest x-ray  in 3 to 6 weeks and have this moderate pleural effusion followed and have a thoracentesis done for further evaluation in outpatient setting with fluid analysis given that she did not want it done here ?  ?Obesity (BMI 30-39.9) ?-Complicates overall prognosis and care ?-Estimated body mass index is 33.87 kg/m? as calculated from the following: ?  Height as of this encounter: '5\' 2"'$  (1.575 m). ?  Weight as of this encounter: 84 kg.  ?-Weight Loss and Dietary Counseling given ?  ?  ?GERD (gastroesophageal reflux disease) ?-Continue Pantoprazole 40 mg po Daily  ?  ?Constipation- (present on admission) ?-Patient was seen in the ED a week ago for abdominal pain, nausea, vomiting, and constipation.   ?-CT abdomen pelvis done at that time showing no acute findings.   ?-Most of her symptoms have now resolved except continues  to endorse constipation.   ?-Abdominal exam benign. ?-C/w MiraLAX 17 grams po Daily as needed and will make it BID and start Senna-Docusate 1 tab po BID ?-Received a suppository last night and she had

## 2022-02-09 NOTE — Progress Notes (Signed)
? ?Primary Care Physician: Susy Frizzle, MD ?Referring Physician: Hospital F/u  ? ? ?Kristina Dougherty is a 76 y.o. female with a h/o HTN, anxiety/depression, that was admitted 12/04/21 to 12/06/21 at [facility] with new onset afib with RVR. She was rate controlled with IV Cardizem and then switched to metoprolol 50 mg bid.  She was also started on eliquis 5 mg bid for a CHA2DS2VASc  score of 4.  ? ?In the clinic on f/u, her EKG showed afib with RVR at 162 bpm. She had gained 10  lbs of fluid. She is having shortness of breath  and she stopped both eliquis and metoprolol as she felt the drugs were making her worse. She feels very full thru her abdomen which effects her appetite and has ++ pedal edema, does not describe PND/orthopnea. Her HCTZ was stopped in the hospital. She is here with her son today. She had a fall last week but was not on anticoagulation at the time.   ? ?F/u in the afib clinic, 01/03/22. She now has had on her scales at home, in her PJ's, around a 10 lb weight loss, since lasix was started last visit  here with clothes, 6 lbs. Since back on metoprolol, she is now running 117-130 bpm in afib  vrs 160bpm last week when I saw her not on metoprolol. She has been taking her eliquis on  a regular basis since last week. She feels improved but still with a lot of fatigue and shortness of breath. Still no PND/orthopnea. We discussed that she has a soft BP limiting up titration o BB. Since she has not been on anticoagulation x 3 weeks, and I am concerned she may have deterioration in her status,   will go ahead and schedule TEE/cardioversion for next Monday, which is next available. She and her son are in agreement. Still remind them to have a low threshold to go to ER if symptoms worsen.  ? ?F/u in afib clinic, 2/16 as she called to office yesterday as she had noted increased pulse rates. I went ahead and started her on amiodarone 200 mg bid as I feared ERAF. Ekg today shows afib at 118 bpm. She actually went  to the ER the night prior to the cardioversion for abdominal pain, n/v. No definite dx. for pain found. She was given a shot for pain slept for several hours and was then rolled around to the endo unit for cardioversion. She felt  better after successful cardioversion and her abdominal  pain has been improved.  ? ?F/u in afib clinic, 02/09/22.   Since I saw her last, she presented to the ED with fluid overload found at  f/u with PCP. Her HR's in the ER was found to be 140 bpm.  She was cardioverted in the ED and then converted to normal sinus rhythm and was started on IV diuresis but then subsequently went back to A-fib with RVR.  Cardiology was consulted formally and started the patient on amiodarone drip and recommended to continue diuresis given her volume overload.  She reported increasing shortness of breath last 3 days especially with exertion and she is taking Lasix every other day ( she was suppose to be taking daily) .  In the ED she was cardioverted back into normal sinus rhythm but then subsequently back to afib. she was cardioverted again on 2/24 but unfortunately, she is back in rate controlled afib again today. She has again cut back on her lasix to QOD  and I encouraged to go back to daily as she can get fluid overloaded easily. She saw her PCP this am with labs and a CXR has been scheduled to f/u on L pleural effusion. She states that she is feeling much improved.  ? ?Today, she denies symptoms of palpitations, chest pain, shortness of breath, orthopnea, PND, lower extremity edema, dizziness, presyncope, syncope, or neurologic sequela. The patient is tolerating medications without difficulties and is otherwise without complaint today.  ? ?Past Medical History:  ?Diagnosis Date  ? Anxiety   ? Arthritis   ? Atrial fibrillation ([place])   ? Deaf, right   ? Depression   ? Hypertension   ? Osteopenia   ? ?Past Surgical History:  ?Procedure Laterality Date  ? ABDOMINAL HYSTERECTOMY    ? APPENDECTOMY    ? BUBBLE  STUDY  01/09/2022  ? Procedure: BUBBLE STUDY;  Surgeon: [name], MD;  Location: [place];  Service: Cardiovascular;;  ? CARDIOVERSION N/A 01/09/2022  ? Procedure: CARDIOVERSION;  Surgeon: [name], MD;  Location: [place];  Service: Cardiovascular;  Laterality: N/A;  ? CARDIOVERSION N/A 01/20/2022  ? Procedure: CARDIOVERSION;  Surgeon: Jerline Pain, MD;  Location: [facility] ENDOSCOPY;  Service: Cardiovascular;  Laterality: N/A;  ? TEE WITHOUT CARDIOVERSION N/A 01/09/2022  ? Procedure: TRANSESOPHAGEAL ECHOCARDIOGRAM (TEE);  Surgeon: [name], MD;  Location: [place];  Service: Cardiovascular;  Laterality: N/A;  ? TEE WITHOUT CARDIOVERSION N/A 01/20/2022  ? Procedure: TRANSESOPHAGEAL ECHOCARDIOGRAM (TEE);  Surgeon: Jerline Pain, MD;  Location: [facility] ENDOSCOPY;  Service: Cardiovascular;  Laterality: N/A;  ? ? ?Current Outpatient Medications  ?Medication Sig Dispense Refill  ? acetaminophen (TYLENOL) 500 MG tablet Take 500 mg by mouth every 6 (six) hours as needed (pain.).    ? ALPRAZolam (XANAX) 0.5 MG tablet TAKE 1 TABLET BY MOUTH THREE TIMES DAILY AS NEEDED FOR ANXIETY OR  SLEEP 30 tablet 0  ? amiodarone (PACERONE) 200 MG tablet Take 1 tablet (200 mg total) by mouth 2 (two) times daily.    ? apixaban (ELIQUIS) 5 MG TABS tablet Take 1 tablet (5 mg total) by mouth 2 (two) times daily. 60 tablet 2  ? clobetasol cream (TEMOVATE) 3.01 % APPLY ONE APPLICATION TOPICALLY TWO TIMES DAILY (Patient taking differently: Apply 1 application. topically See admin instructions. APPLY ONE APPLICATION TOPICALLY TWO TIMES DAILY prn) 30 g 3  ? furosemide (LASIX) 40 MG tablet Take 1 tablet (40 mg total) by mouth daily. 30 tablet 2  ? losartan (COZAAR) 50 MG tablet Take 1 tablet (50 mg total) by mouth daily. Resume when OK with Cards and BP is better    ? metoprolol tartrate (LOPRESSOR) 50 MG tablet Take 1 tablet (50 mg total) by mouth 2 (two) times daily. Resume when ok with Cardiology and BP is improved 60  tablet 2  ? Multiple Vitamin (MULITIVITAMIN WITH MINERALS) TABS Take 1 tablet by mouth 4 (four) times a week.    ? ondansetron (ZOFRAN) 4 MG tablet Take 1 tablet (4 mg total) by mouth every 6 (six) hours as needed for nausea. 20 tablet 0  ? pantoprazole (PROTONIX) 40 MG tablet TAKE 1 TABLET BY MOUTH  DAILY 90 tablet 3  ? PARoxetine (PAXIL) 20 MG tablet TAKE 1 TABLET BY MOUTH  TWICE DAILY (Patient taking differently: Take 20 mg by mouth in the morning and at bedtime.) 180 tablet 3  ? polyethylene glycol (MIRALAX / GLYCOLAX) 17 g packet Take 17 g by mouth daily. 14 each 0  ?  potassium chloride SA (KLOR-CON M) 20 MEQ tablet Take 1 tablet (20 mEq total) by mouth daily. 30 tablet 2  ? senna-docusate (SENOKOT-S) 8.6-50 MG tablet Take 1 tablet by mouth at bedtime. 30 tablet 0  ? ?No current facility-administered medications for this encounter.  ? ? ?Allergies  ?Allergen Reactions  ? Morphine And Related   ?  Severe HA per pt  ? Nitrofurantoin Nausea And Vomiting  ? ? ?Social History  ? ?Socioeconomic History  ? Marital status: Divorced  ?  Spouse name: Not on file  ? Number of children: Not on file  ? Years of education: Not on file  ? Highest education level: Not on file  ?Occupational History  ? Not on file  ?Tobacco Use  ? Smoking status: Every Day  ?  Packs/day: 0.30  ?  Types: Cigarettes  ?  Last attempt to quit: 08/28/2011  ?  Years since quitting: 10.4  ? Smokeless tobacco: Never  ?Substance and Sexual Activity  ? Alcohol use: No  ? Drug use: No  ? Sexual activity: Never  ?Other Topics Concern  ? Not on file  ?Social History Narrative  ? Not on file  ? ?Social Determinants of Health  ? ?Financial Resource Strain: Not on file  ?Food Insecurity: No Food Insecurity  ? Worried About Charity fundraiser in the Last Year: Never true  ? Ran Out of Food in the Last Year: Never true  ?Transportation Needs: No Transportation Needs  ? Lack of Transportation (Medical): No  ? Lack of Transportation (Non-Medical): No  ?Physical  Activity: Not on file  ?Stress: Not on file  ?Social Connections: Not on file  ?Intimate Partner Violence: Not on file  ? ? ?Family History  ?Problem Relation Age of Onset  ? Hypertension Maternal Grandm

## 2022-02-10 LAB — COMPLETE METABOLIC PANEL WITH GFR
AG Ratio: 1.5 (calc) (ref 1.0–2.5)
ALT: 16 U/L (ref 6–29)
AST: 16 U/L (ref 10–35)
Albumin: 4 g/dL (ref 3.6–5.1)
Alkaline phosphatase (APISO): 73 U/L (ref 37–153)
BUN/Creatinine Ratio: 16 (calc) (ref 6–22)
BUN: 23 mg/dL (ref 7–25)
CO2: 26 mmol/L (ref 20–32)
Calcium: 10.1 mg/dL (ref 8.6–10.4)
Chloride: 100 mmol/L (ref 98–110)
Creat: 1.4 mg/dL — ABNORMAL HIGH (ref 0.60–1.00)
Globulin: 2.6 g/dL (calc) (ref 1.9–3.7)
Glucose, Bld: 109 mg/dL — ABNORMAL HIGH (ref 65–99)
Potassium: 4.5 mmol/L (ref 3.5–5.3)
Sodium: 139 mmol/L (ref 135–146)
Total Bilirubin: 0.5 mg/dL (ref 0.2–1.2)
Total Protein: 6.6 g/dL (ref 6.1–8.1)
eGFR: 39 mL/min/{1.73_m2} — ABNORMAL LOW (ref >=60)

## 2022-02-10 LAB — CBC WITH DIFFERENTIAL/PLATELET
Absolute Monocytes: 877 cells/uL (ref 200–950)
Basophils Absolute: 82 cells/uL (ref 0–200)
Basophils Relative: 0.8 %
Eosinophils Absolute: 255 cells/uL (ref 15–500)
Eosinophils Relative: 2.5 %
HCT: 45.8 % — ABNORMAL HIGH (ref 35.0–45.0)
Hemoglobin: 15.1 g/dL (ref 11.7–15.5)
Lymphs Abs: 1826 cells/uL (ref 850–3900)
MCH: 27.5 pg (ref 27.0–33.0)
MCHC: 33 g/dL (ref 32.0–36.0)
MCV: 83.3 fL (ref 80.0–100.0)
MPV: 12 fL (ref 7.5–12.5)
Monocytes Relative: 8.6 %
Neutro Abs: 7160 cells/uL (ref 1500–7800)
Neutrophils Relative %: 70.2 %
Platelets: 231 10*3/uL (ref 140–400)
RBC: 5.5 10*6/uL — ABNORMAL HIGH (ref 3.80–5.10)
RDW: 13.4 % (ref 11.0–15.0)
Total Lymphocyte: 17.9 %
WBC: 10.2 10*3/uL (ref 3.8–10.8)

## 2022-02-10 LAB — MAGNESIUM: Magnesium: 2.1 mg/dL (ref 1.5–2.5)

## 2022-02-10 LAB — TSH: TSH: 4.14 mIU/L (ref 0.40–4.50)

## 2022-02-24 DIAGNOSIS — I110 Hypertensive heart disease with heart failure: Secondary | ICD-10-CM

## 2022-02-24 DIAGNOSIS — I4819 Other persistent atrial fibrillation: Secondary | ICD-10-CM

## 2022-02-27 ENCOUNTER — Ambulatory Visit (INDEPENDENT_AMBULATORY_CARE_PROVIDER_SITE_OTHER): Payer: Medicare Other | Admitting: *Deleted

## 2022-02-27 DIAGNOSIS — I4891 Unspecified atrial fibrillation: Secondary | ICD-10-CM

## 2022-02-27 DIAGNOSIS — I10 Essential (primary) hypertension: Secondary | ICD-10-CM

## 2022-02-27 DIAGNOSIS — I5021 Acute systolic (congestive) heart failure: Secondary | ICD-10-CM

## 2022-02-27 NOTE — Chronic Care Management (AMB) (Signed)
?Chronic Care Management  ? ?CCM RN Visit Note ? ?02/27/2022 ?Name: Kristina Dougherty MRN: [id number] DOB: June 27, 1946 ? ?Subjective: ?Kristina Dougherty is a 76 y.o. year old female who is a primary care patient of Pickard, Cammie Mcgee, MD. The care management team was consulted for assistance with disease management and care coordination needs.   ? ?Engaged with patient by telephone for follow up visit in response to provider referral for case management and/or care coordination services.  ? ?Consent to Services:  ?The patient was given information about Chronic Care Management services, agreed to services, and gave verbal consent prior to initiation of services.  Please see initial visit note for detailed documentation.  ? ?Patient agreed to services and verbal consent obtained.  ? ?Assessment: Review of patient past medical history, allergies, medications, health status, including review of consultants reports, laboratory and other test data, was performed as part of comprehensive evaluation and provision of chronic care management services.  ? ?SDOH (Social Determinants of Health) assessments and interventions performed:   ? ?CCM Care Plan ? ?Allergies  ?Allergen Reactions  ? Morphine And Related   ?  Severe HA per pt  ? Nitrofurantoin Nausea And Vomiting  ? ? ?Outpatient Encounter Medications as of 02/27/2022  ?Medication Sig Note  ? acetaminophen (TYLENOL) 500 MG tablet Take 500 mg by mouth every 6 (six) hours as needed (pain.).   ? ALPRAZolam (XANAX) 0.5 MG tablet TAKE 1 TABLET BY MOUTH THREE TIMES DAILY AS NEEDED FOR ANXIETY OR  SLEEP   ? amiodarone (PACERONE) 200 MG tablet Take 1 tablet (200 mg total) by mouth 2 (two) times daily.   ? apixaban (ELIQUIS) 5 MG TABS tablet Take 1 tablet (5 mg total) by mouth 2 (two) times daily.   ? clobetasol cream (TEMOVATE) 7.32 % APPLY ONE APPLICATION TOPICALLY TWO TIMES DAILY (Patient taking differently: Apply 1 application. topically See admin instructions. APPLY ONE APPLICATION  TOPICALLY TWO TIMES DAILY prn)   ? furosemide (LASIX) 40 MG tablet Take 1 tablet (40 mg total) by mouth daily.   ? losartan (COZAAR) 50 MG tablet Take 1 tablet (50 mg total) by mouth daily. Resume when OK with Cards and BP is better   ? metoprolol tartrate (LOPRESSOR) 50 MG tablet Take 1 tablet (50 mg total) by mouth 2 (two) times daily. Resume when ok with Cardiology and BP is improved   ? Multiple Vitamin (MULITIVITAMIN WITH MINERALS) TABS Take 1 tablet by mouth 4 (four) times a week.   ? ondansetron (ZOFRAN) 4 MG tablet Take 1 tablet (4 mg total) by mouth every 6 (six) hours as needed for nausea.   ? PARoxetine (PAXIL) 20 MG tablet TAKE 1 TABLET BY MOUTH  TWICE DAILY (Patient taking differently: Take 20 mg by mouth in the morning and at bedtime.)   ? polyethylene glycol (MIRALAX / GLYCOLAX) 17 g packet Take 17 g by mouth daily.   ? potassium chloride SA (KLOR-CON M) 20 MEQ tablet Take 1 tablet (20 mEq total) by mouth daily.   ? senna-docusate (SENOKOT-S) 8.6-50 MG tablet Take 1 tablet by mouth at bedtime.   ? pantoprazole (PROTONIX) 40 MG tablet TAKE 1 TABLET BY MOUTH  DAILY (Patient not taking: Reported on 02/27/2022) 01/04/2022: On hold due to afib  ? ?No facility-administered encounter medications on file as of 02/27/2022.  ? ? ?Patient Active Problem List  ? Diagnosis Date Noted  ? Thrombocytopenia ([place]) 01/21/2022  ? Pleural effusion due to CHF (congestive heart failure) ([place])  01/19/2022  ? Obesity (BMI 30-39.9) 01/18/2022  ? Pericardial effusion 01/17/2022  ? Acute systolic CHF (congestive heart failure) ([place]) 01/17/2022  ? AKI (acute kidney injury) ([place]) 01/17/2022  ? Hypomagnesemia 01/17/2022  ? Abnormal thyroid function test 01/17/2022  ? Constipation 01/17/2022  ? GERD (gastroesophageal reflux disease) 01/17/2022  ? Persistent atrial fibrillation ([place])   ? Bleeding per rectum 12/15/2021  ? Change in bowel habit 12/15/2021  ? Diarrhea 12/15/2021  ? First degree hemorrhoids 12/15/2021  ? Flatulence, eructation  and gas pain 12/15/2021  ? Irritable bowel syndrome with diarrhea 12/15/2021  ? Hypokalemia 12/05/2021  ? Atrial fibrillation with rapid ventricular response ([name]) 12/04/2021  ? Physical exam, annual 08/14/2012  ? C. difficile colitis 04/24/2012  ? Lichen sclerosus et atrophicus 04/23/2012  ? HYPERTENSION, MILD 11/25/2009  ? ARTHRITIS, KNEE 10/07/2009  ? ACUTE BRONCHITIS 09/21/2009  ? ARTHRITIS, KNEES, BILATERAL 09/21/2009  ? UTI 02/18/2009  ? EDEMA 02/18/2009  ? BURSITIS, RIGHT SHOULDER 11/04/2008  ? TOBACCO USE, QUIT 11/04/2008  ? Essential hypertension 11/14/2007  ? ANXIETY DEPRESSION 11/06/2007  ? SCIATICA 11/06/2007  ? ? ?Conditions to be addressed/monitored:Atrial Fibrillation, CHF, and HTN ? ?Care Plan : RN Care Manager Plan of Care  ?Updates made by Kassie Mends, RN since 02/27/2022 12:00 AM  ?  ? ?Problem: No plan of care established for management of chronic disease state  (Atrial Fibrillation, CHF, HTN)   ?Priority: High  ?  ? ?Long-Range Goal: Development of plan of care for chronic disease management  (Atrial Fibrillation, CHF, HTN)   ?Start Date: 01/27/2022  ?Expected End Date: 07/26/2022  ?Priority: High  ?Note:   ?Current Barriers:  ?Knowledge Deficits related to plan of care for management of Atrial Fibrillation, CHF, and HTN  ?No Advanced Directives in place- pt declines information ?Patient reports she lives alone, has friend that lives nearby that transports her to appointments and to pickup medications and groceries, pt reports she has a car and is now driving again, feels she is getting stronger. Pt reports she has blood pressure cuff but does not check and will ask her friend to assist to check blood pressure on occasion, has scale and does weigh daily with today's weight 160 pounds. Pt hospitalized 01/17/22 diagnosis atrial fibrillation, CHF exacerbation with pleural effusion left side. Pt reports she has no needs for social worker or pharmacist at this time.  Pt reports she has all medications  and taking as prescribed, states in the future she may switch to a pharmacy that delivers but states " I'm not gonna do this right now"  ? ?RNCM Clinical Goal(s):  ?Patient will verbalize understanding of plan for management of Atrial Fibrillation, CHF, and HTN as evidenced by patient report, review of EHR and  through collaboration with RN Care manager, provider, and care team.  ? ?Interventions: ?1:1 collaboration with primary care provider regarding development and update of comprehensive plan of care as evidenced by provider attestation and co-signature ?Inter-disciplinary care team collaboration (see longitudinal plan of care) ?Evaluation of current treatment plan related to  self management and patient's adherence to plan as established by provider ? ? ?AFIB Interventions: (Status:  New goal. and Goal on track:  Yes.) Long Term Goal ?  Counseled on increased risk of stroke due to Afib and benefits of anticoagulation for stroke prevention ?Reviewed importance of adherence to anticoagulant exactly as prescribed ?Afib action plan reviewed ?Reviewed importance of doing some type of exercise, walking is good ? ? ?Heart Failure Interventions:  (  Status:  New goal. and Goal on track:  Yes.) Long Term Goal ?Provided education on low sodium diet ?Discussed importance of daily weight and advised patient to weigh and record daily ?Provided patient with education about the role of exercise in the management of heart failure ?Reinforced Heart Failure action plan ? ?Hypertension Interventions:  (Status:  New goal. and Goal on track:  Yes.) Long Term Goal ?Last practice recorded BP readings:  ?BP Readings from Last 3 Encounters:  ?01/22/22 121/69  ?01/17/22 (!) 124/98  ?01/12/22 94/68  ?Most recent eGFR/CrCl:  ?Lab Results  ?Component Value Date  ? EGFR 72 12/16/2021  ?  No components found for: CRCL ? ?Evaluation of current treatment plan related to hypertension self management and patient's adherence to plan as established  by provider ?Reviewed medications with patient and discussed importance of compliance ?Discussed complications of poorly controlled blood pressure such as heart disease, stroke, circulatory complicati

## 2022-02-27 NOTE — Patient Instructions (Signed)
Visit Information ? ?Thank you for taking time to visit with me today. Please don't hesitate to contact me if I can be of assistance to you before our next scheduled telephone appointment. ? ?Following are the goals we discussed today:  ?Take medications as prescribed   ?Attend all scheduled provider appointments ?Call pharmacy for medication refills 3-7 days in advance of running out of medications ?Perform all self care activities independently  ?Perform IADL's (shopping, preparing meals, housekeeping, managing finances) independently ?Call provider office for new concerns or questions  ?call office if I gain more than 2 pounds in one day or 5 pounds in one week ?keep legs up while sitting ?track weight in diary ?use salt in moderation ?watch for swelling in feet, ankles and legs every day ?eat more whole grains, fruits and vegetables, lean meats and healthy fats ?track symptoms and what helps feel better or worse ?check pulse (heart) rate once a day ?make a plan to exercise regularly ?make a plan to eat healthy ?keep all lab appointments ?take medicine as prescribed ?check blood pressure 3 times per week ?choose a place to take my blood pressure (home, clinic or office, retail store) ?write blood pressure results in a log or diary ?take blood pressure log to all doctor appointments ?take medications for blood pressure exactly as prescribed ?Follow Heart Failure and Atrial Fibrillation action plan, call your doctor early on for change in health status, symptoms ?Call RN care manager for any questions at [phone] ?Follow up with primary care doctor on 3/8 and cardiologist on 03/02/22 ? ?Our next appointment is by telephone on 05/01/22 at 130 pm ? ?Please call the care guide team at [phone] if you need to cancel or reschedule your appointment.  ? ?If you are experiencing a Mental Health or [place] or need someone to talk to, please call the Suicide and Crisis Lifeline: 988 ?call the [place]  National Suicide Prevention Lifeline: (727)485-0714 or TTY: 5677138127 TTY 6606127576) to talk to a trained counselor ?call 1-800-273-TALK (toll free, 24 hour hotline) ?go to [facility] [address] [place] [phone]) ?call 911  ? ?The patient verbalized understanding of instructions, educational materials, and care plan provided today and declined offer to receive copy of patient instructions, educational materials, and care plan.  ? ?Jacqlyn Larsen RNC, BSN ?RN Case Manager ?[place] ?[phone] ? ?

## 2022-03-02 ENCOUNTER — Ambulatory Visit (HOSPITAL_COMMUNITY)
Admission: RE | Admit: 2022-03-02 | Discharge: 2022-03-02 | Disposition: A | Discharge status: Home / Self Care | Payer: Medicare Other | Source: Ambulatory Visit | Attending: Nurse Practitioner | Admitting: Nurse Practitioner

## 2022-03-02 VITALS — BP 106/78 | HR 107 | Ht 62.0 in | Wt 166.8 lb

## 2022-03-02 DIAGNOSIS — R0602 Shortness of breath: Secondary | ICD-10-CM | POA: Insufficient documentation

## 2022-03-02 DIAGNOSIS — Z7901 Long term (current) use of anticoagulants: Secondary | ICD-10-CM | POA: Insufficient documentation

## 2022-03-02 DIAGNOSIS — I4819 Other persistent atrial fibrillation: Secondary | ICD-10-CM

## 2022-03-02 DIAGNOSIS — I4891 Unspecified atrial fibrillation: Secondary | ICD-10-CM | POA: Diagnosis not present

## 2022-03-02 DIAGNOSIS — D6869 Other thrombophilia: Secondary | ICD-10-CM

## 2022-03-02 LAB — BASIC METABOLIC PANEL
Anion gap: 9 (ref 5–15)
BUN: 21 mg/dL (ref 8–23)
CO2: 27 mmol/L (ref 22–32)
Calcium: 9.6 mg/dL (ref 8.9–10.3)
Chloride: 104 mmol/L (ref 98–111)
Creatinine, Ser: 1.23 mg/dL — ABNORMAL HIGH (ref 0.44–1.00)
GFR, Estimated: 45 mL/min — ABNORMAL LOW (ref >60)
Glucose, Bld: 134 mg/dL — ABNORMAL HIGH (ref 70–99)
Potassium: 4 mmol/L (ref 3.5–5.1)
Sodium: 140 mmol/L (ref 135–145)

## 2022-03-02 LAB — CBC
HCT: 46.4 % — ABNORMAL HIGH (ref 36.0–46.0)
Hemoglobin: 15.5 g/dL — ABNORMAL HIGH (ref 12.0–15.0)
MCH: 27.2 pg (ref 26.0–34.0)
MCHC: 33.4 g/dL (ref 30.0–36.0)
MCV: 81.5 fL (ref 80.0–100.0)
Platelets: 195 10*3/uL (ref 150–400)
RBC: 5.69 MIL/uL — ABNORMAL HIGH (ref 3.87–5.11)
RDW: 14.2 % (ref 11.5–15.5)
WBC: 8.4 10*3/uL (ref 4.0–10.5)
nRBC: 0 % (ref 0.0–0.2)

## 2022-03-02 MED ORDER — FUROSEMIDE 40 MG PO TABS: 40.0000 mg | ORAL_TABLET | Freq: Every day | ORAL | 6 refills | Status: DC

## 2022-03-02 MED ORDER — AMIODARONE HCL 200 MG PO TABS: 200.0000 mg | ORAL_TABLET | Freq: Every day | ORAL | 6 refills | Status: DC

## 2022-03-02 NOTE — Progress Notes (Signed)
? ?Primary Care Physician: Susy Frizzle, MD ?Referring Physician: Hospital F/u  ? ? ?Kristina Dougherty is a 76 y.o. female with a h/o HTN, anxiety/depression, that was admitted 12/04/21 to 12/06/21 at [facility] with new onset afib with RVR. She was rate controlled with IV Cardizem and then switched to metoprolol 50 mg bid.  She was also started on eliquis 5 mg bid for a CHA2DS2VASc  score of 4.  ? ?In the clinic on f/u, her EKG showed afib with RVR at 162 bpm. She had gained 10  lbs of fluid. She is having shortness of breath  and she stopped both eliquis and metoprolol as she felt the drugs were making her worse. She feels very full thru her abdomen which effects her appetite and has ++ pedal edema, does not describe PND/orthopnea. Her HCTZ was stopped in the hospital. She is here with her son today. She had a fall last week but was not on anticoagulation at the time.   ? ?F/u in the afib clinic, 01/03/22. She now has had on her scales at home, in her PJ's, around a 10 lb weight loss, since lasix was started last visit  here with clothes, 6 lbs. Since back on metoprolol, she is now running 117-130 bpm in afib  vrs 160bpm last week when I saw her not on metoprolol. She has been taking her eliquis on  a regular basis since last week. She feels improved but still with a lot of fatigue and shortness of breath. Still no PND/orthopnea. We discussed that she has a soft BP limiting up titration o BB. Since she has not been on anticoagulation x 3 weeks, and I am concerned she may have deterioration in her status,   will go ahead and schedule TEE/cardioversion for next Monday, which is next available. She and her son are in agreement. Still remind them to have a low threshold to go to ER if symptoms worsen.  ? ?F/u in afib clinic 03/02/22. She is feeling so much better as she is taking her lasix correctly and her weight is down around 6 lbs. She remains in rate controlled afib and now that she has loaded on amiodarone for another  month will give it one more try at cardioversion to see if can convert her to SR. She is being compliant with anticoagulation. ? ?Today, she denies symptoms of palpitations, chest pain, shortness of breath, orthopnea, PND, lower extremity edema, dizziness, presyncope, syncope, or neurologic sequela. The patient is tolerating medications without difficulties and is otherwise without complaint today.  ? ?Past Medical History:  ?Diagnosis Date  ? Anxiety   ? Arthritis   ? Atrial fibrillation ([place])   ? Deaf, right   ? Depression   ? Hypertension   ? Osteopenia   ? ?Past Surgical History:  ?Procedure Laterality Date  ? ABDOMINAL HYSTERECTOMY    ? APPENDECTOMY    ? BUBBLE STUDY  01/09/2022  ? Procedure: BUBBLE STUDY;  Surgeon: [name], MD;  Location: [place];  Service: Cardiovascular;;  ? CARDIOVERSION N/A 01/09/2022  ? Procedure: CARDIOVERSION;  Surgeon: [name], MD;  Location: [place];  Service: Cardiovascular;  Laterality: N/A;  ? CARDIOVERSION N/A 01/20/2022  ? Procedure: CARDIOVERSION;  Surgeon: Jerline Pain, MD;  Location: [facility] ENDOSCOPY;  Service: Cardiovascular;  Laterality: N/A;  ? TEE WITHOUT CARDIOVERSION N/A 01/09/2022  ? Procedure: TRANSESOPHAGEAL ECHOCARDIOGRAM (TEE);  Surgeon: [name], MD;  Location: [place];  Service: Cardiovascular;  Laterality: N/A;  ? TEE  WITHOUT CARDIOVERSION N/A 01/20/2022  ? Procedure: TRANSESOPHAGEAL ECHOCARDIOGRAM (TEE);  Surgeon: Jerline Pain, MD;  Location: [facility] ENDOSCOPY;  Service: Cardiovascular;  Laterality: N/A;  ? ? ?Current Outpatient Medications  ?Medication Sig Dispense Refill  ? acetaminophen (TYLENOL) 500 MG tablet Take 500 mg by mouth every 6 (six) hours as needed (pain.).    ? ALPRAZolam (XANAX) 0.5 MG tablet TAKE 1 TABLET BY MOUTH THREE TIMES DAILY AS NEEDED FOR ANXIETY OR  SLEEP 30 tablet 0  ? amiodarone (PACERONE) 200 MG tablet Take 1 tablet (200 mg total) by mouth 2 (two) times daily.    ? apixaban (ELIQUIS) 5 MG TABS tablet  Take 1 tablet (5 mg total) by mouth 2 (two) times daily. 60 tablet 2  ? clobetasol cream (TEMOVATE) 9.35 % APPLY ONE APPLICATION TOPICALLY TWO TIMES DAILY (Patient taking differently: Apply 1 application. topically See admin instructions. APPLY ONE APPLICATION TOPICALLY TWO TIMES DAILY prn) 30 g 3  ? furosemide (LASIX) 40 MG tablet Take 1 tablet (40 mg total) by mouth daily. 30 tablet 2  ? losartan (COZAAR) 50 MG tablet Take 1 tablet (50 mg total) by mouth daily. Resume when OK with Cards and BP is better    ? metoprolol tartrate (LOPRESSOR) 50 MG tablet Take 1 tablet (50 mg total) by mouth 2 (two) times daily. Resume when ok with Cardiology and BP is improved 60 tablet 2  ? Multiple Vitamin (MULITIVITAMIN WITH MINERALS) TABS Take 1 tablet by mouth 4 (four) times a week.    ? ondansetron (ZOFRAN) 4 MG tablet Take 1 tablet (4 mg total) by mouth every 6 (six) hours as needed for nausea. 20 tablet 0  ? pantoprazole (PROTONIX) 40 MG tablet TAKE 1 TABLET BY MOUTH  DAILY 90 tablet 3  ? PARoxetine (PAXIL) 20 MG tablet TAKE 1 TABLET BY MOUTH  TWICE DAILY (Patient taking differently: Take 20 mg by mouth in the morning and at bedtime.) 180 tablet 3  ? polyethylene glycol (MIRALAX / GLYCOLAX) 17 g packet Take 17 g by mouth daily. 14 each 0  ? potassium chloride SA (KLOR-CON M) 20 MEQ tablet Take 1 tablet (20 mEq total) by mouth daily. 30 tablet 2  ? senna-docusate (SENOKOT-S) 8.6-50 MG tablet Take 1 tablet by mouth at bedtime. (Patient taking differently: Take 1 tablet by mouth as needed.) 30 tablet 0  ? ?No current facility-administered medications for this encounter.  ? ? ?Allergies  ?Allergen Reactions  ? Morphine And Related   ?  Severe HA per pt  ? Nitrofurantoin Nausea And Vomiting  ? ? ?Social History  ? ?Socioeconomic History  ? Marital status: Divorced  ?  Spouse name: Not on file  ? Number of children: Not on file  ? Years of education: Not on file  ? Highest education level: Not on file  ?Occupational History  ? Not  on file  ?Tobacco Use  ? Smoking status: Every Day  ?  Packs/day: 0.30  ?  Types: Cigarettes  ?  Last attempt to quit: 08/28/2011  ?  Years since quitting: 10.5  ? Smokeless tobacco: Never  ?Substance and Sexual Activity  ? Alcohol use: No  ? Drug use: No  ? Sexual activity: Never  ?Other Topics Concern  ? Not on file  ?Social History Narrative  ? Not on file  ? ?Social Determinants of Health  ? ?Financial Resource Strain: Not on file  ?Food Insecurity: No Food Insecurity  ? Worried About Charity fundraiser in  the Last Year: Never true  ? Ran Out of Food in the Last Year: Never true  ?Transportation Needs: No Transportation Needs  ? Lack of Transportation (Medical): No  ? Lack of Transportation (Non-Medical): No  ?Physical Activity: Not on file  ?Stress: Not on file  ?Social Connections: Not on file  ?Intimate Partner Violence: Not on file  ? ? ?Family History  ?Problem Relation Age of Onset  ? Hypertension Maternal Grandmother   ? ? ?ROS- All systems are reviewed and negative except as per the HPI above ? ?Physical Exam: ?Vitals:  ? 03/02/22 1407  ?BP: 106/78  ?Pulse: (!) 107  ?Weight: 75.7 kg  ?Height: '5\' 2"'$  (1.575 m)  ? ?Wt Readings from Last 3 Encounters:  ?03/02/22 75.7 kg  ?02/09/22 78.9 kg  ?02/09/22 78.5 kg  ? ? ?Labs: ?Lab Results  ?Component Value Date  ? NA 139 02/09/2022  ? K 4.5 02/09/2022  ? CL 100 02/09/2022  ? CO2 26 02/09/2022  ? GLUCOSE 109 (H) 02/09/2022  ? BUN 23 02/09/2022  ? CREATININE 1.40 (H) 02/09/2022  ? CALCIUM 10.1 02/09/2022  ? PHOS 3.7 01/22/2022  ? MG 2.1 02/09/2022  ? ?No results found for: INR ?Lab Results  ?Component Value Date  ? CHOL 237 (H) 11/22/2016  ? HDL 71 11/22/2016  ? LDLCALC 148 (H) 11/22/2016  ? TRIG 90 11/22/2016  ? ? ? ?GEN- The patient is well appearing, alert and oriented x 3 today.   ?Head- normocephalic, atraumatic ?Eyes-  Sclera clear, conjunctiva pink ?Ears- hearing intact ?Oropharynx- clear ?Neck- supple, no JVP ?Lymph- no cervical lymphadenopathy ?Lungs- Clear  to ausculation bilaterally, normal work of breathing ?Heart- rapid irregular rate and rhythm, no murmurs, rubs or gallops, PMI not laterally displaced ?GI- distended,  + BS ?Extremities- no clubbing,

## 2022-03-02 NOTE — Patient Instructions (Addendum)
Cardioversion scheduled for Monday, April 24th ? - Arrive at the [facility] and go to admitting at 930am ? - Do not eat or drink anything after midnight the night prior to your procedure. ? - Take all your morning medication (except diabetic medications) with a sip of water prior to arrival. ? - You will not be able to drive home after your procedure. ? - Do NOT miss any doses of your blood thinner - if you should miss a dose please notify our office immediately. ? - If you feel as if you go back into normal rhythm prior to scheduled cardioversion, please notify our office immediately. If your procedure is canceled in the cardioversion suite you will be charged a cancellation fee. ? ?

## 2022-03-14 ENCOUNTER — Other Ambulatory Visit (HOSPITAL_COMMUNITY): Payer: Self-pay | Admitting: *Deleted

## 2022-03-14 MED ORDER — POTASSIUM CHLORIDE CRYS ER 20 MEQ PO TBCR: 20.0000 meq | EXTENDED_RELEASE_TABLET | Freq: Every day | ORAL | 2 refills | Status: DC

## 2022-03-14 MED ORDER — AMIODARONE HCL 200 MG PO TABS: 200.0000 mg | ORAL_TABLET | Freq: Every day | ORAL | 1 refills | Status: DC

## 2022-03-14 MED ORDER — METOPROLOL TARTRATE 50 MG PO TABS: 50.0000 mg | ORAL_TABLET | Freq: Two times a day (BID) | ORAL | 1 refills | Status: DC

## 2022-03-14 MED ORDER — APIXABAN 5 MG PO TABS: 5.0000 mg | ORAL_TABLET | Freq: Two times a day (BID) | ORAL | 2 refills | Status: DC

## 2022-03-17 ENCOUNTER — Other Ambulatory Visit: Payer: Self-pay | Admitting: Family Medicine

## 2022-03-20 ENCOUNTER — Other Ambulatory Visit: Payer: Self-pay

## 2022-03-20 ENCOUNTER — Ambulatory Visit (HOSPITAL_COMMUNITY): Admit: 2022-03-20 | Payer: Medicare Other | Admitting: Cardiology

## 2022-03-20 ENCOUNTER — Encounter (HOSPITAL_COMMUNITY): Payer: Self-pay

## 2022-03-20 SURGERY — CARDIOVERSION
Anesthesia: General

## 2022-03-20 NOTE — Telephone Encounter (Signed)
meloxicam (MOBIC) 15 MG tablet   ? The original prescription was discontinued on 12/06/2021 by [place] Eric J, DO for the following reason: Stop Taking at Discharge. Renewing this prescription may not be appropriate.  ? ? ? ?Please advise on refill request, thanks! ?

## 2022-03-23 ENCOUNTER — Ambulatory Visit (INDEPENDENT_AMBULATORY_CARE_PROVIDER_SITE_OTHER): Payer: Medicare Other

## 2022-03-23 VITALS — Ht 62.0 in | Wt 160.0 lb

## 2022-03-23 DIAGNOSIS — Z0000 Encounter for general adult medical examination without abnormal findings: Secondary | ICD-10-CM | POA: Diagnosis not present

## 2022-03-23 NOTE — Patient Instructions (Addendum)
Kristina Dougherty , ?Thank you for taking time to come for your Medicare Wellness Visit. I appreciate your ongoing commitment to your health goals. Please review the following plan we discussed and let me know if I can assist you in the future.  ? ?Screening recommendations/referrals: ?Colonoscopy: DONE 11/02/2019 REPEAT EVERY 3 YEARS. DONE  AT [place] ? ?Mammogram: Done 04/11/2016 Repeat annually ? ?Bone Density: Done 04/11/2016 Repeat annually ? ?Recommended yearly ophthalmology/optometry visit for glaucoma screening and checkup ?Recommended yearly dental visit for hygiene and checkup ? ?Vaccinations: ?Influenza vaccine: DONE 08/31/2021 Repeat annually ? ?Pneumococcal vaccine: DONE 08/14/2012 AND 12/04/2013 ?Shingles vaccine: DISCUSSED. ZOSTER DONE 03/24/2016   ?Covid-19:DISCUSSED. ? ?Advanced directives: Advance directive discussed with you today. Even though you declined this today, please call our office should you change your mind, and we can give you the proper paperwork for you to fill out. ? ? ?Conditions/risks identified: Aim for 30 minutes of exercise or brisk walking, 6-8 glasses of water, and 5 servings of fruits and vegetables each day. ?KEEP UP THE GOOD WORK!! ? ?Next appointment: Follow up in one year for your annual wellness visit 03/29/2023 @ 3:45 PM. ? ? ?Preventive Care 97 Years and Older, Female ?Preventive care refers to lifestyle choices and visits with your health care provider that can promote health and wellness. ?What does preventive care include? ?A yearly physical exam. This is also called an annual well check. ?Dental exams once or twice a year. ?Routine eye exams. Ask your health care provider how often you should have your eyes checked. ?Personal lifestyle choices, including: ?Daily care of your teeth and gums. ?Regular physical activity. ?Eating a healthy diet. ?Avoiding tobacco and drug use. ?Limiting alcohol use. ?Practicing safe sex. ?Taking low-dose aspirin every day. ?Taking  vitamin and mineral supplements as recommended by your health care provider. ?What happens during an annual well check? ?The services and screenings done by your health care provider during your annual well check will depend on your age, overall health, lifestyle risk factors, and family history of disease. ?Counseling  ?Your health care provider may ask you questions about your: ?Alcohol use. ?Tobacco use. ?Drug use. ?Emotional well-being. ?Home and relationship well-being. ?Sexual activity. ?Eating habits. ?History of falls. ?Memory and ability to understand (cognition). ?Work and work Statistician. ?Reproductive health. ?Screening  ?You may have the following tests or measurements: ?Height, weight, and BMI. ?Blood pressure. ?Lipid and cholesterol levels. These may be checked every 5 years, or more frequently if you are over 59 years old. ?Skin check. ?Lung cancer screening. You may have this screening every year starting at age 98 if you have a 30-pack-year history of smoking and currently smoke or have quit within the past 15 years. ?Fecal occult blood test (FOBT) of the stool. You may have this test every year starting at age 36. ?Flexible sigmoidoscopy or colonoscopy. You may have a sigmoidoscopy every 5 years or a colonoscopy every 10 years starting at age 53. ?Hepatitis C blood test. ?Hepatitis B blood test. ?Sexually transmitted disease (STD) testing. ?Diabetes screening. This is done by checking your blood sugar (glucose) after you have not eaten for a while (fasting). You may have this done every 1-3 years. ?Bone density scan. This is done to screen for osteoporosis. You may have this done starting at age 56. ?Mammogram. This may be done every 1-2 years. Talk to your health care provider about how often you should have regular mammograms. ?Talk with your health care provider about your test  results, treatment options, and if necessary, the need for more tests. ?Vaccines  ?Your health care provider may  recommend certain vaccines, such as: ?Influenza vaccine. This is recommended every year. ?Tetanus, diphtheria, and acellular pertussis (Tdap, Td) vaccine. You may need a Td booster every 10 years. ?Zoster vaccine. You may need this after age 21. ?Pneumococcal 13-valent conjugate (PCV13) vaccine. One dose is recommended after age 75. ?Pneumococcal polysaccharide (PPSV23) vaccine. One dose is recommended after age 42. ?Talk to your health care provider about which screenings and vaccines you need and how often you need them. ?This information is not intended to replace advice given to you by your health care provider. Make sure you discuss any questions you have with your health care provider. ?Document Released: 12/10/2015 Document Revised: 08/02/2016 Document Reviewed: 09/14/2015 ?Elsevier Interactive Patient Education ? 2017 [place]. ? ?Fall Prevention in the Home ?Falls can cause injuries. They can happen to people of all ages. There are many things you can do to make your home safe and to help prevent falls. ?What can I do on the outside of my home? ?Regularly fix the edges of walkways and driveways and fix any cracks. ?Remove anything that might make you trip as you walk through a door, such as a raised step or threshold. ?Trim any bushes or trees on the path to your home. ?Use bright outdoor lighting. ?Clear any walking paths of anything that might make someone trip, such as rocks or tools. ?Regularly check to see if handrails are loose or broken. Make sure that both sides of any steps have handrails. ?Any raised decks and porches should have guardrails on the edges. ?Have any leaves, snow, or ice cleared regularly. ?Use sand or salt on walking paths during winter. ?Clean up any spills in your garage right away. This includes oil or grease spills. ?What can I do in the bathroom? ?Use night lights. ?Install grab bars by the toilet and in the tub and shower. Do not use towel bars as grab bars. ?Use non-skid  mats or decals in the tub or shower. ?If you need to sit down in the shower, use a plastic, non-slip stool. ?Keep the floor dry. Clean up any water that spills on the floor as soon as it happens. ?Remove soap buildup in the tub or shower regularly. ?Attach bath mats securely with double-sided non-slip rug tape. ?Do not have throw rugs and other things on the floor that can make you trip. ?What can I do in the bedroom? ?Use night lights. ?Make sure that you have a light by your bed that is easy to reach. ?Do not use any sheets or blankets that are too big for your bed. They should not hang down onto the floor. ?Have a firm chair that has side arms. You can use this for support while you get dressed. ?Do not have throw rugs and other things on the floor that can make you trip. ?What can I do in the kitchen? ?Clean up any spills right away. ?Avoid walking on wet floors. ?Keep items that you use a lot in easy-to-reach places. ?If you need to reach something above you, use a strong step stool that has a grab bar. ?Keep electrical cords out of the way. ?Do not use floor polish or wax that makes floors slippery. If you must use wax, use non-skid floor wax. ?Do not have throw rugs and other things on the floor that can make you trip. ?What can I do with my stairs? ?  Do not leave any items on the stairs. ?Make sure that there are handrails on both sides of the stairs and use them. Fix handrails that are broken or loose. Make sure that handrails are as long as the stairways. ?Check any carpeting to make sure that it is firmly attached to the stairs. Fix any carpet that is loose or worn. ?Avoid having throw rugs at the top or bottom of the stairs. If you do have throw rugs, attach them to the floor with carpet tape. ?Make sure that you have a light switch at the top of the stairs and the bottom of the stairs. If you do not have them, ask someone to add them for you. ?What else can I do to help prevent falls? ?Wear shoes  that: ?Do not have high heels. ?Have rubber bottoms. ?Are comfortable and fit you well. ?Are closed at the toe. Do not wear sandals. ?If you use a stepladder: ?Make sure that it is fully opened. Do not climb a c

## 2022-03-23 NOTE — Progress Notes (Signed)
? ?Subjective:  ? Kristina Dougherty is a 76 y.o. female who presents for Medicare Annual (Subsequent) preventive examination. ?Virtual Visit via Telephone Note ? ?I connected with  Kristina Dougherty on 03/23/22 at  3:45 PM EDT by telephone and verified that I am speaking with the correct person using two identifiers. ? ?Location: ?Patient: HOME ?Provider: [facility] ?Persons participating in the virtual visit: patient/Nurse Health Advisor ?  ?I discussed the limitations, risks, security and privacy concerns of performing an evaluation and management service by telephone and the availability of in person appointments. The patient expressed understanding and agreed to proceed. ? ?Interactive audio and video telecommunications were attempted between this nurse and patient, however failed, due to patient having technical difficulties OR patient did not have access to video capability.  We continued and completed visit with audio only. ? ?Some vital signs may be absent or patient reported.  ? ?[name], LPN ? ?Review of Systems    ? ?Cardiac Risk Factors include: advanced age (>98mn, >>59women);hypertension;smoking/ tobacco exposure;sedentary lifestyle;Other (see comment), Risk factor comments: CHF, Sciatica, Afib ? ?   ?Objective:  ?  ?Today's Vitals  ? 03/23/22 1551 03/23/22 1556  ?Weight: 160 lb (72.6 kg)   ?Height: '5\' 2"'$  (1.575 m)   ?PainSc:  0-No pain  ? ?Body mass index is 29.26 kg/m?. ? ? ?  03/23/2022  ?  4:05 PM 01/27/2022  ? 10:19 AM 01/20/2022  ? 10:20 AM 01/09/2022  ?  7:47 AM 12/04/2021  ?  8:01 PM  ?Advanced Directives  ?Does Patient Have a Medical Advance Directive? No No No No No  ?Would patient like information on creating a medical advance directive? No - Patient declined No - Patient declined No - Patient declined No - Patient declined No - Patient declined  ? ? ?Current Medications (verified) ?Outpatient Encounter Medications as of 03/23/2022  ?Medication Sig  ? acetaminophen (TYLENOL) 500 MG tablet  Take 500 mg by mouth every 6 (six) hours as needed (pain.).  ? ALPRAZolam (XANAX) 0.5 MG tablet TAKE 1 TABLET BY MOUTH THREE TIMES DAILY AS NEEDED FOR ANXIETY OR  SLEEP  ? amiodarone (PACERONE) 200 MG tablet Take 1 tablet (200 mg total) by mouth daily.  ? amLODipine (NORVASC) 10 MG tablet Take 10 mg by mouth daily.  ? apixaban (ELIQUIS) 5 MG TABS tablet Take 1 tablet (5 mg total) by mouth 2 (two) times daily.  ? clobetasol cream (TEMOVATE) 06.16% APPLY ONE APPLICATION TOPICALLY TWO TIMES DAILY (Patient taking differently: Apply 1 application. topically See admin instructions. APPLY ONE APPLICATION TOPICALLY TWO TIMES DAILY prn)  ? furosemide (LASIX) 40 MG tablet Take 1 tablet (40 mg total) by mouth daily.  ? hydrochlorothiazide (MICROZIDE) 12.5 MG capsule Take 12.5 mg by mouth daily.  ? losartan (COZAAR) 50 MG tablet Take 1 tablet (50 mg total) by mouth daily. Resume when OK with Cards and BP is better  ? metoprolol tartrate (LOPRESSOR) 50 MG tablet Take 1 tablet (50 mg total) by mouth 2 (two) times daily.  ? Multiple Vitamin (MULITIVITAMIN WITH MINERALS) TABS Take 1 tablet by mouth 4 (four) times a week.  ? ondansetron (ZOFRAN) 4 MG tablet Take 1 tablet (4 mg total) by mouth every 6 (six) hours as needed for nausea.  ? pantoprazole (PROTONIX) 40 MG tablet TAKE 1 TABLET BY MOUTH  DAILY  ? PARoxetine (PAXIL) 20 MG tablet TAKE 1 TABLET BY MOUTH  TWICE DAILY (Patient taking differently: Take 20 mg by  mouth in the morning and at bedtime.)  ? polyethylene glycol (MIRALAX / GLYCOLAX) 17 g packet Take 17 g by mouth daily.  ? potassium chloride SA (KLOR-CON M) 20 MEQ tablet Take 1 tablet (20 mEq total) by mouth daily.  ? senna-docusate (SENOKOT-S) 8.6-50 MG tablet Take 1 tablet by mouth at bedtime. (Patient taking differently: Take 1 tablet by mouth as needed.)  ? ?No facility-administered encounter medications on file as of 03/23/2022.  ? ? ?Allergies (verified) ?Morphine and related and Nitrofurantoin  ? ?History: ?Past  Medical History:  ?Diagnosis Date  ? Anxiety   ? Arthritis   ? Atrial fibrillation ([place])   ? Deaf, right   ? Depression   ? Hypertension   ? Osteopenia   ? ?Past Surgical History:  ?Procedure Laterality Date  ? ABDOMINAL HYSTERECTOMY    ? APPENDECTOMY    ? BUBBLE STUDY  01/09/2022  ? Procedure: BUBBLE STUDY;  Surgeon: [name], MD;  Location: [place];  Service: Cardiovascular;;  ? CARDIOVERSION N/A 01/09/2022  ? Procedure: CARDIOVERSION;  Surgeon: [name], MD;  Location: [place];  Service: Cardiovascular;  Laterality: N/A;  ? CARDIOVERSION N/A 01/20/2022  ? Procedure: CARDIOVERSION;  Surgeon: Jerline Pain, MD;  Location: [facility] ENDOSCOPY;  Service: Cardiovascular;  Laterality: N/A;  ? TEE WITHOUT CARDIOVERSION N/A 01/09/2022  ? Procedure: TRANSESOPHAGEAL ECHOCARDIOGRAM (TEE);  Surgeon: [name], MD;  Location: [place];  Service: Cardiovascular;  Laterality: N/A;  ? TEE WITHOUT CARDIOVERSION N/A 01/20/2022  ? Procedure: TRANSESOPHAGEAL ECHOCARDIOGRAM (TEE);  Surgeon: Jerline Pain, MD;  Location: [facility] ENDOSCOPY;  Service: Cardiovascular;  Laterality: N/A;  ? ?Family History  ?Problem Relation Age of Onset  ? Hypertension Maternal Grandmother   ? ?Social History  ? ?Socioeconomic History  ? Marital status: Divorced  ?  Spouse name: Not on file  ? Number of children: Not on file  ? Years of education: Not on file  ? Highest education level: Not on file  ?Occupational History  ? Not on file  ?Tobacco Use  ? Smoking status: Every Day  ?  Packs/day: 0.30  ?  Types: Cigarettes  ?  Last attempt to quit: 08/28/2011  ?  Years since quitting: 10.5  ? Smokeless tobacco: Never  ?Substance and Sexual Activity  ? Alcohol use: No  ? Drug use: No  ? Sexual activity: Never  ?Other Topics Concern  ? Not on file  ?Social History Narrative  ? Not on file  ? ?Social Determinants of Health  ? ?Financial Resource Strain: Low Risk   ? Difficulty of Paying Living Expenses: Not hard at all  ?Food Insecurity:  No Food Insecurity  ? Worried About Charity fundraiser in the Last Year: Never true  ? Ran Out of Food in the Last Year: Never true  ?Transportation Needs: No Transportation Needs  ? Lack of Transportation (Medical): No  ? Lack of Transportation (Non-Medical): No  ?Physical Activity: Sufficiently Active  ? Days of Exercise per Week: 4 days  ? Minutes of Exercise per Session: 40 min  ?Stress: No Stress Concern Present  ? Feeling of Stress : Not at all  ?Social Connections: Moderately Isolated  ? Frequency of Communication with Friends and Family: More than three times a week  ? Frequency of Social Gatherings with Friends and Family: More than three times a week  ? Attends Religious Services: 1 to 4 times per year  ? Active Member of Clubs or Organizations: No  ? Attends Club or  Organization Meetings: Never  ? Marital Status: Divorced  ? ? ?Tobacco Counseling ?Ready to quit: Not Answered ?Counseling given: Not Answered ? ? ?Clinical Intake: ? ?Pre-visit preparation completed: Yes ? ?Pain : No/denies pain ?Pain Score: 0-No pain ? ?  ? ?BMI - recorded: 29.26 ?Nutritional Status: BMI 25 -29 Overweight ?Nutritional Risks: None ?Diabetes: No ? ?How often do you need to have someone help you when you read instructions, pamphlets, or other written materials from your doctor or pharmacy?: 1 - Never ? ?Diabetic?no ?Interpreter Needed?: No ? ?Information entered by :: mj Yalonda Sample,lpn ? ? ?Activities of Daily Living ? ?  03/23/2022  ?  4:05 PM 12/05/2021  ? 12:54 AM  ?In your present state of health, do you have any difficulty performing the following activities:  ?Hearing? 1 1  ?Comment Deaf in R ear.   ?Vision? 0 0  ?Difficulty concentrating or making decisions? 1 1  ?Comment Memory at times.   ?Walking or climbing stairs? 0 0  ?Dressing or bathing? 0 1  ?Doing errands, shopping? 0 1  ?Preparing Food and eating ? N   ?Using the Toilet? N   ?In the past six months, have you accidently leaked urine? N   ?Do you have problems with  loss of bowel control? N   ?Managing your Medications? N   ?Managing your Finances? N   ?Housekeeping or managing your Housekeeping? N   ? ? ?Patient Care Team: ?Susy Frizzle, MD as PCP - [facility]

## 2022-03-26 DIAGNOSIS — I4891 Unspecified atrial fibrillation: Secondary | ICD-10-CM | POA: Diagnosis not present

## 2022-03-26 DIAGNOSIS — I5021 Acute systolic (congestive) heart failure: Secondary | ICD-10-CM

## 2022-03-26 DIAGNOSIS — I10 Essential (primary) hypertension: Secondary | ICD-10-CM | POA: Diagnosis not present

## 2022-03-28 ENCOUNTER — Encounter (HOSPITAL_COMMUNITY): Payer: Self-pay | Admitting: Nurse Practitioner

## 2022-03-28 ENCOUNTER — Ambulatory Visit (HOSPITAL_COMMUNITY)
Admission: RE | Admit: 2022-03-28 | Discharge: 2022-03-28 | Disposition: A | Discharge status: Home / Self Care | Payer: Medicare Other | Source: Ambulatory Visit | Attending: Nurse Practitioner | Admitting: Nurse Practitioner

## 2022-03-28 VITALS — BP 128/98 | HR 92 | Ht 62.0 in | Wt 167.8 lb

## 2022-03-28 DIAGNOSIS — Z7901 Long term (current) use of anticoagulants: Secondary | ICD-10-CM | POA: Diagnosis not present

## 2022-03-28 DIAGNOSIS — Z79899 Other long term (current) drug therapy: Secondary | ICD-10-CM | POA: Diagnosis not present

## 2022-03-28 DIAGNOSIS — I4819 Other persistent atrial fibrillation: Secondary | ICD-10-CM

## 2022-03-28 DIAGNOSIS — I10 Essential (primary) hypertension: Secondary | ICD-10-CM | POA: Insufficient documentation

## 2022-03-28 DIAGNOSIS — F17210 Nicotine dependence, cigarettes, uncomplicated: Secondary | ICD-10-CM | POA: Insufficient documentation

## 2022-03-28 DIAGNOSIS — D6869 Other thrombophilia: Secondary | ICD-10-CM

## 2022-03-28 LAB — BASIC METABOLIC PANEL
Anion gap: 9 (ref 5–15)
BUN: 15 mg/dL (ref 8–23)
CO2: 30 mmol/L (ref 22–32)
Calcium: 9.8 mg/dL (ref 8.9–10.3)
Chloride: 103 mmol/L (ref 98–111)
Creatinine, Ser: 1.21 mg/dL — ABNORMAL HIGH (ref 0.44–1.00)
GFR, Estimated: 46 mL/min — ABNORMAL LOW (ref >60)
Glucose, Bld: 104 mg/dL — ABNORMAL HIGH (ref 70–99)
Potassium: 4.4 mmol/L (ref 3.5–5.1)
Sodium: 142 mmol/L (ref 135–145)

## 2022-03-28 LAB — CBC
HCT: 46.6 % — ABNORMAL HIGH (ref 36.0–46.0)
Hemoglobin: 15.3 g/dL — ABNORMAL HIGH (ref 12.0–15.0)
MCH: 26.7 pg (ref 26.0–34.0)
MCHC: 32.8 g/dL (ref 30.0–36.0)
MCV: 81.2 fL (ref 80.0–100.0)
Platelets: 225 10*3/uL (ref 150–400)
RBC: 5.74 MIL/uL — ABNORMAL HIGH (ref 3.87–5.11)
RDW: 15.2 % (ref 11.5–15.5)
WBC: 7.2 10*3/uL (ref 4.0–10.5)
nRBC: 0 % (ref 0.0–0.2)

## 2022-03-28 NOTE — Patient Instructions (Signed)
Cardioversion scheduled for Wednesday, May 10th ? - Arrive at the [facility] and go to admitting at 1030am ? - Do not eat or drink anything after midnight the night prior to your procedure. ? - Take all your morning medication (except diabetic medications) with a sip of water prior to arrival. ? - You will not be able to drive home after your procedure. ? - Do NOT miss any doses of your blood thinner - if you should miss a dose please notify our office immediately. ? - If you feel as if you go back into normal rhythm prior to scheduled cardioversion, please notify our office immediately. If your procedure is canceled in the cardioversion suite you will be charged a cancellation fee. ? ?

## 2022-03-28 NOTE — H&P (View-Only) (Signed)
? ?Primary Care Physician: Susy Frizzle, MD ?Referring Physician: Hospital F/u  ? ? ?Kristina Dougherty is a 76 y.o. female with a h/o HTN, anxiety/depression, that was admitted 12/04/21 to 12/06/21 at [facility] with new onset afib with RVR. She was rate controlled with IV Cardizem and then switched to metoprolol 50 mg bid.  She was also started on eliquis 5 mg bid for a CHA2DS2VASc  score of 4.  ? ?In the clinic on f/u, her EKG showed afib with RVR at 162 bpm. She had gained 10  lbs of fluid. She is having shortness of breath  and she stopped both eliquis and metoprolol as she felt the drugs were making her worse. She feels very full thru her abdomen which effects her appetite and has ++ pedal edema, does not describe PND/orthopnea. Her HCTZ was stopped in the hospital. She is here with her son today. She had a fall last week but was not on anticoagulation at the time.   ? ?F/u in the afib clinic, 01/03/22. She now has had on her scales at home, in her PJ's, around a 10 lb weight loss, since lasix was started last visit  here with clothes, 6 lbs. Since back on metoprolol, she is now running 117-130 bpm in afib  vrs 160bpm last week when I saw her not on metoprolol. She has been taking her eliquis on  a regular basis since last week. She feels improved but still with a lot of fatigue and shortness of breath. Still no PND/orthopnea. We discussed that she has a soft BP limiting up titration o BB. Since she has not been on anticoagulation x 3 weeks, and I am concerned she may have deterioration in her status,   will go ahead and schedule TEE/cardioversion for next Monday, which is next available. She and her son are in agreement. Still remind them to have a low threshold to go to ER if symptoms worsen.  ? ?F/u in afib clinic 03/02/22. She is feeling so much better as she is taking her lasix correctly and her weight is down around 6 lbs. She remains in rate controlled afib and now that she has loaded on amiodarone for another  month will give it one more try at cardioversion to see if can convert her to SR. She is being compliant with anticoagulation. ? ?F/u 03/28/22. Pt decided that her body needed a rest and cancelled her cardioversion after I saw her last. I reviewed with her today the her echo showed 40 to 45 % EF and with prior admits for HF  and with her being loaded on amiodarone it may be for her overall cardiac health and to prevent future  issues with heart failure if she carried out another cardioversion. She has not missed any anticoagulation. She consented to try to purse another cardioversion.She continues on amiodarone 200 mg daily. Overall she is feeling ok but tired.   ? ?Today, she denies symptoms of palpitations, chest pain, shortness of breath, orthopnea, PND, lower extremity edema, dizziness, presyncope, syncope, or neurologic sequela. The patient is tolerating medications without difficulties and is otherwise without complaint today.  ? ?Past Medical History:  ?Diagnosis Date  ? Anxiety   ? Arthritis   ? Atrial fibrillation ([place])   ? Deaf, right   ? Depression   ? Hypertension   ? Osteopenia   ? ?Past Surgical History:  ?Procedure Laterality Date  ? ABDOMINAL HYSTERECTOMY    ? APPENDECTOMY    ? BUBBLE  STUDY  01/09/2022  ? Procedure: BUBBLE STUDY;  Surgeon: [name], MD;  Location: [place];  Service: Cardiovascular;;  ? CARDIOVERSION N/A 01/09/2022  ? Procedure: CARDIOVERSION;  Surgeon: [name], MD;  Location: [place];  Service: Cardiovascular;  Laterality: N/A;  ? CARDIOVERSION N/A 01/20/2022  ? Procedure: CARDIOVERSION;  Surgeon: Jerline Pain, MD;  Location: [facility] ENDOSCOPY;  Service: Cardiovascular;  Laterality: N/A;  ? TEE WITHOUT CARDIOVERSION N/A 01/09/2022  ? Procedure: TRANSESOPHAGEAL ECHOCARDIOGRAM (TEE);  Surgeon: [name], MD;  Location: [place];  Service: Cardiovascular;  Laterality: N/A;  ? TEE WITHOUT CARDIOVERSION N/A 01/20/2022  ? Procedure: TRANSESOPHAGEAL  ECHOCARDIOGRAM (TEE);  Surgeon: Jerline Pain, MD;  Location: [facility] ENDOSCOPY;  Service: Cardiovascular;  Laterality: N/A;  ? ? ?Current Outpatient Medications  ?Medication Sig Dispense Refill  ? acetaminophen (TYLENOL) 500 MG tablet Take 500 mg by mouth every 6 (six) hours as needed (pain.).    ? ALPRAZolam (XANAX) 0.5 MG tablet TAKE 1 TABLET BY MOUTH THREE TIMES DAILY AS NEEDED FOR ANXIETY OR  SLEEP 30 tablet 0  ? amiodarone (PACERONE) 200 MG tablet Take 1 tablet (200 mg total) by mouth daily. 90 tablet 1  ? apixaban (ELIQUIS) 5 MG TABS tablet Take 1 tablet (5 mg total) by mouth 2 (two) times daily. 180 tablet 2  ? clobetasol cream (TEMOVATE) 1.61 % APPLY ONE APPLICATION TOPICALLY TWO TIMES DAILY (Patient taking differently: Apply 1 application. topically See admin instructions. APPLY ONE APPLICATION TOPICALLY TWO TIMES DAILY prn) 30 g 3  ? furosemide (LASIX) 40 MG tablet Take 1 tablet (40 mg total) by mouth daily. 30 tablet 6  ? losartan (COZAAR) 50 MG tablet Take 1 tablet (50 mg total) by mouth daily. Resume when OK with Cards and BP is better    ? metoprolol tartrate (LOPRESSOR) 50 MG tablet Take 1 tablet (50 mg total) by mouth 2 (two) times daily. 180 tablet 1  ? Multiple Vitamin (MULITIVITAMIN WITH MINERALS) TABS Take 1 tablet by mouth 4 (four) times a week.    ? ondansetron (ZOFRAN) 4 MG tablet Take 1 tablet (4 mg total) by mouth every 6 (six) hours as needed for nausea. 20 tablet 0  ? pantoprazole (PROTONIX) 40 MG tablet TAKE 1 TABLET BY MOUTH  DAILY 90 tablet 3  ? PARoxetine (PAXIL) 20 MG tablet TAKE 1 TABLET BY MOUTH  TWICE DAILY (Patient taking differently: Take 20 mg by mouth in the morning and at bedtime.) 180 tablet 3  ? polyethylene glycol (MIRALAX / GLYCOLAX) 17 g packet Take 17 g by mouth daily. 14 each 0  ? potassium chloride SA (KLOR-CON M) 20 MEQ tablet Take 1 tablet (20 mEq total) by mouth daily. 90 tablet 2  ? senna-docusate (SENOKOT-S) 8.6-50 MG tablet Take 1 tablet by mouth at bedtime.  (Patient taking differently: Take 1 tablet by mouth as needed.) 30 tablet 0  ? ?No current facility-administered medications for this encounter.  ? ? ?Allergies  ?Allergen Reactions  ? Morphine And Related   ?  Severe HA per pt  ? Nitrofurantoin Nausea And Vomiting  ? ? ?Social History  ? ?Socioeconomic History  ? Marital status: Divorced  ?  Spouse name: Not on file  ? Number of children: Not on file  ? Years of education: Not on file  ? Highest education level: Not on file  ?Occupational History  ? Not on file  ?Tobacco Use  ? Smoking status: Every Day  ?  Packs/day: 0.30  ?  Types: Cigarettes  ?  Last attempt to quit: 08/28/2011  ?  Years since quitting: 10.5  ? Smokeless tobacco: Never  ?Substance and Sexual Activity  ? Alcohol use: No  ? Drug use: No  ? Sexual activity: Never  ?Other Topics Concern  ? Not on file  ?Social History Narrative  ? Not on file  ? ?Social Determinants of Health  ? ?Financial Resource Strain: Low Risk   ? Difficulty of Paying Living Expenses: Not hard at all  ?Food Insecurity: No Food Insecurity  ? Worried About Charity fundraiser in the Last Year: Never true  ? Ran Out of Food in the Last Year: Never true  ?Transportation Needs: No Transportation Needs  ? Lack of Transportation (Medical): No  ? Lack of Transportation (Non-Medical): No  ?Physical Activity: Sufficiently Active  ? Days of Exercise per Week: 4 days  ? Minutes of Exercise per Session: 40 min  ?Stress: No Stress Concern Present  ? Feeling of Stress : Not at all  ?Social Connections: Moderately Isolated  ? Frequency of Communication with Friends and Family: More than three times a week  ? Frequency of Social Gatherings with Friends and Family: More than three times a week  ? Attends Religious Services: 1 to 4 times per year  ? Active Member of Clubs or Organizations: No  ? Attends Archivist Meetings: Never  ? Marital Status: Divorced  ?Intimate Partner Violence: Not At Risk  ? Fear of Current or Ex-Partner: No  ?  Emotionally Abused: No  ? Physically Abused: No  ? Sexually Abused: No  ? ? ?Family History  ?Problem Relation Age of Onset  ? Hypertension Maternal Grandmother   ? ? ?ROS- All systems are reviewed and negative except

## 2022-03-28 NOTE — Progress Notes (Signed)
? ?Primary Care Physician: Susy Frizzle, MD ?Referring Physician: Hospital F/u  ? ? ?Kristina Dougherty is a 76 y.o. female with a h/o HTN, anxiety/depression, that was admitted 12/04/21 to 12/06/21 at [facility] with new onset afib with RVR. She was rate controlled with IV Cardizem and then switched to metoprolol 50 mg bid.  She was also started on eliquis 5 mg bid for a CHA2DS2VASc  score of 4.  ? ?In the clinic on f/u, her EKG showed afib with RVR at 162 bpm. She had gained 10  lbs of fluid. She is having shortness of breath  and she stopped both eliquis and metoprolol as she felt the drugs were making her worse. She feels very full thru her abdomen which effects her appetite and has ++ pedal edema, does not describe PND/orthopnea. Her HCTZ was stopped in the hospital. She is here with her son today. She had a fall last week but was not on anticoagulation at the time.   ? ?F/u in the afib clinic, 01/03/22. She now has had on her scales at home, in her PJ's, around a 10 lb weight loss, since lasix was started last visit  here with clothes, 6 lbs. Since back on metoprolol, she is now running 117-130 bpm in afib  vrs 160bpm last week when I saw her not on metoprolol. She has been taking her eliquis on  a regular basis since last week. She feels improved but still with a lot of fatigue and shortness of breath. Still no PND/orthopnea. We discussed that she has a soft BP limiting up titration o BB. Since she has not been on anticoagulation x 3 weeks, and I am concerned she may have deterioration in her status,   will go ahead and schedule TEE/cardioversion for next Monday, which is next available. She and her son are in agreement. Still remind them to have a low threshold to go to ER if symptoms worsen.  ? ?F/u in afib clinic 03/02/22. She is feeling so much better as she is taking her lasix correctly and her weight is down around 6 lbs. She remains in rate controlled afib and now that she has loaded on amiodarone for another  month will give it one more try at cardioversion to see if can convert her to SR. She is being compliant with anticoagulation. ? ?F/u 03/28/22. Pt decided that her body needed a rest and cancelled her cardioversion after I saw her last. I reviewed with her today the her echo showed 40 to 45 % EF and with prior admits for HF  and with her being loaded on amiodarone it may be for her overall cardiac health and to prevent future  issues with heart failure if she carried out another cardioversion. She has not missed any anticoagulation. She consented to try to purse another cardioversion.She continues on amiodarone 200 mg daily. Overall she is feeling ok but tired.   ? ?Today, she denies symptoms of palpitations, chest pain, shortness of breath, orthopnea, PND, lower extremity edema, dizziness, presyncope, syncope, or neurologic sequela. The patient is tolerating medications without difficulties and is otherwise without complaint today.  ? ?Past Medical History:  ?Diagnosis Date  ? Anxiety   ? Arthritis   ? Atrial fibrillation ([place])   ? Deaf, right   ? Depression   ? Hypertension   ? Osteopenia   ? ?Past Surgical History:  ?Procedure Laterality Date  ? ABDOMINAL HYSTERECTOMY    ? APPENDECTOMY    ? BUBBLE  STUDY  01/09/2022  ? Procedure: BUBBLE STUDY;  Surgeon: [name], MD;  Location: [place];  Service: Cardiovascular;;  ? CARDIOVERSION N/A 01/09/2022  ? Procedure: CARDIOVERSION;  Surgeon: [name], MD;  Location: [place];  Service: Cardiovascular;  Laterality: N/A;  ? CARDIOVERSION N/A 01/20/2022  ? Procedure: CARDIOVERSION;  Surgeon: Jerline Pain, MD;  Location: [facility] ENDOSCOPY;  Service: Cardiovascular;  Laterality: N/A;  ? TEE WITHOUT CARDIOVERSION N/A 01/09/2022  ? Procedure: TRANSESOPHAGEAL ECHOCARDIOGRAM (TEE);  Surgeon: [name], MD;  Location: [place];  Service: Cardiovascular;  Laterality: N/A;  ? TEE WITHOUT CARDIOVERSION N/A 01/20/2022  ? Procedure: TRANSESOPHAGEAL  ECHOCARDIOGRAM (TEE);  Surgeon: Jerline Pain, MD;  Location: [facility] ENDOSCOPY;  Service: Cardiovascular;  Laterality: N/A;  ? ? ?Current Outpatient Medications  ?Medication Sig Dispense Refill  ? acetaminophen (TYLENOL) 500 MG tablet Take 500 mg by mouth every 6 (six) hours as needed (pain.).    ? ALPRAZolam (XANAX) 0.5 MG tablet TAKE 1 TABLET BY MOUTH THREE TIMES DAILY AS NEEDED FOR ANXIETY OR  SLEEP 30 tablet 0  ? amiodarone (PACERONE) 200 MG tablet Take 1 tablet (200 mg total) by mouth daily. 90 tablet 1  ? apixaban (ELIQUIS) 5 MG TABS tablet Take 1 tablet (5 mg total) by mouth 2 (two) times daily. 180 tablet 2  ? clobetasol cream (TEMOVATE) 4.94 % APPLY ONE APPLICATION TOPICALLY TWO TIMES DAILY (Patient taking differently: Apply 1 application. topically See admin instructions. APPLY ONE APPLICATION TOPICALLY TWO TIMES DAILY prn) 30 g 3  ? furosemide (LASIX) 40 MG tablet Take 1 tablet (40 mg total) by mouth daily. 30 tablet 6  ? losartan (COZAAR) 50 MG tablet Take 1 tablet (50 mg total) by mouth daily. Resume when OK with Cards and BP is better    ? metoprolol tartrate (LOPRESSOR) 50 MG tablet Take 1 tablet (50 mg total) by mouth 2 (two) times daily. 180 tablet 1  ? Multiple Vitamin (MULITIVITAMIN WITH MINERALS) TABS Take 1 tablet by mouth 4 (four) times a week.    ? ondansetron (ZOFRAN) 4 MG tablet Take 1 tablet (4 mg total) by mouth every 6 (six) hours as needed for nausea. 20 tablet 0  ? pantoprazole (PROTONIX) 40 MG tablet TAKE 1 TABLET BY MOUTH  DAILY 90 tablet 3  ? PARoxetine (PAXIL) 20 MG tablet TAKE 1 TABLET BY MOUTH  TWICE DAILY (Patient taking differently: Take 20 mg by mouth in the morning and at bedtime.) 180 tablet 3  ? polyethylene glycol (MIRALAX / GLYCOLAX) 17 g packet Take 17 g by mouth daily. 14 each 0  ? potassium chloride SA (KLOR-CON M) 20 MEQ tablet Take 1 tablet (20 mEq total) by mouth daily. 90 tablet 2  ? senna-docusate (SENOKOT-S) 8.6-50 MG tablet Take 1 tablet by mouth at bedtime.  (Patient taking differently: Take 1 tablet by mouth as needed.) 30 tablet 0  ? ?No current facility-administered medications for this encounter.  ? ? ?Allergies  ?Allergen Reactions  ? Morphine And Related   ?  Severe HA per pt  ? Nitrofurantoin Nausea And Vomiting  ? ? ?Social History  ? ?Socioeconomic History  ? Marital status: Divorced  ?  Spouse name: Not on file  ? Number of children: Not on file  ? Years of education: Not on file  ? Highest education level: Not on file  ?Occupational History  ? Not on file  ?Tobacco Use  ? Smoking status: Every Day  ?  Packs/day: 0.30  ?  Types: Cigarettes  ?  Last attempt to quit: 08/28/2011  ?  Years since quitting: 10.5  ? Smokeless tobacco: Never  ?Substance and Sexual Activity  ? Alcohol use: No  ? Drug use: No  ? Sexual activity: Never  ?Other Topics Concern  ? Not on file  ?Social History Narrative  ? Not on file  ? ?Social Determinants of Health  ? ?Financial Resource Strain: Low Risk   ? Difficulty of Paying Living Expenses: Not hard at all  ?Food Insecurity: No Food Insecurity  ? Worried About Charity fundraiser in the Last Year: Never true  ? Ran Out of Food in the Last Year: Never true  ?Transportation Needs: No Transportation Needs  ? Lack of Transportation (Medical): No  ? Lack of Transportation (Non-Medical): No  ?Physical Activity: Sufficiently Active  ? Days of Exercise per Week: 4 days  ? Minutes of Exercise per Session: 40 min  ?Stress: No Stress Concern Present  ? Feeling of Stress : Not at all  ?Social Connections: Moderately Isolated  ? Frequency of Communication with Friends and Family: More than three times a week  ? Frequency of Social Gatherings with Friends and Family: More than three times a week  ? Attends Religious Services: 1 to 4 times per year  ? Active Member of Clubs or Organizations: No  ? Attends Archivist Meetings: Never  ? Marital Status: Divorced  ?Intimate Partner Violence: Not At Risk  ? Fear of Current or Ex-Partner: No  ?  Emotionally Abused: No  ? Physically Abused: No  ? Sexually Abused: No  ? ? ?Family History  ?Problem Relation Age of Onset  ? Hypertension Maternal Grandmother   ? ? ?ROS- All systems are reviewed and negative except

## 2022-03-29 ENCOUNTER — Encounter (HOSPITAL_COMMUNITY): Payer: Self-pay | Admitting: Cardiology

## 2022-03-30 NOTE — Progress Notes (Signed)
Attempted to obtain medical history via telephone, unable to reach at this time. I left a voicemail to return pre surgical testing department's phone call.  

## 2022-03-31 ENCOUNTER — Other Ambulatory Visit: Payer: Self-pay | Admitting: Family Medicine

## 2022-03-31 NOTE — Telephone Encounter (Signed)
Requested medications are due for refill today.  yes ? ?Requested medications are on the active medications list.  yes ? ?Last refill. 02/02/2022 #30 0 refills ? ?Future visit scheduled.   yes ? ?Notes to clinic.  Medication refill is not delegated ? ? ? ?Requested Prescriptions  ?Pending Prescriptions Disp Refills  ? ALPRAZolam (XANAX) 0.5 MG tablet [Pharmacy Med Name: ALPRAZolam 0.5 MG Oral Tablet] 30 tablet 0  ?  Sig: TAKE 1 TABLET BY MOUTH THREE TIMES DAILY AS NEEDED FOR ANXIETY  ?  ? Not Delegated - Psychiatry: Anxiolytics/Hypnotics 2 Failed - 03/31/2022  2:05 PM  ?  ?  Failed - This refill cannot be delegated  ?  ?  Failed - Urine Drug Screen completed in last 360 days  ?  ?  Passed - Patient is not pregnant  ?  ?  Passed - Valid encounter within last 6 months  ?  Recent Outpatient Visits   ? ?      ? 1 month ago Atrial fibrillation, unspecified type ([place])  ? [facility], Cammie Mcgee, MD  ? 2 months ago Persistent atrial fibrillation [facility])  ? [facility], Cammie Mcgee, MD  ? 3 months ago Atrial fibrillation, unspecified type [facility])  ? [facility], Cammie Mcgee, MD  ? 3 years ago Bloating  ? [facility], Cammie Mcgee, MD  ? 3 years ago Benign essential HTN  ? [facility], Cammie Mcgee, MD  ? ?  ?  ? ? ?  ?  ?  ?  ?

## 2022-03-31 NOTE — Telephone Encounter (Signed)
LOV 02/09/22 ?Last refill 02/02/22, #30, 0 refills ? ?Please review, thanks! ? ?

## 2022-04-05 ENCOUNTER — Encounter (HOSPITAL_COMMUNITY)
Admission: RE | Disposition: A | Discharge status: Home / Self Care | Payer: Self-pay | Source: Home / Self Care | Attending: Cardiology

## 2022-04-05 ENCOUNTER — Other Ambulatory Visit: Payer: Self-pay | Admitting: Cardiology

## 2022-04-05 ENCOUNTER — Ambulatory Visit (HOSPITAL_BASED_OUTPATIENT_CLINIC_OR_DEPARTMENT_OTHER): Payer: Medicare Other | Admitting: Certified Registered Nurse Anesthetist

## 2022-04-05 ENCOUNTER — Encounter (HOSPITAL_COMMUNITY): Payer: Self-pay | Admitting: Cardiology

## 2022-04-05 ENCOUNTER — Other Ambulatory Visit: Payer: Self-pay

## 2022-04-05 ENCOUNTER — Ambulatory Visit (HOSPITAL_COMMUNITY)
Admission: RE | Admit: 2022-04-05 | Discharge: 2022-04-05 | Disposition: A | Discharge status: Home / Self Care | Payer: Medicare Other | Attending: Cardiology | Admitting: Cardiology

## 2022-04-05 ENCOUNTER — Ambulatory Visit (HOSPITAL_COMMUNITY): Payer: Medicare Other | Admitting: Certified Registered Nurse Anesthetist

## 2022-04-05 DIAGNOSIS — R001 Bradycardia, unspecified: Secondary | ICD-10-CM | POA: Diagnosis not present

## 2022-04-05 DIAGNOSIS — Z7901 Long term (current) use of anticoagulants: Secondary | ICD-10-CM | POA: Insufficient documentation

## 2022-04-05 DIAGNOSIS — F32A Depression, unspecified: Secondary | ICD-10-CM | POA: Diagnosis not present

## 2022-04-05 DIAGNOSIS — I509 Heart failure, unspecified: Secondary | ICD-10-CM | POA: Insufficient documentation

## 2022-04-05 DIAGNOSIS — I110 Hypertensive heart disease with heart failure: Secondary | ICD-10-CM | POA: Diagnosis not present

## 2022-04-05 DIAGNOSIS — I4819 Other persistent atrial fibrillation: Secondary | ICD-10-CM | POA: Insufficient documentation

## 2022-04-05 DIAGNOSIS — F418 Other specified anxiety disorders: Secondary | ICD-10-CM

## 2022-04-05 DIAGNOSIS — I480 Paroxysmal atrial fibrillation: Secondary | ICD-10-CM

## 2022-04-05 DIAGNOSIS — R931 Abnormal findings on diagnostic imaging of heart and coronary circulation: Secondary | ICD-10-CM

## 2022-04-05 DIAGNOSIS — R072 Precordial pain: Secondary | ICD-10-CM

## 2022-04-05 DIAGNOSIS — I4891 Unspecified atrial fibrillation: Secondary | ICD-10-CM

## 2022-04-05 DIAGNOSIS — K219 Gastro-esophageal reflux disease without esophagitis: Secondary | ICD-10-CM | POA: Diagnosis not present

## 2022-04-05 DIAGNOSIS — F419 Anxiety disorder, unspecified: Secondary | ICD-10-CM | POA: Diagnosis not present

## 2022-04-05 DIAGNOSIS — F17200 Nicotine dependence, unspecified, uncomplicated: Secondary | ICD-10-CM | POA: Insufficient documentation

## 2022-04-05 HISTORY — PX: CARDIOVERSION: SHX1299

## 2022-04-05 SURGERY — CARDIOVERSION
Anesthesia: General

## 2022-04-05 MED ORDER — LIDOCAINE 2% (20 MG/ML) 5 ML SYRINGE
INTRAMUSCULAR | Status: DC | PRN
  Administered 2022-04-05: 60 mg via INTRAVENOUS

## 2022-04-05 MED ORDER — PROPOFOL 10 MG/ML IV BOLUS
INTRAVENOUS | Status: DC | PRN
  Administered 2022-04-05: 50 mg via INTRAVENOUS

## 2022-04-05 MED ORDER — METOPROLOL TARTRATE 25 MG PO TABS: 25.0000 mg | ORAL_TABLET | Freq: Two times a day (BID) | ORAL | 0 refills | Status: DC

## 2022-04-05 MED ORDER — EPHEDRINE SULFATE-NACL 50-0.9 MG/10ML-% IV SOSY
PREFILLED_SYRINGE | INTRAVENOUS | Status: DC | PRN
  Administered 2022-04-05 (×2): 10 mg via INTRAVENOUS
  Administered 2022-04-05 (×2): 5 mg via INTRAVENOUS

## 2022-04-05 MED ORDER — VASOPRESSIN 20 UNIT/ML IV SOLN
INTRAVENOUS | Status: DC | PRN
  Administered 2022-04-05: 1 [IU] via INTRAVENOUS

## 2022-04-05 MED ORDER — SODIUM CHLORIDE 0.9 % IV SOLN: INTRAVENOUS | Status: DC | PRN

## 2022-04-05 NOTE — Transfer of Care (Signed)
Immediate Anesthesia Transfer of Care Note ? ?Patient: Kristina Dougherty ? ?Procedure(s) Performed: CARDIOVERSION ? ?Patient Location: Endoscopy Unit ? ?Anesthesia Type:General ? ?Level of Consciousness: awake, alert  and oriented ? ?Airway & Oxygen Therapy: Patient Spontanous Breathing ? ?Post-op Assessment: Report given to RN and Post -op Vital signs reviewed and stable ? ?Post vital signs: Reviewed and stable ? ?Last Vitals:  ?Vitals Value Taken Time  ?BP 112/54 04/05/22 1126  ?Temp    ?Pulse 47 04/05/22 1129  ?Resp 18 04/05/22 1129  ?SpO2 92 % 04/05/22 1129  ? ? ?Last Pain:  ?Vitals:  ? 04/05/22 1049  ?TempSrc: Temporal  ?PainSc: 0-No pain  ?   ? ?  ? ?Complications: No notable events documented. ?

## 2022-04-05 NOTE — Discharge Instructions (Signed)

## 2022-04-05 NOTE — Anesthesia Procedure Notes (Signed)
Procedure Name: General with mask airway ?Date/Time: 04/05/2022 11:01 AM ?Performed by: Dorthea Cove, CRNA ?Pre-anesthesia Checklist: Patient identified, Emergency Drugs available, Patient being monitored, Suction available and Timeout performed ?Patient Re-evaluated:Patient Re-evaluated prior to induction ?Oxygen Delivery Method: Simple face mask ?Preoxygenation: Pre-oxygenation with 100% oxygen ?Induction Type: IV induction ?Placement Confirmation: positive ETCO2 and CO2 detector ?Dental Injury: Teeth and Oropharynx as per pre-operative assessment  ? ? ? ? ?

## 2022-04-05 NOTE — CV Procedure (Addendum)
? ? ?  Electrical Cardioversion Procedure Note ?Kristina Dougherty ?[id number] ?Apr 10, 1946 ? ?Procedure: Electrical Cardioversion ?Indications:  Atrial Fibrillation ? ?Time Out: Verified patient identification, verified procedure,medications/allergies/relevent history reviewed, required imaging and test results available.  Performed ? ?Procedure Details ? ?The patient was NPO after midnight. Anesthesia was administered at the beside  by Dr.Witman with propofol.  Cardioversion was performed with synchronized biphasic defibrillation via AP pads with 200 joules.  1 attempt(s) were performed.  The patient converted to normal sinus rhythm. The patient tolerated the procedure well  ? ?IMPRESSION: ? ?Successful cardioversion of atrial fibrillation ? ?Bradycardia 49. Cut metoprolol to 25 BID. On AMIO as well.  ? ?Kristina Dougherty ?04/05/2022, 11:18 AM ? ?  ?

## 2022-04-05 NOTE — Interval H&P Note (Signed)
History and Physical Interval Note: ? ?04/05/2022 ?11:10 AM ? ?Kristina Dougherty  has presented today for surgery, with the diagnosis of AFIB.  The various methods of treatment have been discussed with the patient and family. After consideration of risks, benefits and other options for treatment, the patient has consented to  Procedure(s): ?CARDIOVERSION (N/A) as a surgical intervention.  The patient's history has been reviewed, patient examined, no change in status, stable for surgery.  I have reviewed the patient's chart and labs.  Questions were answered to the patient's satisfaction.   ? ? ?Kristina Dougherty ? ? ?

## 2022-04-05 NOTE — Interval H&P Note (Signed)
History and Physical Interval Note: ? ?04/05/2022 ?10:34 AM ? ?Kristina Dougherty  has presented today for surgery, with the diagnosis of AFIB.  The various methods of treatment have been discussed with the patient and family. After consideration of risks, benefits and other options for treatment, the patient has consented to  Procedure(s): ?CARDIOVERSION (N/A) as a surgical intervention.  The patient's history has been reviewed, patient examined, no change in status, stable for surgery.  I have reviewed the patient's chart and labs.  Questions were answered to the patient's satisfaction.   ? ? ?[name] ? ? ?

## 2022-04-05 NOTE — Anesthesia Preprocedure Evaluation (Signed)
Anesthesia Evaluation  ?Patient identified by MRN, date of birth, ID band ?Patient awake ? ? ? ?Reviewed: ?Allergy & Precautions, NPO status , Patient's Chart, lab work & pertinent test results ? ?History of Anesthesia Complications ?Negative for: history of anesthetic complications ? ?Airway ?Mallampati: II ? ?TM Distance: >3 FB ?Neck ROM: Full ? ? ? Dental ?  ?Pulmonary ?neg pulmonary ROS, Current Smoker,  ?  ?Pulmonary exam normal ? ? ? ? ? ? ? Cardiovascular ?hypertension, +CHF  ?+ dysrhythmias Atrial Fibrillation  ?Rhythm:Irregular  ? ?TEE 01/20/22: ??1. Left ventricular ejection fraction, by estimation, is 40 to 45%. The  ?left ventricle has mildly decreased function. The left ventricle  ?demonstrates global hypokinesis.  ??2. Right ventricular systolic function is normal. The right ventricular  ?size is normal.  ??3. Left atrial size was moderately dilated. No left atrial/left atrial  ?appendage thrombus was detected.  ??4. Right atrial size was moderately dilated.  ??5. A small pericardial effusion is present. The pericardial effusion is  ?circumferential.  ??6. The mitral valve is normal in structure. Mild mitral valve  ?regurgitation. No evidence of mitral stenosis.  ??7. Tricuspid valve regurgitation is moderate.  ??8. The aortic valve is normal in structure. Aortic valve regurgitation is  ?not visualized. No aortic stenosis is present.  ??9. The inferior vena cava is normal in size with greater than 50%  ?respiratory variability, suggesting right atrial pressure of 3 mmHg.  ?10. Evidence of atrial level shunting detected by color flow Doppler.  ?There is a small patent foramen ovale with predominantly left to right  ?shunting across the atrial septum.  ?  ?Neuro/Psych ?Anxiety Depression negative neurological ROS ?   ? GI/Hepatic ?Neg liver ROS, GERD  ,  ?Endo/Other  ?negative endocrine ROS ? Renal/GU ?Renal disease  ?negative genitourinary ?  ?Musculoskeletal ? ?(+)  Arthritis ,  ? Abdominal ?  ?Peds ? Hematology ?negative hematology ROS ?(+)   ?Anesthesia Other Findings ? ? Reproductive/Obstetrics ? ?  ? ? ? ? ? ? ? ? ? ? ? ? ? ?  ?  ? ? ? ? ? ? ? ?Anesthesia Physical ?Anesthesia Plan ? ?ASA: 3 ? ?Anesthesia Plan: General  ? ?Post-op Pain Management: Minimal or no pain anticipated  ? ?Induction: Intravenous ? ?PONV Risk Score and Plan: 3 and TIVA and Treatment may vary due to age or medical condition ? ?Airway Management Planned: Mask ? ?Additional Equipment: None ? ?Intra-op Plan:  ? ?Post-operative Plan:  ? ?Informed Consent: I have reviewed the patients History and Physical, chart, labs and discussed the procedure including the risks, benefits and alternatives for the proposed anesthesia with the patient or authorized representative who has indicated his/her understanding and acceptance.  ? ? ? ? ? ?Plan Discussed with:  ? ?Anesthesia Plan Comments:   ? ? ? ? ? ? ?Anesthesia Quick Evaluation ? ?

## 2022-04-05 NOTE — Anesthesia Postprocedure Evaluation (Signed)
Anesthesia Post Note ? ?Patient: Kristina Dougherty ? ?Procedure(s) Performed: CARDIOVERSION ? ?  ? ?Patient location during evaluation: Endoscopy ?Anesthesia Type: General ?Level of consciousness: awake and alert ?Pain management: pain level controlled ?Vital Signs Assessment: post-procedure vital signs reviewed and stable ?Respiratory status: spontaneous breathing, nonlabored ventilation and respiratory function stable ?Cardiovascular status: blood pressure returned to baseline and stable ?Postop Assessment: no apparent nausea or vomiting ?Anesthetic complications: no ? ? ?No notable events documented. ? ?Last Vitals:  ?Vitals:  ? 04/05/22 1224 04/05/22 1240  ?BP: (!) 88/45 97/76  ?Pulse: (!) 46 (!) 49  ?Resp: 12 20  ?Temp:    ?SpO2: 97% 95%  ?  ?Last Pain:  ?Vitals:  ? 04/05/22 1240  ?TempSrc:   ?PainSc: 0-No pain  ? ? ?  ?  ?  ?  ?  ?  ? ?Lidia Collum ? ? ? ? ?

## 2022-04-06 ENCOUNTER — Encounter (HOSPITAL_COMMUNITY): Payer: Self-pay | Admitting: Cardiology

## 2022-04-12 ENCOUNTER — Ambulatory Visit (HOSPITAL_COMMUNITY)
Admission: RE | Admit: 2022-04-12 | Discharge: 2022-04-12 | Disposition: A | Discharge status: Home / Self Care | Payer: Medicare Other | Source: Ambulatory Visit | Attending: Nurse Practitioner | Admitting: Nurse Practitioner

## 2022-04-12 ENCOUNTER — Encounter (HOSPITAL_COMMUNITY): Payer: Self-pay | Admitting: Nurse Practitioner

## 2022-04-12 VITALS — BP 126/68 | HR 42 | Ht 62.0 in | Wt 169.6 lb

## 2022-04-12 DIAGNOSIS — F32A Depression, unspecified: Secondary | ICD-10-CM | POA: Diagnosis not present

## 2022-04-12 DIAGNOSIS — I4819 Other persistent atrial fibrillation: Secondary | ICD-10-CM | POA: Diagnosis not present

## 2022-04-12 DIAGNOSIS — D6869 Other thrombophilia: Secondary | ICD-10-CM

## 2022-04-12 DIAGNOSIS — Z7901 Long term (current) use of anticoagulants: Secondary | ICD-10-CM | POA: Insufficient documentation

## 2022-04-12 DIAGNOSIS — F419 Anxiety disorder, unspecified: Secondary | ICD-10-CM | POA: Diagnosis not present

## 2022-04-12 DIAGNOSIS — I10 Essential (primary) hypertension: Secondary | ICD-10-CM | POA: Diagnosis not present

## 2022-04-12 DIAGNOSIS — I480 Paroxysmal atrial fibrillation: Secondary | ICD-10-CM

## 2022-04-12 MED ORDER — METOPROLOL TARTRATE 25 MG PO TABS: 12.5000 mg | ORAL_TABLET | Freq: Two times a day (BID) | ORAL | Status: DC

## 2022-04-12 NOTE — Progress Notes (Signed)
? ?Primary Care Physician: Susy Frizzle, MD ?Referring Physician: Hospital F/u  ? ? ?Kristina Dougherty is a 76 y.o. female with a h/o HTN, anxiety/depression, that was admitted 12/04/21 to 12/06/21 at [facility] with new onset afib with RVR. She was rate controlled with IV Cardizem and then switched to metoprolol 50 mg bid.  She was also started on eliquis 5 mg bid for a [id number]  score of 4.  ? ?In the clinic on f/u, her EKG showed afib with RVR at 162 bpm. She had gained 10  lbs of fluid. She is having shortness of breath  and she stopped both eliquis and metoprolol as she felt the drugs were making her worse. She feels very full thru her abdomen which effects her appetite and has ++ pedal edema, does not describe PND/orthopnea. Her HCTZ was stopped in the hospital. She is here with her son today. She had a fall last week but was not on anticoagulation at the time.   ? ?F/u in the afib clinic, 01/03/22. She now has had on her scales at home, in her PJ's, around a 10 lb weight loss, since lasix was started last visit  here with clothes, 6 lbs. Since back on metoprolol, she is now running 117-130 bpm in afib  vrs 160bpm last week when I saw her not on metoprolol. She has been taking her eliquis on  a regular basis since last week. She feels improved but still with a lot of fatigue and shortness of breath. Still no PND/orthopnea. We discussed that she has a soft BP limiting up titration o BB. Since she has not been on anticoagulation x 3 weeks, and I am concerned she may have deterioration in her status,   will go ahead and schedule TEE/cardioversion for next Monday, which is next available. She and her son are in agreement. Still remind them to have a low threshold to go to ER if symptoms worsen.  ? ?F/u in afib clinic 03/02/22. She is feeling so much better as she is taking her lasix correctly and her weight is down around 6 lbs. She remains in rate controlled afib and now that she has loaded on amiodarone for another  month will give it one more try at cardioversion to see if can convert her to SR. She is being compliant with anticoagulation. ? ?F/u 03/28/22. Pt decided that her body needed a rest and cancelled her cardioversion after I saw her last. I reviewed with her today the her echo showed 40 to 45 % EF and with prior admits for HF  and with her being loaded on amiodarone it may be for her overall cardiac health and to prevent future  issues with heart failure if she carried out another cardioversion. She has not missed any anticoagulation. She consented to try to purse another cardioversion.She continues on amiodarone 200 mg daily. Overall she is feeling ok but tired.  ? ?F/u in afib clinic, 04/12/22.  She had a successful cardioversion. Her BB was reduced 2/2 brady ata time of cardioversion but will reduce again today with HR in the 40's form which she ius not symptomatic. She feels improved in SR. She did hit her hand with a stream of water form a pressure washer 5/12. Area observed today. No  redness nor heat. One inch abrasion on thumb area, appears to be healing.  ? ?Today, she denies symptoms of palpitations, chest pain, shortness of breath, orthopnea, PND, lower extremity edema, dizziness, presyncope, syncope, or neurologic  sequela. The patient is tolerating medications without difficulties and is otherwise without complaint today.  ? ?Past Medical History:  ?Diagnosis Date  ? Anxiety   ? Arthritis   ? Atrial fibrillation ([place])   ? Deaf, right   ? Depression   ? Hypertension   ? Osteopenia   ? ?Past Surgical History:  ?Procedure Laterality Date  ? ABDOMINAL HYSTERECTOMY    ? APPENDECTOMY    ? BUBBLE STUDY  01/09/2022  ? Procedure: BUBBLE STUDY;  Surgeon: [name], MD;  Location: [place];  Service: Cardiovascular;;  ? CARDIOVERSION N/A 01/09/2022  ? Procedure: CARDIOVERSION;  Surgeon: [name], MD;  Location: [place];  Service: Cardiovascular;  Laterality: N/A;  ? CARDIOVERSION N/A 01/20/2022  ?  Procedure: CARDIOVERSION;  Surgeon: Jerline Pain, MD;  Location[facility] END[facility]COPY;  Service: Cardiovascular;  Laterality: N/A;  ? CARDIOVERSION N/A 04/05/2022  ? Procedure: CARDIOVERSION;  Surgeon: Jerline Pain, MD;  Location: [facility] ENDOSCOPY;  Service: Cardiovascular;  Laterality: N/A;  ? TEE WITHOUT CARDIOVERSION N/A 01/09/2022  ? Procedure: TRANSESOPHAGEAL ECHOCARDIOGRAM (TEE);  Surgeon: [name], MD;  Location: [place];  Service: Cardiovascular;  Laterality: N/A;  ? TEE WITHOUT CARDIOVERSION N/A 01/20/2022  ? Procedure: TRANSESOPHAGEAL ECHOCARDIOGRAM (TEE);  Surgeon: Jerline Pain, MD;  Location: [facility] ENDOSCOPY;  Service: Cardiovascular;  Laterality: N/A;  ? ? ?Current Outpatient Medications  ?Medication Sig Dispense Refill  ? acetaminophen (TYLENOL) 500 MG tablet Take 500 mg by mouth every 6 (six) hours as needed (pain.).    ? ALPRAZolam (XANAX) 0.5 MG tablet TAKE 1 TABLET BY MOUTH THREE TIMES DAILY AS NEEDED FOR ANXIETY 30 tablet 0  ? amiodarone (PACERONE) 200 MG tablet Take 1 tablet (200 mg total) by mouth daily. 90 tablet 1  ? apixaban (ELIQUIS) 5 MG TABS tablet Take 1 tablet (5 mg total) by mouth 2 (two) times daily. 180 tablet 2  ? clobetasol cream (TEMOVATE) 1.69 % APPLY ONE APPLICATION TOPICALLY TWO TIMES DAILY (Patient taking differently: Apply 1 application. topically 2 (two) times daily as needed (irritation).) 30 g 3  ? furosemide (LASIX) 40 MG tablet Take 1 tablet (40 mg total) by mouth daily. 30 tablet 6  ? losartan (COZAAR) 50 MG tablet Take 1 tablet (50 mg total) by mouth daily. Resume when OK with Cards and BP is better    ? metoprolol tartrate (LOPRESSOR) 25 MG tablet Take 1 tablet (25 mg total) by mouth 2 (two) times daily. 180 tablet 0  ? ondansetron (ZOFRAN) 4 MG tablet Take 1 tablet (4 mg total) by mouth every 6 (six) hours as needed for nausea. 20 tablet 0  ? pantoprazole (PROTONIX) 40 MG tablet TAKE 1 TABLET BY MOUTH  DAILY 90 tablet 3  ? PARoxetine (PAXIL) 20 MG tablet TAKE 1  TABLET BY MOUTH  TWICE DAILY (Patient taking differently: Take 20 mg by mouth in the morning and at bedtime.) 180 tablet 3  ? polyethylene glycol (MIRALAX / GLYCOLAX) 17 g packet Take 17 g by mouth daily. (Patient taking differently: Take 17 g by mouth daily as needed for moderate constipation.) 14 each 0  ? potassium chloride SA (KLOR-CON M) 20 MEQ tablet Take 1 tablet (20 mEq total) by mouth daily. 90 tablet 2  ? ?No current facility-administered medications for this encounter.  ? ? ?Allergies  ?Allergen Reactions  ? Morphine And Related   ?  Severe HA  ? Nitrofurantoin Nausea And Vomiting  ? ? ?Social History  ? ?Socioeconomic History  ? Marital  status: Divorced  ?  Spouse name: Not on file  ? Number of children: Not on file  ? Years of education: Not on file  ? Highest education level: Not on file  ?Occupational History  ? Not on file  ?Tobacco Use  ? Smoking status: Every Day  ?  Packs/day: 0.30  ?  Types: Cigarettes  ?  Last attempt to quit: 08/28/2011  ?  Years since quitting: 10.6  ? Smokeless tobacco: Never  ?Substance and Sexual Activity  ? Alcohol use: No  ? Drug use: No  ? Sexual activity: Never  ?Other Topics Concern  ? Not on file  ?Social History Narrative  ? Not on file  ? ?Social Determinants of Health  ? ?Financial Resource Strain: Low Risk   ? Difficulty of Paying Living Expenses: Not hard at all  ?Food Insecurity: No Food Insecurity  ? Worried About Charity fundraiser in the Last Year: Never true  ? Ran Out of Food in the Last Year: Never true  ?Transportation Needs: No Transportation Needs  ? Lack of Transportation (Medical): No  ? Lack of Transportation (Non-Medical): No  ?Physical Activity: Sufficiently Active  ? Days of Exercise per Week: 4 days  ? Minutes of Exercise per Session: 40 min  ?Stress: No Stress Concern Present  ? Feeling of Stress : Not at all  ?Social Connections: Moderately Isolated  ? Frequency of Communication with Friends and Family: More than three times a week  ? Frequency  of Social Gatherings with Friends and Family: More than three times a week  ? Attends Religious Services: 1 to 4 times per year  ? Active Member of Clubs or Organizations: No  ? Attends Club or Organizat

## 2022-04-12 NOTE — Patient Instructions (Signed)
Decrease Metoprolol Tartrate '25mg'$ - Taking 1/2 tablet by mouth twice daily ?

## 2022-04-21 ENCOUNTER — Other Ambulatory Visit: Payer: Self-pay | Admitting: Family Medicine

## 2022-04-21 DIAGNOSIS — I10 Essential (primary) hypertension: Secondary | ICD-10-CM

## 2022-04-25 NOTE — Telephone Encounter (Signed)
Requested Prescriptions  Pending Prescriptions Disp Refills  . losartan (COZAAR) 50 MG tablet [Pharmacy Med Name: Losartan Potassium 50 MG Oral Tablet] 100 tablet 2    Sig: TAKE 1 TABLET BY MOUTH ONCE DAILY     Cardiovascular:  Angiotensin Receptor Blockers Failed - 04/21/2022  5:22 AM      Failed - Cr in normal range and within 180 days    Creat  Date Value Ref Range Status  02/09/2022 1.40 (H) 0.60 - 1.00 mg/dL Final   Creatinine, Ser  Date Value Ref Range Status  03/28/2022 1.21 (H) 0.44 - 1.00 mg/dL Final         Passed - K in normal range and within 180 days    Potassium  Date Value Ref Range Status  03/28/2022 4.4 3.5 - 5.1 mmol/L Final         Passed - Patient is not pregnant      Passed - Last BP in normal range    BP Readings from Last 1 Encounters:  04/12/22 126/68         Passed - Valid encounter within last 6 months    Recent Outpatient Visits          2 months ago Atrial fibrillation, unspecified type ([place])   [place] Susy Frizzle, MD   3 months ago Persistent atrial fibrillation ([place])   [place] Pickard, Cammie Mcgee, MD   4 months ago Atrial fibrillation, unspecified type ([place])   [place] Pickard, Cammie Mcgee, MD   3 years ago Bloating   [place] Dennard Schaumann, Cammie Mcgee, MD   4 years ago Benign essential HTN   [place] Pickard, Cammie Mcgee, MD             . hydrochlorothiazide (MICROZIDE) 12.5 MG capsule [Pharmacy Med Name: hydroCHLOROthiazide 12.5 MG Oral Capsule] 100 capsule 2    Sig: TAKE 1 CAPSULE BY MOUTH  DAILY     Cardiovascular: Diuretics - Thiazide Failed - 04/21/2022  5:22 AM      Failed - Cr in normal range and within 180 days    Creat  Date Value Ref Range Status  02/09/2022 1.40 (H) 0.60 - 1.00 mg/dL Final   Creatinine, Ser  Date Value Ref Range Status  03/28/2022 1.21 (H) 0.44 - 1.00 mg/dL Final         Passed - K in normal range and  within 180 days    Potassium  Date Value Ref Range Status  03/28/2022 4.4 3.5 - 5.1 mmol/L Final         Passed - Na in normal range and within 180 days    Sodium  Date Value Ref Range Status  03/28/2022 142 135 - 145 mmol/L Final         Passed - Last BP in normal range    BP Readings from Last 1 Encounters:  04/12/22 126/68         Passed - Valid encounter within last 6 months    Recent Outpatient Visits          2 months ago Atrial fibrillation, unspecified type ([place])   [place] Susy Frizzle, MD   3 months ago Persistent atrial fibrillation ([place])   [place] Susy Frizzle, MD   4 months ago Atrial fibrillation, unspecified type [facility])   [facility] Pickard, Cammie Mcgee, MD   3 years  ago Bloating   [place] Dennard Schaumann, Cammie Mcgee, MD   4 years ago Benign essential HTN   [place], Cammie Mcgee, MD             . amLODipine (NORVASC) 10 MG tablet [Pharmacy Med Name: amLODIPine Besylate 10 MG Oral Tablet] 100 tablet 2    Sig: TAKE 1 TABLET BY MOUTH  DAILY     Cardiovascular: Calcium Channel Blockers 2 Failed - 04/21/2022  5:22 AM      Failed - Last Heart Rate in normal range    Pulse Readings from Last 1 Encounters:  04/12/22 (!) 42         Passed - Last BP in normal range    BP Readings from Last 1 Encounters:  04/12/22 126/68         Passed - Valid encounter within last 6 months    Recent Outpatient Visits          2 months ago Atrial fibrillation, unspecified type ([place])   [place] Susy Frizzle, MD   3 months ago Persistent atrial fibrillation ([place])   [place] Denn[place] Schaumann, Cammie Mcgee, MD   4 months ago Atrial fibrillation, unspecified type [facility])   [place] Pickard, Cammie Mcgee, MD   3 years ago Bloating   [place] Denn[place] Schaumann, Cammie Mcgee, MD   4 years ago Benign essential HTN   [place] Pickard, Cammie Mcgee, MD             . pantoprazole ([place]) 40 MG tablet [Pharmacy Med Name: Pantoprazole Sodium 40 MG Oral Tablet Delayed Release] 100 tablet 2    Sig: TAKE 1 TABLET BY MOUTH  DAILY     Gastroenterology: Proton Pump Inhibitors Passed - 04/21/2022  5:22 AM      Passed - Valid encounter within last 12 months    Recent Outpatient Visits          2 months ago Atrial fibrillation, unspecified type ([place])   [place] Susy Frizzle, MD   3 months ago Persistent atrial fibrillation ([place])   [place] Susy Frizzle, MD   4 months ago Atrial fibrillation, unspecified type [facility])   [place] Pickard, Cammie Mcgee, MD   3 years ago Bloating   [place] Pickard, Cammie Mcgee, MD   4 years ago Benign essential HTN   [place] Pickard, Cammie Mcgee, MD

## 2022-04-25 NOTE — Telephone Encounter (Signed)
Requested medication (s) are due for refill today: yes  Requested medication (s) are on the active medication list: no  Last refill:  02/14/22  Future visit scheduled: no  Notes to clinic:  rxs was dc'd on 12/06/21 by another provider for stop taking at dc. Please assess for refill.      Requested Prescriptions  Pending Prescriptions Disp Refills   hydrochlorothiazide (MICROZIDE) 12.5 MG capsule [Pharmacy Med Name: hydroCHLOROthiazide 12.5 MG Oral Capsule] 100 capsule 2    Sig: TAKE 1 CAPSULE BY MOUTH  DAILY     Cardiovascular: Diuretics - Thiazide Failed - 04/21/2022  5:22 AM      Failed - Cr in normal range and within 180 days    Creat  Date Value Ref Range Status  02/09/2022 1.40 (H) 0.60 - 1.00 mg/dL Final   Creatinine, Ser  Date Value Ref Range Status  03/28/2022 1.21 (H) 0.44 - 1.00 mg/dL Final         Passed - K in normal range and within 180 days    Potassium  Date Value Ref Range Status  03/28/2022 4.4 3.5 - 5.1 mmol/L Final         Passed - Na in normal range and within 180 days    Sodium  Date Value Ref Range Status  03/28/2022 142 135 - 145 mmol/L Final         Passed - Last BP in normal range    BP Readings from Last 1 Encounters:  04/12/22 126/68         Passed - Valid encounter within last 6 months    Recent Outpatient Visits           2 months ago Atrial fibrillation, unspecified type ([place])   [place] Susy Frizzle, MD   3 months ago Persistent atrial fibrillation ([place])   [place] Susy Frizzle, MD   4 months ago Atrial fibrillation, unspecified type [facility])   [place] Pickard, Cammie Mcgee, MD   3 years ago Bloating   [place] Dennard Schaumann, Cammie Mcgee, MD   4 years ago Benign essential HTN   [place], Cammie Mcgee, MD                amLODipine (NORVASC) 10 MG tablet [Pharmacy Med Name: amLODIPine Besylate 10 MG Oral Tablet] 100 tablet 2     Sig: TAKE 1 TABLET BY MOUTH  DAILY     Cardiovascular: Calcium Channel Blockers 2 Failed - 04/21/2022  5:22 AM      Failed - Last Heart Rate in normal range    Pulse Readings from Last 1 Encounters:  04/12/22 (!) 42         Passed - Last BP in normal range    BP Readings from Last 1 Encounters:  04/12/22 126/68         Passed - Valid encounter within last 6 months    Recent Outpatient Visits           2 months ago Atrial fibrillation, unspecified type ([place])   [place] Susy Frizzle, MD   3 months ago Persistent atrial fibrillation ([place])   [place] Susy Frizzle, MD   4 months ago Atrial fibrillation, unspecified type [facility])   [place] Pickard, Cammie Mcgee, MD   3 years ago Bloating   [place] Pickard, Cammie Mcgee, MD  4 years ago Benign essential HTN   [place] Pickard, Cammie Mcgee, MD               Signed Prescriptions Disp Refills   losartan (COZAAR) 50 MG tablet 100 tablet 2    Sig: TAKE 1 TABLET BY MOUTH ONCE DAILY     Cardiovascular:  Angiotensin Receptor Blockers Failed - 04/21/2022  5:22 AM      Failed - Cr in normal range and within 180 days    Creat  Date Value Ref Range Status  02/09/2022 1.40 (H) 0.60 - 1.00 mg/dL Final   Creatinine, Ser  Date Value Ref Range Status  03/28/2022 1.21 (H) 0.44 - 1.00 mg/dL Final         Passed - K in normal range and within 180 days    Potassium  Date Value Ref Range Status  03/28/2022 4.4 3.5 - 5.1 mmol/L Final         Passed - Patient is not pregnant      Passed - Last BP in normal range    BP Readings from Last 1 Encounters:  04/12/22 126/68         Passed - Valid encounter within last 6 months    Recent Outpatient Visits           2 months ago Atrial fibrillation, unspecified type ([place])   [place] Susy Frizzle, MD   3 months ago Persistent atrial fibrillation ([place])   [place] Dennard Schaumann, Cammie Mcgee, MD   4 months ago Atrial fibrillation, unspecified type [facility])   [place] Pickard, Cammie Mcgee, MD   3 years ago Bloating   [place] Dennard Schaumann, Cammie Mcgee, MD   4 years ago Benign essential HTN   [place] Pickard, Cammie Mcgee, MD                pantoprazole (PROTONIX) 40 MG tablet 100 tablet 2    Sig: TAKE 1 TABLET BY MOUTH  DAILY     Gastroenterology: Proton Pump Inhibitors Passed - 04/21/2022  5:22 AM      Passed - Valid encounter within last 12 months    Recent Outpatient Visits           2 months ago Atrial fibrillation, unspecified type ([place])   [place] Susy Frizzle, MD   3 months ago Persistent atrial fibrillation ([place])   [place] Susy Frizzle, MD   4 months ago Atrial fibrillation, unspecified type [facility])   [place] Pickard, Cammie Mcgee, MD   3 years ago Bloating   [place], Cammie Mcgee, MD   4 years ago Benign essential HTN   [place] Pickard, Cammie Mcgee, MD

## 2022-04-26 ENCOUNTER — Ambulatory Visit (HOSPITAL_COMMUNITY)
Admission: RE | Admit: 2022-04-26 | Discharge: 2022-04-26 | Disposition: A | Discharge status: Home / Self Care | Payer: Medicare Other | Source: Ambulatory Visit | Attending: Physician Assistant | Admitting: Physician Assistant

## 2022-04-26 VITALS — BP 124/96 | HR 81 | Ht 62.0 in | Wt 166.0 lb

## 2022-04-26 DIAGNOSIS — Z79899 Other long term (current) drug therapy: Secondary | ICD-10-CM | POA: Insufficient documentation

## 2022-04-26 DIAGNOSIS — I10 Essential (primary) hypertension: Secondary | ICD-10-CM | POA: Insufficient documentation

## 2022-04-26 DIAGNOSIS — F419 Anxiety disorder, unspecified: Secondary | ICD-10-CM | POA: Diagnosis not present

## 2022-04-26 DIAGNOSIS — I480 Paroxysmal atrial fibrillation: Secondary | ICD-10-CM | POA: Diagnosis not present

## 2022-04-26 DIAGNOSIS — I4819 Other persistent atrial fibrillation: Secondary | ICD-10-CM | POA: Diagnosis not present

## 2022-04-26 DIAGNOSIS — Z7901 Long term (current) use of anticoagulants: Secondary | ICD-10-CM | POA: Diagnosis not present

## 2022-04-26 DIAGNOSIS — F17210 Nicotine dependence, cigarettes, uncomplicated: Secondary | ICD-10-CM | POA: Insufficient documentation

## 2022-04-26 DIAGNOSIS — Z8249 Family history of ischemic heart disease and other diseases of the circulatory system: Secondary | ICD-10-CM | POA: Insufficient documentation

## 2022-04-26 DIAGNOSIS — D6869 Other thrombophilia: Secondary | ICD-10-CM | POA: Diagnosis not present

## 2022-04-26 NOTE — Progress Notes (Signed)
Primary Care Physician: Susy Frizzle, MD Referring Physician: Hospital F/u    Kristina Dougherty is a 76 y.o. female with a h/o HTN, anxiety/depression, that was admitted 12/04/21 to 12/06/21 at [facility] with new onset afib with RVR. She was rate controlled with IV Cardizem and then switched to metoprolol 50 mg bid.  She was also started on eliquis 5 mg bid for a CHA2DS2VASc  score of 4.   In the clinic on f/u, her EKG showed afib with RVR at 162 bpm. She had gained 10  lbs of fluid. She is having shortness of breath  and she stopped both eliquis and metoprolol as she felt the drugs were making her worse. She feels very full thru her abdomen which effects her appetite and has ++ pedal edema, does not describe PND/orthopnea. Her HCTZ was stopped in the hospital. She is here with her son today. She had a fall last week but was not on anticoagulation at the time.    F/u in the afib clinic, 01/03/22. She now has had on her scales at home, in her PJ's, around a 10 lb weight loss, since lasix was started last visit  here with clothes, 6 lbs. Since back on metoprolol, she is now running 117-130 bpm in afib  vrs 160bpm last week when I saw her not on metoprolol. She has been taking her eliquis on  a regular basis since last week. She feels improved but still with a lot of fatigue and shortness of breath. Still no PND/orthopnea. We discussed that she has a soft BP limiting up titration o BB. Since she has not been on anticoagulation x 3 weeks, and I am concerned she may have deterioration in her status,   will go ahead and schedule TEE/cardioversion for next Monday, which is next available. She and her son are in agreement. Still remind them to have a low threshold to go to ER if symptoms worsen.   F/u in afib clinic 03/02/22. She is feeling so much better as she is taking her lasix correctly and her weight is down around 6 lbs. She remains in rate controlled afib and now that she has loaded on amiodarone for another  month will give it one more try at cardioversion to see if can convert her to SR. She is being compliant with anticoagulation.  F/u 03/28/22. Pt decided that her body needed a rest and cancelled her cardioversion after I saw her last. I reviewed with her today the her echo showed 40 to 45 % EF and with prior admits for HF  and with her being loaded on amiodarone it may be for her overall cardiac health and to prevent future  issues with heart failure if she carried out another cardioversion. She has not missed any anticoagulation. She consented to try to purse another cardioversion.She continues on amiodarone 200 mg daily. Overall she is feeling ok but tired.   F/u in afib clinic, 04/12/22.  She had a successful cardioversion. Her BB was reduced 2/2 brady ata time of cardioversion but will reduce again today with HR in the 40's form which she ius not symptomatic. She feels improved in SR. She did hit her hand with a stream of water form a pressure washer 5/12. Area observed today. No  redness nor heat. One inch abrasion on thumb area, appears to be healing.   Follow up in the AF clinic 04/26/22. Unfortunately, patient is back in rate controlled afib today. She woke with palpitations after  a nightmare on 04/23/22. Overall, she is not as symptomatic with her afib this time. She does have palpitations intermittently. No symptoms of fluid overload.   Today, she denies symptoms of chest pain, shortness of breath, orthopnea, PND, lower extremity edema, dizziness, presyncope, syncope, or neurologic sequela. The patient is tolerating medications without difficulties and is otherwise without complaint today.   Past Medical History:  Diagnosis Date   Anxiety    Arthritis    Atrial fibrillation ([place])    Deaf, right    Depression    Hypertension    Osteopenia    Past Surgical History:  Procedure Laterality Date   ABDOMINAL HYSTERECTOMY     APPENDECTOMY     BUBBLE STUDY  01/09/2022   Procedure: BUBBLE STUDY;   Surgeon: [name], MD;  Location: [place];  Service: Cardiovascular;;   CARDIOVERSION N/A 01/09/2022   Procedure: CARDIOVERSION;  Surgeon: [name], MD;  Location: [place];  Service: Cardiovascular;  Laterality: N/A;   CARDIOVERSION N/A 01/20/2022   Procedure: CARDIOVERSION;  Surgeon: Jerline Pain, MD;  Location: [place];  Service: Cardiovascular;  Laterality: N/A;   CARDIOVERSION N/A 04/05/2022   Procedure: CARDIOVERSION;  Surgeon: Jerline Pain, MD;  Location: [facility] ENDOSCOPY;  Service: Cardiovascular;  Laterality: N/A;   TEE WITHOUT CARDIOVERSION N/A 01/09/2022   Procedure: TRANSESOPHAGEAL ECHOCARDIOGRAM (TEE);  Surgeon: [name], MD;  Location: [place];  Service: Cardiovascular;  Laterality: N/A;   TEE WITHOUT CARDIOVERSION N/A 01/20/2022   Procedure: TRANSESOPHAGEAL ECHOCARDIOGRAM (TEE);  Surgeon: Jerline Pain, MD;  Location: [facility] ENDOSCOPY;  Service: Cardiovascular;  Laterality: N/A;    Current Outpatient Medications  Medication Sig Dispense Refill   acetaminophen (TYLENOL) 500 MG tablet Take 500 mg by mouth every 6 (six) hours as needed (pain.).     ALPRAZolam (XANAX) 0.5 MG tablet TAKE 1 TABLET BY MOUTH THREE TIMES DAILY AS NEEDED FOR ANXIETY 30 tablet 0   amiodarone (PACERONE) 200 MG tablet Take 1 tablet (200 mg total) by mouth daily. 90 tablet 1   apixaban (ELIQUIS) 5 MG TABS tablet Take 1 tablet (5 mg total) by mouth 2 (two) times daily. 180 tablet 2   clobetasol cream (TEMOVATE) 2.95 % APPLY ONE APPLICATION TOPICALLY TWO TIMES DAILY (Patient taking differently: Apply 1 application. topically 2 (two) times daily as needed (irritation).) 30 g 3   furosemide (LASIX) 40 MG tablet Take 1 tablet (40 mg total) by mouth daily. 30 tablet 6   losartan (COZAAR) 50 MG tablet TAKE 1 TABLET BY MOUTH ONCE DAILY 100 tablet 2   metoprolol tartrate (LOPRESSOR) 25 MG tablet Take 0.5 tablets (12.5 mg total) by mouth 2 (two) times daily.     ondansetron  (ZOFRAN) 4 MG tablet Take 1 tablet (4 mg total) by mouth every 6 (six) hours as needed for nausea. 20 tablet 0   pantoprazole (PROTONIX) 40 MG tablet TAKE 1 TABLET BY MOUTH  DAILY 100 tablet 2   PARoxetine (PAXIL) 20 MG tablet TAKE 1 TABLET BY MOUTH  TWICE DAILY (Patient taking differently: Take 20 mg by mouth in the morning and at bedtime.) 180 tablet 3   polyethylene glycol (MIRALAX / GLYCOLAX) 17 g packet Take 17 g by mouth daily. (Patient taking differently: Take 17 g by mouth daily as needed for moderate constipation.) 14 each 0   potassium chloride SA (KLOR-CON M) 20 MEQ tablet Take 1 tablet (20 mEq total) by mouth daily. 90 tablet 2   No current facility-administered medications for this encounter.  Allergies  Allergen Reactions   Morphine And Related     Severe HA   Nitrofurantoin Nausea And Vomiting    Social History   Socioeconomic History   Marital status: Divorced    Spouse name: Not on file   Number of children: Not on file   Years of education: Not on file   Highest education level: Not on file  Occupational History   Not on file  Tobacco Use   Smoking status: Every Day    Packs/day: 0.30    Types: Cigarettes    Last attempt to quit: 08/28/2011    Years since quitting: 10.6   Smokeless tobacco: Never  Substance and Sexual Activity   Alcohol use: No   Drug use: No   Sexual activity: Never  Other Topics Concern   Not on file  Social History Narrative   Not on file   Social Determinants of Health   Financial Resource Strain: Low Risk    Difficulty of Paying Living Expenses: Not hard at all  Food Insecurity: No Food Insecurity   Worried About Charity fundraiser in the Last Year: Never true   [place] in the Last Year: Never true  Transportation Needs: No Transportation Needs   Lack of Transportation (Medical): No   Lack of Transportation (Non-Medical): No  Physical Activity: Sufficiently Active   Days of Exercise per Week: 4 days   Minutes of  Exercise per Session: 40 min  Stress: No Stress Concern Present   Feeling of Stress : Not at all  Social Connections: Moderately Isolated   Frequency of Communication with Friends and Family: More than three times a week   Frequency of Social Gatherings with Friends and Family: More than three times a week   Attends Religious Services: 1 to 4 times per year   Active Member of Clubs or Organizations: No   Attends Archivist Meetings: Never   Marital Status: Divorced  Human resources officer Violence: Not At Risk   Fear of Current or Ex-Partner: No   Emotionally Abused: No   Physically Abused: No   Sexually Abused: No    Family History  Problem Relation Age of Onset   Hypertension Maternal Grandmother     ROS- All systems are reviewed and negative except as per the HPI above  Physical Exam: Vitals:   04/26/22 1450  BP: (!) 124/96  Pulse: 81  Weight: 75.3 kg  Height: '5\' 2"'$  (1.575 m)    Wt Readings from Last 3 Encounters:  04/26/22 75.3 kg  04/12/22 76.9 kg  04/05/22 74.8 kg    Labs: Lab Results  Component Value Date   NA 142 03/28/2022   K 4.4 03/28/2022   CL 103 03/28/2022   CO2 30 03/28/2022   GLUCOSE 104 (H) 03/28/2022   BUN 15 03/28/2022   CREATININE 1.21 (H) 03/28/2022   CALCIUM 9.8 03/28/2022   PHOS 3.7 01/22/2022   MG 2.1 02/09/2022   No results found for: INR Lab Results  Component Value Date   CHOL 237 (H) 11/22/2016   HDL 71 11/22/2016   LDLCALC 148 (H) 11/22/2016   TRIG 90 11/22/2016     GEN- The patient is a well appearing elderly female, alert and oriented x 3 today.   HEENT-head normocephalic, atraumatic, sclera clear, conjunctiva pink, hearing intact, trachea midline. Lungs- Clear to ausculation bilaterally, normal work of breathing Heart- irregular rate and rhythm, no murmurs, rubs or gallops  GI- soft, NT, ND, +  BS Extremities- no clubbing, cyanosis, or edema MS- no significant deformity or atrophy Skin- no rash or lesion Psych-  euthymic mood, full affect Neuro- strength and sensation are intact   EKG  Coarse Afib Vent. rate 81 BPM PR interval * ms QRS duration 74 ms QT/QTcB 402/466 ms  Echo-    1. Left ventricular ejection fraction, by estimation, is 40 to 45%. The left ventricle has mildly decreased function. The left ventricle demonstrates global hypokinesis. Left ventricular diastolic parameters are indeterminate.   2. Right ventricular systolic function is normal. The right ventricular size is mildly enlarged. There is normal pulmonary artery systolic pressure. The estimated right ventricular systolic pressure is 85.2 mmHg.   3. The pericardial effusion is circumferential. There is no evidence of cardiac tamponade.   4. The mitral valve is normal in structure. Mild mitral valve  regurgitation. No evidence of mitral stenosis.   5. Tricuspid valve regurgitation is moderate.   6. The aortic valve is normal in structure. Aortic valve regurgitation is not visualized. No aortic stenosis is present.   7. The inferior vena cava is dilated in size with <50% respiratory  variability, suggesting right atrial pressure of 15 mmHg.   Comparison(s): Prior images reviewed side by side. The left ventricular function is worsened.    Assessment and Plan:  1. Persistent afib  S/p DCCV 04/05/22 with early return of afib. We discussed rhythm control options today. I don't feel additional cardioversion would be helpful. She did have normal size LA mildly reduced EF on previous echo. She may be a good ablation candidate. Patient agreeable to consultation with EP, will refer.  Continue Lopressor 12. 5 mg BID Continue amiodarone 200 mg daily  Continue Eliquis 5 mg BID  2. CHA2DS2VASc  score of at least 4  Eliquis  5 mg bid    3. HTN Stable, no changes today.   Follow up with EP for ablation consideration.    [facility] [address] [place], [place] [address] [phone]

## 2022-05-01 ENCOUNTER — Ambulatory Visit (INDEPENDENT_AMBULATORY_CARE_PROVIDER_SITE_OTHER): Payer: Medicare Other | Admitting: *Deleted

## 2022-05-01 DIAGNOSIS — I10 Essential (primary) hypertension: Secondary | ICD-10-CM

## 2022-05-01 DIAGNOSIS — I5021 Acute systolic (congestive) heart failure: Secondary | ICD-10-CM

## 2022-05-01 DIAGNOSIS — I4819 Other persistent atrial fibrillation: Secondary | ICD-10-CM

## 2022-05-01 NOTE — Chronic Care Management (AMB) (Signed)
Chronic Care Management   CCM RN Visit Note  05/01/2022 Name: Kristina Dougherty MRN: [id number] DOB: [birth year]  Subjective: Kristina Dougherty is a 76 y.o. year old female who is a primary care patient of Pickard, Cammie Mcgee, MD. The care management team was consulted for assistance with disease management and care coordination needs.    Engaged with patient by telephone for follow up visit in response to provider referral for case management and/or care coordination services.   Consent to Services:  The patient was given information about Chronic Care Management services, agreed to services, and gave verbal consent prior to initiation of services.  Please see initial visit note for detailed documentation.   Patient agreed to services and verbal consent obtained.   Assessment: Review of patient past medical history, allergies, medications, health status, including review of consultants reports, laboratory and other test data, was performed as part of comprehensive evaluation and provision of chronic care management services.   SDOH (Social Determinants of Health) assessments and interventions performed:    CCM Care Plan  Allergies  Allergen Reactions   Morphine And Related     Severe HA   Nitrofurantoin Nausea And Vomiting    Outpatient Encounter Medications as of 05/01/2022  Medication Sig   acetaminophen (TYLENOL) 500 MG tablet Take 500 mg by mouth every 6 (six) hours as needed (pain.).   ALPRAZolam (XANAX) 0.5 MG tablet TAKE 1 TABLET BY MOUTH THREE TIMES DAILY AS NEEDED FOR ANXIETY   amiodarone (PACERONE) 200 MG tablet Take 1 tablet (200 mg total) by mouth daily.   apixaban (ELIQUIS) 5 MG TABS tablet Take 1 tablet (5 mg total) by mouth 2 (two) times daily.   clobetasol cream (TEMOVATE) 4.85 % APPLY ONE APPLICATION TOPICALLY TWO TIMES DAILY (Patient taking differently: Apply 1 application. topically 2 (two) times daily as needed (irritation).)   furosemide (LASIX) 40 MG tablet Take 1  tablet (40 mg total) by mouth daily.   losartan (COZAAR) 50 MG tablet TAKE 1 TABLET BY MOUTH ONCE DAILY   metoprolol tartrate (LOPRESSOR) 25 MG tablet Take 0.5 tablets (12.5 mg total) by mouth 2 (two) times daily.   ondansetron (ZOFRAN) 4 MG tablet Take 1 tablet (4 mg total) by mouth every 6 (six) hours as needed for nausea.   PARoxetine (PAXIL) 20 MG tablet TAKE 1 TABLET BY MOUTH  TWICE DAILY (Patient taking differently: Take 20 mg by mouth in the morning and at bedtime.)   polyethylene glycol (MIRALAX / GLYCOLAX) 17 g packet Take 17 g by mouth daily. (Patient taking differently: Take 17 g by mouth daily as needed for moderate constipation.)   potassium chloride SA (KLOR-CON M) 20 MEQ tablet Take 1 tablet (20 mEq total) by mouth daily.   pantoprazole (PROTONIX) 40 MG tablet TAKE 1 TABLET BY MOUTH  DAILY   No facility-administered encounter medications on file as of 05/01/2022.    Patient Active Problem List   Diagnosis Date Noted   Secondary hypercoagulable state ([place]) 04/26/2022   PAF (paroxysmal atrial fibrillation) (HCC)    Thrombocytopenia (HCC) 01/21/2022   Pleural effusion due to CHF (congestive heart failure) ([place]) 01/19/2022   Obesity (BMI 30-39.9) 01/18/2022   Pericardial effusion 46/27/0350   Acute systolic CHF (congestive heart failure) ([place]) 01/17/2022   AKI (acute kidney injury) ([place]) 01/17/2022   Hypomagnesemia 01/17/2022   Abnormal thyroid function test 01/17/2022   Constipation 01/17/2022   GERD (gastroesophageal reflux disease) 01/17/2022   Persistent atrial fibrillation (HCC)    Bleeding  per rectum 12/15/2021   Change in bowel habit 12/15/2021   Diarrhea 12/15/2021   First degree hemorrhoids 12/15/2021   Flatulence, eructation and gas pain 12/15/2021   Irritable bowel syndrome with diarrhea 12/15/2021   Hypokalemia 12/05/2021   Atrial fibrillation with rapid ventricular response ([place]) 12/04/2021   Physical exam, annual 08/14/2012   C. difficile colitis 11/15/7587    Lichen sclerosus et atrophicus 04/23/2012   HYPERTENSION, MILD 11/25/2009   ARTHRITIS, KNEE 10/07/2009   ACUTE BRONCHITIS 09/21/2009   ARTHRITIS, KNEES, BILATERAL 09/21/2009   UTI 02/18/2009   EDEMA 02/18/2009   BURSITIS, RIGHT SHOULDER 11/04/2008   TOBACCO USE, QUIT 11/04/2008   Essential hypertension 11/14/2007   ANXIETY DEPRESSION 11/06/2007   SCIATICA 11/06/2007    Conditions to be addressed/monitored:Atrial Fibrillation, CHF, and HTN  Care Plan : RN Care Manager Plan of Care  Updates made by Kassie Mends, RN since 05/01/2022 12:00 AM     Problem: No plan of care established for management of chronic disease state  (Atrial Fibrillation, CHF, HTN)   Priority: High     Long-Range Goal: Development of plan of care for chronic disease management  (Atrial Fibrillation, CHF, HTN)   Start Date: 01/27/2022  Expected End Date: 10/28/2022  Priority: High  Note:   Current Barriers:  Knowledge Deficits related to plan of care for management of Atrial Fibrillation, CHF, and HTN  No Advanced Directives in place- pt declines information Patient reports she lives alone, has friend that lives nearby that transports her to appointments and to pickup medications and groceries, pt reports she has a car and is now driving again, feels she is getting stronger. Pt reports she has blood pressure cuff but does not check and will ask her friend to assist to check blood pressure on occasion, update- still not checking blood pressure, has scale and does weigh daily with today's weight 166 pounds. Pt hospitalized 01/17/22 diagnosis atrial fibrillation, CHF exacerbation with pleural effusion left side. Pt reports she has no needs for social worker or pharmacist at this time.  Pt reports she has all medications and taking as prescribed, states in the future she may switch to a pharmacy that delivers but states " I'm not gonna do this right now"  Update- patient reports she had cardioversion recently and "it  didn't last so I'm seeing a cardiologist on 06/05/22 for evaluation for ablation"  pt reports she has not been having symptoms recently (only on occasion) and does not feel nearly as bad as she did before having cardioversion.  RNCM Clinical Goal(s):  Patient will verbalize understanding of plan for management of Atrial Fibrillation, CHF, and HTN as evidenced by patient report, review of EHR and  through collaboration with RN Care manager, provider, and care team.   Interventions: 1:1 collaboration with primary care provider regarding development and update of comprehensive plan of care as evidenced by provider attestation and co-signature Inter-disciplinary care team collaboration (see longitudinal plan of care) Evaluation of current treatment plan related to  self management and patient's adherence to plan as established by provider   AFIB Interventions: (Status:  New goal. and Goal on track:  Yes.) Long Term Goal   Counseled on increased risk of stroke due to Afib and benefits of anticoagulation for stroke prevention Reviewed importance of adherence to anticoagulant exactly as prescribed Counseled on importance of regular laboratory monitoring as prescribed Afib action plan reviewed Reinforced importance of doing some type of exercise, walking is good Reviewed importance of checking pulse  daily Reviewed upcoming scheduled appointments   Heart Failure Interventions:  (Status:  New goal. and Goal on track:  Yes.) Long Term Goal Provided education on low sodium diet Discussed importance of daily weight and advised patient to weigh and record daily Provided patient with education about the role of exercise in the management of heart failure Reviewed Heart Failure action plan Reviewed weight log with patient  Hypertension Interventions:  (Status:  New goal. and Goal on track:  Yes.) Long Term Goal Last practice recorded BP readings:  BP Readings from Last 3 Encounters:  01/22/22 121/69   01/17/22 (!) 124/98  01/12/22 94/68  Most recent eGFR/CrCl:  Lab Results  Component Value Date   EGFR 72 12/16/2021    No components found for: CRCL  Evaluation of current treatment plan related to hypertension self management and patient's adherence to plan as established by provider Reviewed medications with patient and discussed importance of compliance Discussed complications of poorly controlled blood pressure such as heart disease, stroke, circulatory complications, vision complications, kidney impairment, sexual dysfunction Reinforced importance of following low sodium diet   Patient Goals/Self-Care Activities: Take medications as prescribed   Attend all scheduled provider appointments Call pharmacy for medication refills 3-7 days in advance of running out of medications Perform all self care activities independently  Perform IADL's (shopping, preparing meals, housekeeping, managing finances) independently Call provider office for new concerns or questions  call office if I gain more than 2 pounds in one day or 5 pounds in one week track weight in diary use salt in moderation watch for swelling in feet, ankles and legs every day eat more whole grains, fruits and vegetables, Kristina meats and healthy fats track symptoms and what helps feel better or worse dress right for the weather, hot or cold - check pulse (heart) rate once a day - make a plan to exercise regularly - make a plan to eat healthy - keep all lab appointments - take medicine as prescribed check blood pressure weekly choose a place to take my blood pressure (home, clinic or office, retail store) write blood pressure results in a log or diary take blood pressure log to all doctor appointments take medications for blood pressure exactly as prescribed Follow Heart Failure and Atrial Fibrillation action plan, call your doctor early on for change in health status, symptoms Check your pulse daily Call RN care  manager for any questions at [phone] Follow up with cardiologist on 06/05/22       Plan:Telephone follow up appointment with care management team member scheduled for:  06/26/22  Jacqlyn Larsen [facility], BSN RN Case Manager [facility] [phone]

## 2022-05-01 NOTE — Patient Instructions (Signed)
Visit Information  Thank you for taking time to visit with me today. Please don't hesitate to contact me if I can be of assistance to you before our next scheduled telephone appointment.  Following are the goals we discussed today:  Take medications as prescribed   Attend all scheduled provider appointments Call pharmacy for medication refills 3-7 days in advance of running out of medications Perform all self care activities independently  Perform IADL's (shopping, preparing meals, housekeeping, managing finances) independently Call provider office for new concerns or questions  call office if I gain more than 2 pounds in one day or 5 pounds in one week track weight in diary use salt in moderation watch for swelling in feet, ankles and legs every day eat more whole grains, fruits and vegetables, lean meats and healthy fats track symptoms and what helps feel better or worse dress right for the weather, hot or cold check pulse (heart) rate once a day make a plan to exercise regularly make a plan to eat healthy keep all lab appointments take medicine as prescribed check blood pressure weekly choose a place to take my blood pressure (home, clinic or office, retail store) write blood pressure results in a log or diary take blood pressure log to all doctor appointments take medications for blood pressure exactly as prescribed Follow Heart Failure and Atrial Fibrillation action plan, call your doctor early on for change in health status, symptoms Check your pulse daily Call RN care manager for any questions at [phone] Follow up with cardiologist on 06/05/22  Our next appointment is by telephone on  06/26/22 at 130 pm  Please call the care guide team at [phone] if you need to cancel or reschedule your appointment.   If you are experiencing a Mental Health or [place] or need someone to talk to, please call the Suicide and Crisis Lifeline: 988 call the [place]  National Suicide Prevention Lifeline: (903)353-0470 or TTY: (986) 562-9078 TTY (641)230-2358) to talk to a trained counselor call 1-800-273-TALK (toll free, 24 hour hotline) go to [facility] [address] [place] [id number]) call 911   The patient verbalized understanding of instructions, educational materials, and care plan provided today and DECLINED offer to receive copy of patient instructions, educational materials, and care plan.   Jacqlyn Larsen RNC, BSN RN Case Manager [facility] [phone]

## 2022-05-22 ENCOUNTER — Other Ambulatory Visit (HOSPITAL_COMMUNITY): Payer: Self-pay | Admitting: Nurse Practitioner

## 2022-05-26 DIAGNOSIS — I509 Heart failure, unspecified: Secondary | ICD-10-CM | POA: Diagnosis not present

## 2022-05-26 DIAGNOSIS — I4891 Unspecified atrial fibrillation: Secondary | ICD-10-CM

## 2022-05-26 DIAGNOSIS — I110 Hypertensive heart disease with heart failure: Secondary | ICD-10-CM

## 2022-06-04 NOTE — Progress Notes (Signed)
Electrophysiology Office Note   Date:  06/05/2022   ID:  Kristina Dougherty, Kristina Dougherty 03/15/1946, MRN [id number]  PCP:  Donita Brooks, MD  Cardiologist:  Mayford Knife Primary Electrophysiologist:  Kristina Dougherty Kristina Loa, MD    Chief Complaint: AF   History of Present Illness: Kristina Dougherty is a 76 y.o. female who is being seen today for the evaluation of AF at the request of Kristina Dougherty, Kristina R, PA. Presenting today for electrophysiology evaluation.  She has a history significant for hypertension, anxiety/depression.  She was admitted to the hospital January 2023 with new onset rapid atrial fibrillation.  She has had a few return visits to atrial fibrillation clinic with rapid episodes of atrial fibrillation.  She had a cardioversion, but unfortunately has since gone back into atrial fibrillation.  She has palpitations, weakness, fatigue due to her atrial fibrillation.  Today, she denies symptoms of palpitations, chest pain, orthopnea, PND, lower extremity edema, claudication, dizziness, presyncope, syncope, bleeding, or neurologic sequela. The patient is tolerating medications without difficulties.  She complains of intermittent weakness, fatigue, shortness of breath.  She attributes this to her atrial fibrillation episodes.  She has no chest pain.  She feels worse when her heart rate is fast, though she does feel that she is not consistently back in normal rhythm.   Past Medical History:  Diagnosis Date   Anxiety    Arthritis    Atrial fibrillation (HCC)    Deaf, right    Depression    Hypertension    Osteopenia    Past Surgical History:  Procedure Laterality Date   ABDOMINAL HYSTERECTOMY     APPENDECTOMY     BUBBLE STUDY  01/09/2022   Procedure: BUBBLE STUDY;  Surgeon: Chilton Si, MD;  Location: [facility] ENDOSCOPY;  Service: Cardiovascular;;   CARDIOVERSION N/A 01/09/2022   Procedure: CARDIOVERSION;  Surgeon: Chilton Si, MD;  Location: [facility] ENDOSCOPY;  Service: Cardiovascular;   Laterality: N/A;   CARDIOVERSION N/A 01/20/2022   Procedure: CARDIOVERSION;  Surgeon: Jake Bathe, MD;  Location: [facility] ENDOSCOPY;  Service: Cardiovascular;  Laterality: N/A;   CARDIOVERSION N/A 04/05/2022   Procedure: CARDIOVERSION;  Surgeon: Jake Bathe, MD;  Location: [facility] ENDOSCOPY;  Service: Cardiovascular;  Laterality: N/A;   TEE WITHOUT CARDIOVERSION N/A 01/09/2022   Procedure: TRANSESOPHAGEAL ECHOCARDIOGRAM (TEE);  Surgeon: Chilton Si, MD;  Location: [facility] ENDOSCOPY;  Service: Cardiovascular;  Laterality: N/A;   TEE WITHOUT CARDIOVERSION N/A 01/20/2022   Procedure: TRANSESOPHAGEAL ECHOCARDIOGRAM (TEE);  Surgeon: Jake Bathe, MD;  Location: [facility] ENDOSCOPY;  Service: Cardiovascular;  Laterality: N/A;     Current Outpatient Medications  Medication Sig Dispense Refill   acetaminophen (TYLENOL) 500 MG tablet Take 500 mg by mouth every 6 (six) hours as needed (pain.).     ALPRAZolam (XANAX) 0.5 MG tablet TAKE 1 TABLET BY MOUTH THREE TIMES DAILY AS NEEDED FOR ANXIETY 30 tablet 0   amiodarone (PACERONE) 200 MG tablet Take 1 tablet (200 mg total) by mouth daily. 90 tablet 1   apixaban (ELIQUIS) 5 MG TABS tablet Take 1 tablet (5 mg total) by mouth 2 (two) times daily. 180 tablet 2   clobetasol cream (TEMOVATE) 0.05 % APPLY ONE APPLICATION TOPICALLY TWO TIMES DAILY (Patient taking differently: Apply 1 application. topically 2 (two) times daily as needed (irritation).) 30 g 3   furosemide (LASIX) 40 MG tablet Take 1 tablet (40 mg total) by mouth daily. 30 tablet 6   losartan (COZAAR) 50 MG tablet TAKE 1 TABLET BY MOUTH ONCE DAILY  100 tablet 2   metoprolol tartrate (LOPRESSOR) 25 MG tablet Take 0.5 tablets (12.5 mg total) by mouth 2 (two) times daily.     ondansetron (ZOFRAN) 4 MG tablet Take 1 tablet (4 mg total) by mouth every 6 (six) hours as needed for nausea. 20 tablet 0   pantoprazole (PROTONIX) 40 MG tablet TAKE 1 TABLET BY MOUTH  DAILY 100 tablet 2   PARoxetine (PAXIL) 20 MG tablet TAKE  1 TABLET BY MOUTH  TWICE DAILY (Patient taking differently: Take 20 mg by mouth in the morning and at bedtime.) 180 tablet 3   polyethylene glycol (MIRALAX / GLYCOLAX) 17 g packet Take 17 g by mouth daily. (Patient taking differently: Take 17 g by mouth daily as needed for moderate constipation.) 14 each 0   potassium chloride SA (KLOR-CON M) 20 MEQ tablet Take 1 tablet (20 mEq total) by mouth daily. 90 tablet 2   No current facility-administered medications for this visit.    Allergies:   Morphine and related and Nitrofurantoin   Social History:  The patient  reports that she quit smoking about 9 months ago. Her smoking use included cigarettes. She smoked an average of .3 packs per day. She has never used smokeless tobacco. She reports that she does not drink alcohol and does not use drugs.   Family History:  The patient's family history includes Hypertension in her maternal grandmother.    ROS:  Please see the history of present illness.   Otherwise, review of systems is positive for none.   All other systems are reviewed and negative.    PHYSICAL EXAM: VS:  BP 100/64   Pulse (!) 104   Ht 5\' 2"  (1.575 m)   Wt 161 lb 12.8 oz (73.4 kg)   SpO2 96%   BMI 29.59 kg/m  , BMI Body mass index is 29.59 kg/m. GEN: Well nourished, well developed, in no acute distress  HEENT: normal  Neck: no JVD, carotid bruits, or masses Cardiac: irregular; no murmurs, rubs, or gallops,no edema  Respiratory:  clear to auscultation bilaterally, normal work of breathing GI: soft, nontender, nondistended, + BS MS: no deformity or atrophy  Skin: warm and dry Neuro:  Strength and sensation are intact Psych: euthymic mood, full affect  EKG:  EKG is ordered today. Personal review of the ekg ordered shows atrial fibrillation, rate 104  Recent Labs: 01/19/2022: B Natriuretic Peptide 263.3 02/09/2022: ALT 16; Magnesium 2.1; TSH 4.14 03/28/2022: BUN 15; Creatinine, Ser 1.21; Hemoglobin 15.3; Platelets 225;  Potassium 4.4; Sodium 142    Lipid Panel     Component Value Date/Time   CHOL 237 (H) 11/22/2016 1451   TRIG 90 11/22/2016 1451   HDL 71 11/22/2016 1451   CHOLHDL 3.3 11/22/2016 1451   VLDL 18 11/22/2016 1451   LDLCALC 148 (H) 11/22/2016 1451   LDLDIRECT 127.0 08/14/2012 1558     Wt Readings from Last 3 Encounters:  06/05/22 161 lb 12.8 oz (73.4 kg)  04/26/22 166 lb (75.3 kg)  04/12/22 169 lb 9.6 oz (76.9 kg)      Other studies Reviewed: Additional studies/ records that were reviewed today include: TEE 03/14/22  Review of the above records today demonstrates:   1. Left ventricular ejection fraction, by estimation, is 55 to 60%. The  left ventricle has normal function. The left ventricle has no regional  wall motion abnormalities.   2. Right ventricular systolic function is normal. The right ventricular  size is normal.   3. No left  atrial/left atrial appendage thrombus was detected.   4. The mitral valve is normal in structure. Trivial mitral valve  regurgitation. No evidence of mitral stenosis.   5. The aortic valve is tricuspid. Aortic valve regurgitation is not  visualized. No aortic stenosis is present.   6. The inferior vena cava is normal in size with greater than 50%  respiratory variability, suggesting right atrial pressure of 3 mmHg.   7. Evidence of atrial level shunting detected by color flow Doppler.  Agitated saline contrast bubble study was positive with shunting observed  within 3-6 cardiac cycles suggestive of interatrial shunt.    ASSESSMENT AND PLAN:  1.  Persistent atrial fibrillation: Normal left atrial size.  She has had a cardioversion but is unfortunately gone back into atrial fibrillation.  Currently on metoprolol 12.5 mg twice daily, amiodarone 200 mg daily, Eliquis 5 mg twice daily.  CHA2DS2-VASc of 4.  She would prefer a alternative rhythm control strategy to amiodarone.  Due to that, we Manami Tutor plan for ablation.  Risk, benefits, and alternatives  to EP study and radiofrequency ablation for afib were also discussed in detail today. These risks include but are not limited to stroke, bleeding, vascular damage, tamponade, perforation, damage to the esophagus, lungs, and other structures, pulmonary vein stenosis, worsening renal function, and death. The patient understands these risk and wishes to proceed.  We [place]row therefore proceed with catheter ablation at the next available time.  Carto, ICE, anesthesia are requested for the procedure.  Jaleah Lefevre also obtain CT PV protocol prior to the procedure to exclude LAA thrombus and further evaluate atrial anatomy.   2.  Secondary hypercoagulable state: Currently on Eliquis for atrial fibrillation as above.  3.  Hypertension: Currently well controlled   Current medicines are reviewed at length with the patient today.   The patient does not have concerns regarding her medicines.  The following changes were made today:  none  Labs/ tests ordered today include:  Orders Placed This Encounter  Procedures   CT CARDIAC MORPH/PULM VEIN W/CM&W/O CA SCORE   Basic metabolic panel   CBC   EKG 12-Lead     Disposition:   FU with Shuronda Santino 3 months  Signed, Domenik Trice Kristina Loa, MD  06/05/2022 10:59 AM     [facility] [address] [place] [address] [phone] (office) [phone] (fax)

## 2022-06-05 ENCOUNTER — Encounter: Payer: Self-pay | Admitting: *Deleted

## 2022-06-05 ENCOUNTER — Encounter: Payer: Self-pay | Admitting: Cardiology

## 2022-06-05 ENCOUNTER — Ambulatory Visit (INDEPENDENT_AMBULATORY_CARE_PROVIDER_SITE_OTHER): Payer: Medicare Other | Admitting: Cardiology

## 2022-06-05 VITALS — BP 100/64 | HR 104 | Ht 62.0 in | Wt 161.8 lb

## 2022-06-05 DIAGNOSIS — Z01812 Encounter for preprocedural laboratory examination: Secondary | ICD-10-CM | POA: Diagnosis not present

## 2022-06-05 DIAGNOSIS — I480 Paroxysmal atrial fibrillation: Secondary | ICD-10-CM | POA: Diagnosis not present

## 2022-06-05 DIAGNOSIS — I4819 Other persistent atrial fibrillation: Secondary | ICD-10-CM

## 2022-06-05 DIAGNOSIS — D6869 Other thrombophilia: Secondary | ICD-10-CM

## 2022-06-05 NOTE — Patient Instructions (Signed)
Medication Instructions:  Your physician recommends that you continue on your current medications as directed. Please refer to the Current Medication list given to you today.  *If you need a refill on your cardiac medications before your next appointment, please call your pharmacy*   Lab Work: Pre procedure labs -- see procedure instruction letter:  BMP & CBC  If you have labs (blood work) drawn today and your tests are completely normal, you will receive your results only by: [place] (if you have MyChart) OR A paper copy in the mail If you have any lab test that is abnormal or we need to change your treatment, we will call you to review the results.   Testing/Procedures: Your physician has requested that you have cardiac CT within 7 days PRIOR to your ablation. Cardiac computed tomography (CT) is a painless test that uses an x-ray machine to take clear, detailed pictures of your heart.  Please follow instruction below located under "other instructions". You will get a call from our office to schedule the date for this test.  Your physician has recommended that you have an ablation. Catheter ablation is a medical procedure used to treat some cardiac arrhythmias (irregular heartbeats). During catheter ablation, a long, thin, flexible tube is put into a blood vessel in your groin (upper thigh), or neck. This tube is called an ablation catheter. It is then guided to your heart through the blood vessel. Radio frequency waves destroy small areas of heart tissue where abnormal heartbeats may cause an arrhythmia to start. Please follow instruction letter given to you today.   Follow-Up: At [facility], you and your health needs are our priority.  As part of our continuing mission to provide you with exceptional heart care, we have created designated Provider Care Teams.  These Care Teams include your primary Cardiologist (physician) and Advanced Practice Providers (APPs -  Physician  Assistants and Nurse Practitioners) who all work together to provide you with the care you need, when you need it.  We recommend signing up for the patient portal called "MyChart".  Sign up information is provided on this After Visit Summary.  MyChart is used to connect with patients for Virtual Visits (Telemedicine).  Patients are able to view lab/test results, encounter notes, upcoming appointments, etc.  Non-urgent messages can be sent to your provider as well.   To learn more about what you can do with MyChart, go to [email or web address].    Your next appointment:   1 month(s) after your ablation  The format for your next appointment:   In Person  Provider:   AFib clinic   Thank you for choosing [facility]!!   Trinidad Curet, RN [phone]    Other Instructions                           Cardiac Ablation Cardiac ablation is a procedure to destroy (ablate) some heart tissue that is sending bad signals. These bad signals cause problems in heart rhythm. The heart has many areas that make these signals. If there are problems in these areas, they can make the heart beat in a way that is not normal. Destroying some tissues can help make the heart rhythm normal. Tell your doctor about: Any allergies you have. All medicines you are taking. These include vitamins, herbs, eye drops, creams, and over-the-counter medicines. Any problems you or family members have had with medicines that make you fall asleep (anesthetics). Any blood  disorders you have. Any surgeries you have had. Any medical conditions you have, such as kidney failure. Whether you are pregnant or may be pregnant. What are the risks? This is a safe procedure. But problems may occur, including: Infection. Bruising and bleeding. Bleeding into the chest. Stroke or blood clots. Damage to nearby areas of your body. Allergies to medicines or dyes. The need for a pacemaker if the normal system is damaged. Failure  of the procedure to treat the problem. What happens before the procedure? Medicines Ask your doctor about: Changing or stopping your normal medicines. This is important. Taking aspirin and ibuprofen. Do not take these medicines unless your doctor tells you to take them. Taking other medicines, vitamins, herbs, and supplements. General instructions Follow instructions from your doctor about what you cannot eat or drink. Plan to have someone take you home from the hospital or clinic. If you will be going home right after the procedure, plan to have someone with you for 24 hours. Ask your doctor what steps will be taken to prevent infection. What happens during the procedure?  An IV tube will be put into one of your veins. You will be given a medicine to help you relax. The skin on your neck or groin will be numbed. A cut (incision) will be made in your neck or groin. A needle will be put through your cut and into a large vein. A tube (catheter) will be put into the needle. The tube will be moved to your heart. Dye may be put through the tube. This helps your doctor see your heart. Small devices (electrodes) on the tube will send out signals. A type of energy will be used to destroy some heart tissue. The tube will be taken out. Pressure will be held on your cut. This helps stop bleeding. A bandage will be put over your cut. The exact procedure may vary among doctors and hospitals. What happens after the procedure? You will be watched until you leave the hospital or clinic. This includes checking your heart rate, breathing rate, oxygen, and blood pressure. Your cut will be watched for bleeding. You will need to lie still for a few hours. Do not drive for 24 hours or as long as your doctor tells you. Summary Cardiac ablation is a procedure to destroy some heart tissue. This is done to treat heart rhythm problems. Tell your doctor about any medical conditions you may have. Tell him or her  about all medicines you are taking to treat them. This is a safe procedure. But problems may occur. These include infection, bruising, bleeding, and damage to nearby areas of your body. Follow what your doctor tells you about food and drink. You may also be told to change or stop some of your medicines. After the procedure, do not drive for 24 hours or as long as your doctor tells you. This information is not intended to replace advice given to you by your health care provider. Make sure you discuss any questions you have with your health care provider. Document Revised: 10/16/2019 Document Reviewed: 10/16/2019 Elsevier Patient Education  [place].

## 2022-06-06 ENCOUNTER — Other Ambulatory Visit: Payer: Self-pay | Admitting: Cardiology

## 2022-06-06 ENCOUNTER — Other Ambulatory Visit: Payer: Self-pay | Admitting: Family Medicine

## 2022-06-07 NOTE — Telephone Encounter (Signed)
Requested Prescriptions  Pending Prescriptions Disp Refills  . PARoxetine (PAXIL) 20 MG tablet [Pharmacy Med Name: PARoxetine HCl 20 MG Oral Tablet] 180 tablet 2    Sig: TAKE 1 TABLET BY MOUTH  TWICE DAILY     Psychiatry:  Antidepressants - SSRI Passed - 06/06/2022  4:31 PM      Passed - Completed PHQ-2 or PHQ-9 in the last 360 days      Passed - Valid encounter within last 6 months    Recent Outpatient Visits          3 months ago Atrial fibrillation, unspecified type ([place])   [place] Susy Frizzle, MD   4 months ago Persistent atrial fibrillation ([place])   [place] Susy Frizzle, MD   5 months ago Atrial fibrillation, unspecified type [facility])   [place] Pickard, Cammie Mcgee, MD   3 years ago Bloating   [place] Dennard Schaumann, Cammie Mcgee, MD   4 years ago Benign essential HTN   [place] Pickard, Cammie Mcgee, MD

## 2022-06-19 ENCOUNTER — Ambulatory Visit (INDEPENDENT_AMBULATORY_CARE_PROVIDER_SITE_OTHER): Payer: Medicare Other | Admitting: *Deleted

## 2022-06-19 DIAGNOSIS — I5021 Acute systolic (congestive) heart failure: Secondary | ICD-10-CM

## 2022-06-19 DIAGNOSIS — I10 Essential (primary) hypertension: Secondary | ICD-10-CM

## 2022-06-19 DIAGNOSIS — I4819 Other persistent atrial fibrillation: Secondary | ICD-10-CM

## 2022-06-19 NOTE — Chronic Care Management (AMB) (Signed)
Chronic Care Management   CCM RN Visit Note  06/19/2022 Name: Kristina Dougherty MRN: [id number] DOB: 05/28/1946  Subjective: Kristina Dougherty is a 76 y.o. year old female who is a primary care patient of Pickard, Cammie Mcgee, MD. The care management team was consulted for assistance with disease management and care coordination needs.    Engaged with patient by telephone for follow up visit in response to provider referral for case management and/or care coordination services.   Consent to Services:  The patient was given information about Chronic Care Management services, agreed to services, and gave verbal consent prior to initiation of services.  Please see initial visit note for detailed documentation.   Patient agreed to services and verbal consent obtained.   Assessment: Review of patient past medical history, allergies, medications, health status, including review of consultants reports, laboratory and other test data, was performed as part of comprehensive evaluation and provision of chronic care management services.   SDOH (Social Determinants of Health) assessments and interventions performed:    CCM Care Plan  Allergies  Allergen Reactions   Morphine And Related     Severe HA   Nitrofurantoin Nausea And Vomiting    Outpatient Encounter Medications as of 06/19/2022  Medication Sig   acetaminophen (TYLENOL) 500 MG tablet Take 500 mg by mouth every 6 (six) hours as needed (pain.).   ALPRAZolam (XANAX) 0.5 MG tablet TAKE 1 TABLET BY MOUTH THREE TIMES DAILY AS NEEDED FOR ANXIETY   amiodarone (PACERONE) 200 MG tablet Take 1 tablet (200 mg total) by mouth daily.   clobetasol cream (TEMOVATE) 9.56 % APPLY ONE APPLICATION TOPICALLY TWO TIMES DAILY (Patient taking differently: Apply 1 application  topically 2 (two) times daily as needed (irritation).)   furosemide (LASIX) 40 MG tablet Take 1 tablet (40 mg total) by mouth daily.   losartan (COZAAR) 50 MG tablet TAKE 1 TABLET BY MOUTH  ONCE DAILY   metoprolol tartrate (LOPRESSOR) 25 MG tablet Take 0.5 tablets (12.5 mg total) by mouth 2 (two) times daily.   ondansetron (ZOFRAN) 4 MG tablet Take 1 tablet (4 mg total) by mouth every 6 (six) hours as needed for nausea.   pantoprazole (PROTONIX) 40 MG tablet TAKE 1 TABLET BY MOUTH  DAILY   PARoxetine (PAXIL) 20 MG tablet TAKE 1 TABLET BY MOUTH  TWICE DAILY   polyethylene glycol (MIRALAX / GLYCOLAX) 17 g packet Take 17 g by mouth daily. (Patient taking differently: Take 17 g by mouth daily as needed for moderate constipation.)   potassium chloride SA (KLOR-CON M) 20 MEQ tablet Take 1 tablet (20 mEq total) by mouth daily.   apixaban (ELIQUIS) 5 MG TABS tablet Take 1 tablet (5 mg total) by mouth 2 (two) times daily.   No facility-administered encounter medications on file as of 06/19/2022.    Patient Active Problem List   Diagnosis Date Noted   Secondary hypercoagulable state ([place]) 04/26/2022   PAF (paroxysmal atrial fibrillation) (HCC)    Thrombocytopenia (HCC) 01/21/2022   Pleural effusion due to CHF (congestive heart failure) ([place]) 01/19/2022   Obesity (BMI 30-39.9) 01/18/2022   Pericardial effusion 21/30/8657   Acute systolic CHF (congestive heart failure) ([place]) 01/17/2022   AKI (acute kidney injury) ([place]) 01/17/2022   Hypomagnesemia 01/17/2022   Abnormal thyroid function test 01/17/2022   Constipation 01/17/2022   GERD (gastroesophageal reflux disease) 01/17/2022   Persistent atrial fibrillation (HCC)    Bleeding per rectum 12/15/2021   Change in bowel habit 12/15/2021   Diarrhea  12/15/2021   First degree hemorrhoids 12/15/2021   Flatulence, eructation and gas pain 12/15/2021   Irritable bowel syndrome with diarrhea 12/15/2021   Hypokalemia 12/05/2021   Atrial fibrillation with rapid ventricular response ([place]) 12/04/2021   Physical exam, annual 08/14/2012   C. difficile colitis 26/94/8546   Lichen sclerosus et atrophicus 04/23/2012   HYPERTENSION, MILD 11/25/2009    ARTHRITIS, KNEE 10/07/2009   ACUTE BRONCHITIS 09/21/2009   ARTHRITIS, KNEES, BILATERAL 09/21/2009   UTI 02/18/2009   EDEMA 02/18/2009   BURSITIS, RIGHT SHOULDER 11/04/2008   TOBACCO USE, QUIT 11/04/2008   Essential hypertension 11/14/2007   ANXIETY DEPRESSION 11/06/2007   SCIATICA 11/06/2007    Conditions to be addressed/monitored:Atrial Fibrillation, CHF, and HTN  Care Plan : RN Care Manager Plan of Care  Updates made by Kassie Mends, RN since 06/19/2022 12:00 AM  Completed 06/19/2022   Problem: No plan of care established for management of chronic disease state  (Atrial Fibrillation, CHF, HTN) Resolved 06/19/2022  Priority: High     Long-Range Goal: Development of plan of care for chronic disease management  (Atrial Fibrillation, CHF, HTN) Completed 06/19/2022  Start Date: 01/27/2022  Expected End Date: 10/28/2022  Priority: High  Note:   Current Barriers:  Knowledge Deficits related to plan of care for management of Atrial Fibrillation, CHF, and HTN  No Advanced Directives in place- pt declines information Patient reports she lives alone, has friend that lives nearby that transports her to appointments and to pickup medications and groceries, pt reports she has a car and is now driving again, feels she is getting stronger. Pt reports she has blood pressure cuff but does not check and will ask her friend to assist to check blood pressure on occasion, update- still not checking blood pressure, has scale and does weigh daily with today's weight 161 pounds. Pt hospitalized 01/17/22 diagnosis atrial fibrillation, CHF exacerbation with pleural effusion left side. Pt reports she has no needs for social worker or pharmacist at this time.  Pt reports she has all medications and taking as prescribed, states in the future she may switch to a pharmacy that delivers but states " I'm not gonna do this right now"  Update- patient reports she had cardioversion recently and "it didn't last so I'm  seeing a cardiologist on 06/05/22 for evaluation for ablation"  pt reports she has not been having symptoms recently (only on occasion) and does not feel nearly as bad as she did before having cardioversion. Update 06/19/22- patient reports she did follow up with cardiologist on 06/05/22 and discussed ablation and states it was explained in detail but cannot have procedure done until 09/28/22 due to long wait list.  RNCM Clinical Goal(s):  Patient will verbalize understanding of plan for management of Atrial Fibrillation, CHF, and HTN as evidenced by patient report, review of EHR and  through collaboration with RN Care manager, provider, and care team.   Interventions: 1:1 collaboration with primary care provider regarding development and update of comprehensive plan of care as evidenced by provider attestation and co-signature Inter-disciplinary care team collaboration (see longitudinal plan of care) Evaluation of current treatment plan related to  self management and patient's adherence to plan as established by provider   AFIB Interventions: (Status:  New goal. and Goal on track:  Yes.) Long Term Goal   Counseled on increased risk of stroke due to Afib and benefits of anticoagulation for stroke prevention Reviewed importance of adherence to anticoagulant exactly as prescribed Counseled on importance of regular  laboratory monitoring as prescribed Afib action plan reviewed Reviewed importance of doing some type of exercise, walking is good and getting outside daily weather permitting Reinforced importance of checking pulse daily Reviewed upcoming scheduled appointments Reviewed plan of care with patient including case closure   Heart Failure Interventions:  (Status:  New goal. and Goal on track:  Yes.) Long Term Goal Provided education on low sodium diet Discussed importance of daily weight and advised patient to weigh and record daily Discussed the importance of keeping all appointments with  provider Provided patient with education about the role of exercise in the management of heart failure Reinforced Heart Failure action plan Reviewed weight log with patient  Hypertension Interventions:  (Status:  New goal. and Goal on track:  Yes.) Long Term Goal Last practice recorded BP readings:  BP Readings from Last 3 Encounters:  01/22/22 121/69  01/17/22 (!) 124/98  01/12/22 94/68  Most recent eGFR/CrCl:  Lab Results  Component Value Date   EGFR 72 12/16/2021    No components found for: CRCL  Evaluation of current treatment plan related to hypertension self management and patient's adherence to plan as established by provider Reviewed medications with patient and discussed importance of compliance Discussed complications of poorly controlled blood pressure such as heart disease, stroke, circulatory complications, vision complications, kidney impairment, sexual dysfunction Reviewed importance of following low sodium diet   Patient Goals/Self-Care Activities: Take medications as prescribed   Attend all scheduled provider appointments Call pharmacy for medication refills 3-7 days in advance of running out of medications Attend church or other social activities Perform all self care activities independently  Perform IADL's (shopping, preparing meals, housekeeping, managing finances) independently Call provider office for new concerns or questions  call office if I gain more than 2 pounds in one day or 5 pounds in one week keep legs up while sitting track weight in diary use salt in moderation watch for swelling in feet, ankles and legs every day weigh myself daily follow rescue plan if symptoms flare-up eat more whole grains, fruits and vegetables, lean meats and healthy fats track symptoms and what helps feel better or worse dress right for the weather, hot or cold - begin a symptom diary - bring symptom diary to all appointments - check pulse (heart) rate once a day -  make a plan to exercise regularly - make a plan to eat healthy - keep all lab appointments - take medicine as prescribed check blood pressure weekly choose a place to take my blood pressure (home, clinic or office, retail store) write blood pressure results in a log or diary take blood pressure log to all doctor appointments take medications for blood pressure exactly as prescribed Continue to follow Heart Failure and Atrial Fibrillation action plan, call your doctor early on for change in health status, symptoms Check your pulse daily Case closure today, please talk with your doctor if you have any future case management needs       Plan:No further follow up required: case closure  Jacqlyn Larsen [facility], BSN RN Case Manager [facility] [phone]

## 2022-06-19 NOTE — Patient Instructions (Signed)
Visit Information  Thank you for taking time to visit with me today. Please don't hesitate to contact me if I can be of assistance to you before our next scheduled telephone appointment.  Following are the goals we discussed today:  Take medications as prescribed   Attend all scheduled provider appointments Call pharmacy for medication refills 3-7 days in advance of running out of medications Attend church or other social activities Perform all self care activities independently  Perform IADL's (shopping, preparing meals, housekeeping, managing finances) independently Call provider office for new concerns or questions  call office if I gain more than 2 pounds in one day or 5 pounds in one week keep legs up while sitting track weight in diary use salt in moderation watch for swelling in feet, ankles and legs every day weigh myself daily follow rescue plan if symptoms flare-up eat more whole grains, fruits and vegetables, lean meats and healthy fats track symptoms and what helps feel better or worse dress right for the weather, hot or cold - begin a symptom diary - bring symptom diary to all appointments - check pulse (heart) rate once a day - make a plan to exercise regularly - make a plan to eat healthy - keep all lab appointments - take medicine as prescribed check blood pressure weekly choose a place to take my blood pressure (home, clinic or office, retail store) write blood pressure results in a log or diary take blood pressure log to all doctor appointments take medications for blood pressure exactly as prescribed Continue to follow Heart Failure and Atrial Fibrillation action plan, call your doctor early on for change in health status, symptoms Check your pulse daily Case closure today, please talk with your doctor if you have any future case management needs   Please call the care guide team at [phone] if you need to cancel or reschedule your appointment.   If you  are experiencing a Mental Health or [place] or need someone to talk to, please call the Suicide and Crisis Lifeline: 988 call the [place] National Suicide Prevention Lifeline: (440) 759-5506 or TTY: 782-260-6552 TTY 514 746 6005) to talk to a trained counselor call 1-800-273-TALK (toll free, 24 hour hotline) go to [facility] [address] [place] [phone]) call 911   The patient verbalized understanding of instructions, educational materials, and care plan provided today and DECLINED offer to receive copy of patient instructions, educational materials, and care plan.   Jacqlyn Larsen RNC, BSN RN Case Manager [facility] [phone]

## 2022-06-26 ENCOUNTER — Telehealth: Payer: Self-pay

## 2022-06-26 DIAGNOSIS — I10 Essential (primary) hypertension: Secondary | ICD-10-CM

## 2022-06-26 DIAGNOSIS — I4819 Other persistent atrial fibrillation: Secondary | ICD-10-CM

## 2022-06-26 DIAGNOSIS — I5021 Acute systolic (congestive) heart failure: Secondary | ICD-10-CM | POA: Diagnosis not present

## 2022-07-12 ENCOUNTER — Other Ambulatory Visit: Payer: Self-pay

## 2022-07-12 MED ORDER — APIXABAN 5 MG PO TABS: 5.0000 mg | ORAL_TABLET | Freq: Two times a day (BID) | ORAL | 2 refills | Status: DC

## 2022-08-10 ENCOUNTER — Other Ambulatory Visit (HOSPITAL_COMMUNITY): Payer: Self-pay | Admitting: Nurse Practitioner

## 2022-08-29 ENCOUNTER — Other Ambulatory Visit (HOSPITAL_COMMUNITY): Payer: Self-pay | Admitting: Nurse Practitioner

## 2022-09-07 ENCOUNTER — Ambulatory Visit: Payer: Medicare Other | Attending: Cardiology

## 2022-09-07 DIAGNOSIS — I480 Paroxysmal atrial fibrillation: Secondary | ICD-10-CM

## 2022-09-07 DIAGNOSIS — Z01812 Encounter for preprocedural laboratory examination: Secondary | ICD-10-CM

## 2022-09-07 DIAGNOSIS — I4819 Other persistent atrial fibrillation: Secondary | ICD-10-CM

## 2022-09-08 ENCOUNTER — Telehealth: Payer: Self-pay | Admitting: Family Medicine

## 2022-09-08 LAB — CBC
Hematocrit: 45.1 % (ref 34.0–46.6)
Hemoglobin: 15 g/dL (ref 11.1–15.9)
MCH: 28.6 pg (ref 26.6–33.0)
MCHC: 33.3 g/dL (ref 31.5–35.7)
MCV: 86 fL (ref 79–97)
Platelets: 226 10*3/uL (ref 150–450)
RBC: 5.24 x10E6/uL (ref 3.77–5.28)
RDW: 12.8 % (ref 11.7–15.4)
WBC: 7.3 10*3/uL (ref 3.4–10.8)

## 2022-09-08 LAB — BASIC METABOLIC PANEL
BUN/Creatinine Ratio: 20 (ref 12–28)
BUN: 24 mg/dL (ref 8–27)
CO2: 28 mmol/L (ref 20–29)
Calcium: 10 mg/dL (ref 8.7–10.3)
Chloride: 101 mmol/L (ref 96–106)
Creatinine, Ser: 1.19 mg/dL — ABNORMAL HIGH (ref 0.57–1.00)
Glucose: 88 mg/dL (ref 70–99)
Potassium: 4.8 mmol/L (ref 3.5–5.2)
Sodium: 143 mmol/L (ref 134–144)
eGFR: 47 mL/min/{1.73_m2} — ABNORMAL LOW (ref >59)

## 2022-09-08 NOTE — Telephone Encounter (Signed)
Received three way call from [place] at [facility] with patient on the other line. Stated patient's last refill for Eliquis was short 9 pills; patient won't have enough pills to get through the weekend.  Letta Moynahan unable to override current script in their system, and patient not due for next refill until 10/19.  Patient will come to office to get samples.   Nothing further needed at this time.

## 2022-09-20 ENCOUNTER — Telehealth (HOSPITAL_COMMUNITY): Payer: Self-pay | Admitting: Emergency Medicine

## 2022-09-20 NOTE — Telephone Encounter (Signed)
Attempted to call patient regarding upcoming cardiac CT appointment. °Left message on voicemail with name and callback number °Aradhana Gin RN Navigator Cardiac Imaging °[facility] °[phone] Office °[phone] Cell ° °

## 2022-09-21 ENCOUNTER — Other Ambulatory Visit (HOSPITAL_COMMUNITY): Payer: Self-pay | Admitting: Nurse Practitioner

## 2022-09-21 ENCOUNTER — Other Ambulatory Visit (HOSPITAL_COMMUNITY): Payer: Self-pay

## 2022-09-21 ENCOUNTER — Ambulatory Visit (HOSPITAL_BASED_OUTPATIENT_CLINIC_OR_DEPARTMENT_OTHER)
Admission: RE | Admit: 2022-09-21 | Discharge: 2022-09-21 | Disposition: A | Discharge status: Home / Self Care | Payer: Medicare Other | Source: Ambulatory Visit | Attending: Cardiology | Admitting: Cardiology

## 2022-09-21 DIAGNOSIS — I480 Paroxysmal atrial fibrillation: Secondary | ICD-10-CM | POA: Diagnosis not present

## 2022-09-21 MED ORDER — FUROSEMIDE 40 MG PO TABS: 40.0000 mg | ORAL_TABLET | Freq: Every day | ORAL | 1 refills | Status: DC

## 2022-09-21 MED ORDER — IOHEXOL 350 MG/ML SOLN
100.0000 mL | Freq: Once | INTRAVENOUS | Status: AC | PRN
  Administered 2022-09-21: 75 mL via INTRAVENOUS

## 2022-09-27 NOTE — Pre-Procedure Instructions (Signed)
Instructed patient on the following items: Arrival time 1100 Nothing to eat or drink after midnight No meds AM of procedure Responsible person to drive you home and stay with you for 24 hrs  Have you missed any doses of anti-coagulant Eliquis- hasn't missed any doses    

## 2022-09-28 ENCOUNTER — Ambulatory Visit (HOSPITAL_BASED_OUTPATIENT_CLINIC_OR_DEPARTMENT_OTHER): Payer: Medicare Other | Admitting: Certified Registered"

## 2022-09-28 ENCOUNTER — Encounter (HOSPITAL_COMMUNITY): Payer: Self-pay | Admitting: Cardiology

## 2022-09-28 ENCOUNTER — Ambulatory Visit (HOSPITAL_COMMUNITY)
Admission: RE | Admit: 2022-09-28 | Discharge: 2022-09-29 | Disposition: A | Discharge status: Home / Self Care | Payer: Medicare Other | Attending: Cardiology | Admitting: Cardiology

## 2022-09-28 ENCOUNTER — Ambulatory Visit (HOSPITAL_COMMUNITY): Payer: Medicare Other | Admitting: Certified Registered"

## 2022-09-28 ENCOUNTER — Other Ambulatory Visit: Payer: Self-pay

## 2022-09-28 ENCOUNTER — Encounter (HOSPITAL_COMMUNITY)
Admission: RE | Disposition: A | Discharge status: Home / Self Care | Payer: Self-pay | Source: Home / Self Care | Attending: Cardiology

## 2022-09-28 DIAGNOSIS — I10 Essential (primary) hypertension: Secondary | ICD-10-CM | POA: Insufficient documentation

## 2022-09-28 DIAGNOSIS — Z79899 Other long term (current) drug therapy: Secondary | ICD-10-CM | POA: Insufficient documentation

## 2022-09-28 DIAGNOSIS — I4891 Unspecified atrial fibrillation: Secondary | ICD-10-CM | POA: Diagnosis not present

## 2022-09-28 DIAGNOSIS — Z87891 Personal history of nicotine dependence: Secondary | ICD-10-CM

## 2022-09-28 DIAGNOSIS — F419 Anxiety disorder, unspecified: Secondary | ICD-10-CM | POA: Diagnosis not present

## 2022-09-28 DIAGNOSIS — F32A Depression, unspecified: Secondary | ICD-10-CM | POA: Diagnosis not present

## 2022-09-28 DIAGNOSIS — F418 Other specified anxiety disorders: Secondary | ICD-10-CM | POA: Diagnosis not present

## 2022-09-28 DIAGNOSIS — I4819 Other persistent atrial fibrillation: Secondary | ICD-10-CM | POA: Diagnosis not present

## 2022-09-28 DIAGNOSIS — I959 Hypotension, unspecified: Secondary | ICD-10-CM | POA: Insufficient documentation

## 2022-09-28 HISTORY — PX: ATRIAL FIBRILLATION ABLATION: EP1191

## 2022-09-28 LAB — POCT ACTIVATED CLOTTING TIME: Activated Clotting Time: 371 seconds

## 2022-09-28 SURGERY — ATRIAL FIBRILLATION ABLATION
Anesthesia: General

## 2022-09-28 MED ORDER — HEPARIN SODIUM (PORCINE) 1000 UNIT/ML IJ SOLN
INTRAMUSCULAR | Status: DC | PRN
  Administered 2022-09-28: 1000 [IU] via INTRAVENOUS

## 2022-09-28 MED ORDER — HEPARIN (PORCINE) IN NACL 1000-0.9 UT/500ML-% IV SOLN
INTRAVENOUS | Status: AC
  Filled 2022-09-28: qty 500

## 2022-09-28 MED ORDER — ACETAMINOPHEN 325 MG PO TABS: 650.0000 mg | ORAL_TABLET | ORAL | Status: DC | PRN

## 2022-09-28 MED ORDER — HEPARIN SODIUM (PORCINE) 1000 UNIT/ML IJ SOLN
INTRAMUSCULAR | Status: DC | PRN
  Administered 2022-09-28: 13000 [IU] via INTRAVENOUS

## 2022-09-28 MED ORDER — FENTANYL CITRATE (PF) 100 MCG/2ML IJ SOLN
INTRAMUSCULAR | Status: DC | PRN
  Administered 2022-09-28 (×2): 50 ug via INTRAVENOUS

## 2022-09-28 MED ORDER — SODIUM CHLORIDE 0.9 % IV SOLN: 250.0000 mL | INTRAVENOUS | Status: DC | PRN

## 2022-09-28 MED ORDER — SODIUM CHLORIDE 0.9 % IV SOLN: INTRAVENOUS | Status: DC

## 2022-09-28 MED ORDER — ONDANSETRON HCL 4 MG PO TABS: 4.0000 mg | ORAL_TABLET | Freq: Four times a day (QID) | ORAL | Status: DC | PRN

## 2022-09-28 MED ORDER — ACETAMINOPHEN 500 MG PO TABS
1000.0000 mg | ORAL_TABLET | Freq: Once | ORAL | Status: AC
  Administered 2022-09-28: 1000 mg via ORAL

## 2022-09-28 MED ORDER — PANTOPRAZOLE SODIUM 40 MG PO TBEC
40.0000 mg | DELAYED_RELEASE_TABLET | Freq: Every day | ORAL | Status: DC
  Administered 2022-09-29: 40 mg via ORAL
  Filled 2022-09-28: qty 1

## 2022-09-28 MED ORDER — SUGAMMADEX SODIUM 200 MG/2ML IV SOLN
INTRAVENOUS | Status: DC | PRN
  Administered 2022-09-28: 200 mg via INTRAVENOUS

## 2022-09-28 MED ORDER — ONDANSETRON HCL 4 MG/2ML IJ SOLN: 4.0000 mg | Freq: Four times a day (QID) | INTRAMUSCULAR | Status: DC | PRN

## 2022-09-28 MED ORDER — PHENYLEPHRINE 80 MCG/ML (10ML) SYRINGE FOR IV PUSH (FOR BLOOD PRESSURE SUPPORT)
120.0000 ug | PREFILLED_SYRINGE | Freq: Once | INTRAVENOUS | Status: AC | PRN
  Administered 2022-09-28: 120 ug via INTRAVENOUS

## 2022-09-28 MED ORDER — AMIODARONE HCL 200 MG PO TABS
200.0000 mg | ORAL_TABLET | Freq: Every day | ORAL | Status: DC
  Administered 2022-09-29: 200 mg via ORAL
  Filled 2022-09-28: qty 1

## 2022-09-28 MED ORDER — PHENYLEPHRINE 80 MCG/ML (10ML) SYRINGE FOR IV PUSH (FOR BLOOD PRESSURE SUPPORT)
PREFILLED_SYRINGE | INTRAVENOUS | Status: DC | PRN
  Administered 2022-09-28 (×3): 80 ug via INTRAVENOUS
  Administered 2022-09-28 (×2): 160 ug via INTRAVENOUS
  Administered 2022-09-28 (×2): 80 ug via INTRAVENOUS

## 2022-09-28 MED ORDER — LIDOCAINE 2% (20 MG/ML) 5 ML SYRINGE
INTRAMUSCULAR | Status: DC | PRN
  Administered 2022-09-28: 20 mg via INTRAVENOUS

## 2022-09-28 MED ORDER — APIXABAN 5 MG PO TABS
5.0000 mg | ORAL_TABLET | Freq: Two times a day (BID) | ORAL | Status: DC
  Administered 2022-09-28 – 2022-09-29 (×2): 5 mg via ORAL
  Filled 2022-09-28 (×2): qty 1

## 2022-09-28 MED ORDER — HEPARIN (PORCINE) IN NACL 1000-0.9 UT/500ML-% IV SOLN
INTRAVENOUS | Status: DC | PRN
  Administered 2022-09-28 (×4): 500 mL

## 2022-09-28 MED ORDER — DOBUTAMINE INFUSION FOR EP/ECHO/NUC (1000 MCG/ML)
INTRAVENOUS | Status: DC | PRN
  Administered 2022-09-28: 20 ug/kg/min via INTRAVENOUS

## 2022-09-28 MED ORDER — DEXAMETHASONE SODIUM PHOSPHATE 10 MG/ML IJ SOLN
INTRAMUSCULAR | Status: DC | PRN
  Administered 2022-09-28: 5 mg via INTRAVENOUS

## 2022-09-28 MED ORDER — ACETAMINOPHEN 500 MG PO TABS
ORAL_TABLET | ORAL | Status: AC
  Filled 2022-09-28: qty 2

## 2022-09-28 MED ORDER — PROPOFOL 10 MG/ML IV BOLUS
INTRAVENOUS | Status: DC | PRN
  Administered 2022-09-28: 120 mg via INTRAVENOUS

## 2022-09-28 MED ORDER — ACETAMINOPHEN 500 MG PO TABS: 500.0000 mg | ORAL_TABLET | Freq: Four times a day (QID) | ORAL | Status: DC | PRN

## 2022-09-28 MED ORDER — EPHEDRINE SULFATE-NACL 50-0.9 MG/10ML-% IV SOSY
PREFILLED_SYRINGE | INTRAVENOUS | Status: DC | PRN
  Administered 2022-09-28: 10 mg via INTRAVENOUS
  Administered 2022-09-28: 5 mg via INTRAVENOUS
  Administered 2022-09-28: 10 mg via INTRAVENOUS
  Administered 2022-09-28 (×2): 5 mg via INTRAVENOUS

## 2022-09-28 MED ORDER — SODIUM CHLORIDE 0.9% FLUSH
3.0000 mL | Freq: Two times a day (BID) | INTRAVENOUS | Status: DC
  Administered 2022-09-28: 3 mL via INTRAVENOUS

## 2022-09-28 MED ORDER — POTASSIUM CHLORIDE CRYS ER 20 MEQ PO TBCR
20.0000 meq | EXTENDED_RELEASE_TABLET | Freq: Every day | ORAL | Status: DC
  Administered 2022-09-29: 20 meq via ORAL
  Filled 2022-09-28: qty 1

## 2022-09-28 MED ORDER — ALBUMIN HUMAN 5 % IV SOLN
12.5000 g | Freq: Once | INTRAVENOUS | Status: AC
  Administered 2022-09-28: 12.5 g via INTRAVENOUS

## 2022-09-28 MED ORDER — HEPARIN SODIUM (PORCINE) 1000 UNIT/ML IJ SOLN
INTRAMUSCULAR | Status: AC
  Filled 2022-09-28: qty 10

## 2022-09-28 MED ORDER — PHENYLEPHRINE HCL-NACL 20-0.9 MG/250ML-% IV SOLN
INTRAVENOUS | Status: DC | PRN
  Administered 2022-09-28: 30 ug/min via INTRAVENOUS

## 2022-09-28 MED ORDER — FUROSEMIDE 40 MG PO TABS
40.0000 mg | ORAL_TABLET | Freq: Every day | ORAL | Status: DC
  Administered 2022-09-29: 40 mg via ORAL
  Filled 2022-09-28: qty 1

## 2022-09-28 MED ORDER — ALPRAZOLAM 0.25 MG PO TABS
0.5000 mg | ORAL_TABLET | Freq: Three times a day (TID) | ORAL | Status: DC | PRN
  Administered 2022-09-28: 0.5 mg via ORAL
  Filled 2022-09-28: qty 2

## 2022-09-28 MED ORDER — ROCURONIUM BROMIDE 100 MG/10ML IV SOLN
INTRAVENOUS | Status: DC | PRN
  Administered 2022-09-28: 20 mg via INTRAVENOUS
  Administered 2022-09-28: 10 mg via INTRAVENOUS
  Administered 2022-09-28: 60 mg via INTRAVENOUS

## 2022-09-28 MED ORDER — SODIUM CHLORIDE 0.9% FLUSH: 3.0000 mL | INTRAVENOUS | Status: DC | PRN

## 2022-09-28 MED ORDER — PAROXETINE HCL 20 MG PO TABS
20.0000 mg | ORAL_TABLET | Freq: Two times a day (BID) | ORAL | Status: DC
  Administered 2022-09-28 – 2022-09-29 (×2): 20 mg via ORAL
  Filled 2022-09-28 (×2): qty 1

## 2022-09-28 MED ORDER — ONDANSETRON HCL 4 MG/2ML IJ SOLN
INTRAMUSCULAR | Status: DC | PRN
  Administered 2022-09-28: 4 mg via INTRAVENOUS

## 2022-09-28 MED ORDER — PROTAMINE SULFATE 10 MG/ML IV SOLN
INTRAVENOUS | Status: DC | PRN
  Administered 2022-09-28: 40 mg via INTRAVENOUS

## 2022-09-28 MED ORDER — DOBUTAMINE INFUSION FOR EP/ECHO/NUC (1000 MCG/ML)
INTRAVENOUS | Status: AC
  Filled 2022-09-28: qty 250

## 2022-09-28 SURGICAL SUPPLY — 19 items
CATH 8FR REPROCESSED SOUNDSTAR (CATHETERS) ×1 IMPLANT
CATH 8FR SOUNDSTAR REPROCESSED (CATHETERS) IMPLANT
CATH ABLAT QDOT MICRO BI TC DF (CATHETERS) IMPLANT
CATH OCTARAY 2.0 F 3-3-3-3-3 (CATHETERS) IMPLANT
CATH PIGTAIL STEERABLE D1 8.7 (WIRE) IMPLANT
CATH S-M CIRCA TEMP PROBE (CATHETERS) IMPLANT
CATH WEBSTER BI DIR CS D-F CRV (CATHETERS) IMPLANT
CLOSURE PERCLOSE PROSTYLE (VASCULAR PRODUCTS) IMPLANT
COVER SWIFTLINK CONNECTOR (BAG) ×1 IMPLANT
PACK EP LATEX FREE (CUSTOM PROCEDURE TRAY) ×1
PACK EP LF (CUSTOM PROCEDURE TRAY) ×1 IMPLANT
PAD DEFIB RADIO PHYSIO CONN (PAD) ×1 IMPLANT
PATCH CARTO3 (PAD) IMPLANT
SHEATH CARTO VIZIGO SM CVD (SHEATH) IMPLANT
SHEATH PINNACLE 7F 10CM (SHEATH) IMPLANT
SHEATH PINNACLE 8F 10CM (SHEATH) IMPLANT
SHEATH PINNACLE 9F 10CM (SHEATH) IMPLANT
SHEATH PROBE COVER 6X72 (BAG) IMPLANT
TUBING SMART ABLATE COOLFLOW (TUBING) IMPLANT

## 2022-09-28 NOTE — Discharge Instructions (Addendum)

## 2022-09-28 NOTE — Transfer of Care (Signed)
Immediate Anesthesia Transfer of Care Note  Patient: Kristina Dougherty  Procedure(s) Performed: ATRIAL FIBRILLATION ABLATION  Patient Location: PACU  Anesthesia Type:General  Level of Consciousness: awake and patient cooperative  Airway & Oxygen Therapy: Patient Spontanous Breathing and Patient connected to nasal cannula oxygen  Post-op Assessment: Report given to RN and Post -op Vital signs reviewed and stable  Post vital signs: Reviewed and stable  Last Vitals:  Vitals Value Taken Time  BP 115/97 09/28/22 1357  Temp 36.6 C 09/28/22 1356  Pulse 56 09/28/22 1359  Resp 13 09/28/22 1359  SpO2 96 % 09/28/22 1359  Vitals shown include unvalidated device data.  Last Pain:  Vitals:   09/28/22 1356  TempSrc: Temporal  PainSc: 0-No pain         Complications: There were no known notable events for this encounter.

## 2022-09-28 NOTE — Progress Notes (Signed)
Called on call cardiology regarding pt hypotension. Last BP measured 81/57. I told him I had not yet stopped her expired order of 20m/hr of post-procedure fluids. Pt is asymptomatic. Dr. NMarcelle Smilinggave verbal order to continue fluids at this rate and watch BP, notify him if it continues to drop.

## 2022-09-28 NOTE — Anesthesia Preprocedure Evaluation (Addendum)
Anesthesia Evaluation  Patient identified by MRN, date of birth, ID band Patient awake    Reviewed: Allergy & Precautions, NPO status , Patient's Chart, lab work & pertinent test results, reviewed documented beta blocker date and time   History of Anesthesia Complications Negative for: history of anesthetic complications  Airway Mallampati: I  TM Distance: >3 FB Neck ROM: Full    Dental  (+) Poor Dentition, Chipped, Missing, Dental Advisory Given   Pulmonary former smoker   breath sounds clear to auscultation       Cardiovascular hypertension, Pt. on medications and Pt. on home beta blockers (-) angina + dysrhythmias Atrial Fibrillation  Rhythm:Irregular Rate:Normal     Neuro/Psych   Anxiety Depression    negative neurological ROS     GI/Hepatic Neg liver ROS,GERD  Medicated and Controlled,,  Endo/Other  negative endocrine ROS    Renal/GU Renal InsufficiencyRenal disease     Musculoskeletal  (+) Arthritis ,    Abdominal   Peds  Hematology eliquis   Anesthesia Other Findings   Reproductive/Obstetrics                             Anesthesia Physical Anesthesia Plan  ASA: 3  Anesthesia Plan: General   Post-op Pain Management: Tylenol PO (pre-op)*   Induction: Intravenous  PONV Risk Score and Plan: 3 and Ondansetron, Dexamethasone and Treatment may vary due to age or medical condition  Airway Management Planned: Oral ETT  Additional Equipment: None  Intra-op Plan:   Post-operative Plan: Extubation in OR  Informed Consent: I have reviewed the patients History and Physical, chart, labs and discussed the procedure including the risks, benefits and alternatives for the proposed anesthesia with the patient or authorized representative who has indicated his/her understanding and acceptance.     Dental advisory given  Plan Discussed with: CRNA and Surgeon  Anesthesia Plan Comments:         Anesthesia Quick Evaluation

## 2022-09-28 NOTE — Anesthesia Postprocedure Evaluation (Signed)
Anesthesia Post Note  Patient: Kristina Dougherty  Procedure(s) Performed: ATRIAL FIBRILLATION ABLATION     Patient location during evaluation: Cath Lab Anesthesia Type: General Level of consciousness: awake and alert, patient cooperative and oriented Pain management: pain level controlled Vital Signs Assessment: post-procedure vital signs reviewed and stable Respiratory status: spontaneous breathing, nonlabored ventilation and respiratory function stable Cardiovascular status: blood pressure returned to baseline and stable Postop Assessment: no apparent nausea or vomiting Anesthetic complications: no   There were no known notable events for this encounter.  Last Vitals:  Vitals:   09/28/22 1410 09/28/22 1420  BP: (!) 87/47 (!) 85/36  Pulse: (!) 55 (!) 55  Resp: 12 14  Temp:    SpO2: 97% 92%    Last Pain:  Vitals:   09/28/22 1356  TempSrc: Temporal  PainSc: 0-No pain                 Doneta Bayman,E. Ian Cavey

## 2022-09-28 NOTE — Discharge Summary (Addendum)
ELECTROPHYSIOLOGY PROCEDURE DISCHARGE SUMMARY    Patient ID: Kristina Dougherty,  MRN: [id number], DOB/AGE: [birth year] 76 y.o.  Admit date: 09/28/2022 Discharge date: 09/29/2022   Primary Care Physician: Susy Frizzle, MD  Primary Cardiologist: None  Electrophysiologist: Dr. Curt Bears  Primary Discharge Diagnosis:  Atrial Fibrillation  Secondary Discharge Diagnosis:  Hypotension  Procedures This Admission:  1.  Electrophysiology study and radiofrequency catheter ablation of Atrial Fibrillation on 09/28/2022 by Dr. Curt Bears.  This study demonstrated; i. Atrial fibrillation upon presentation.   ii. Successful electrical isolation and anatomical encircling of all four pulmonary veins with radiofrequency current.  A WACA approach was used iii. Additional left atrial ablation was performed with a standard box lesion created along the posterior wall of the left atrium iv. Atrial fibrillation successfully cardioverted to sinus rhythm. v. No early apparent complications.        Brief HPI: Kristina Dougherty is a 76 y.o. female with a history of Atrial Fibrillation.  They have failed medical therapy with amiodarone. Risks, benefits, and alternatives to catheter ablation of Atrial Fibrillation were reviewed with the patient who wished to proceed.   The patient has been on uninterrupted anticoagulation for more than 3 weeks and did not require TEE.  Hospital Course:  The patient was admitted and underwent EPS/RFCA of Atrial Fibrillation with details as outlined above. She had hypotension after the case, prompting observation overnight. They were monitored on telemetry overnight which demonstrated NSR.  Groin was without complication on the day of discharge. BP improved overnight, as high as 361 systolic. The patient was examined and considered to be stable for discharge.  Wound care and restrictions were reviewed with the patient.  The patient Kristina Dougherty be seen back by Kristina Peals, PA in 4 weeks  and Dr. Curt Bears in 12 weeks for post ablation follow up.   CHA2DS2VASC is at least 5.  Physical Exam: Vitals:   09/29/22 0243 09/29/22 0332 09/29/22 0419 09/29/22 0754  BP: (!) 130/119 108/69 95/63 102/67  Pulse: (!) 54 (!) 56 (!) 57 (!) 59  Resp:   16 18  Temp:   98.5 F (36.9 C) 97.9 F (36.6 C)  TempSrc:   Oral Oral  SpO2: 97% 97% 100% 94%  Weight:      Height:        GEN- The patient is well appearing, alert and oriented x 3 today.   HEENT: normocephalic, atraumatic; sclera clear, conjunctiva pink; hearing intact; oropharynx clear; neck supple  Lungs- Clear to ausculation bilaterally, normal work of breathing.  No wheezes, rales, rhonchi Heart- Regular rate and rhythm, no murmurs, rubs or gallops  GI- soft, non-tender, non-distended, bowel sounds present  Extremities- no clubbing, cyanosis, or edema; DP/PT/radial pulses 2+ bilaterally, groin without hematoma/bruit MS- no significant deformity or atrophy Skin- warm and dry, no rash or lesion Psych- euthymic mood, full affect Neuro- strength and sensation are intact   Labs:   Lab Results  Component Value Date   WBC 7.3 09/07/2022   HGB 15.0 09/07/2022   HCT 45.1 09/07/2022   MCV 86 09/07/2022   PLT 226 09/07/2022   No results for input(s): "NA", "K", "CL", "CO2", "BUN", "CREATININE", "CALCIUM", "PROT", "BILITOT", "ALKPHOS", "ALT", "AST", "GLUCOSE" in the last 168 hours.  Invalid input(s): "LABALBU"   Discharge Medications:  Allergies as of 09/29/2022       Reactions   Morphine And Related    Severe HA   Nitrofurantoin Nausea And Vomiting  Medication List     TAKE these medications    acetaminophen 500 MG tablet Commonly known as: TYLENOL Take 500 mg by mouth every 6 (six) hours as needed (pain.).   ALPRAZolam 0.5 MG tablet Commonly known as: XANAX TAKE 1 TABLET BY MOUTH THREE TIMES DAILY AS NEEDED FOR ANXIETY   amiodarone 200 MG tablet Commonly known as: PACERONE TAKE 1 TABLET BY MOUTH  DAILY   apixaban 5 MG Tabs tablet Commonly known as: ELIQUIS Take 1 tablet (5 mg total) by mouth 2 (two) times daily.   clobetasol cream 0.05 % Commonly known as: TEMOVATE APPLY ONE APPLICATION TOPICALLY TWO TIMES DAILY What changed:  how much to take how to take this when to take this reasons to take this additional instructions   furosemide 40 MG tablet Commonly known as: Lasix Take 1 tablet (40 mg total) by mouth daily.   losartan 50 MG tablet Commonly known as: COZAAR Take 1 tablet (50 mg total) by mouth daily. Start taking on: September 30, 2022   metoprolol tartrate 25 MG tablet Commonly known as: LOPRESSOR Take 0.5 tablets (12.5 mg total) by mouth 2 (two) times daily. What changed: how much to take   ondansetron 4 MG tablet Commonly known as: ZOFRAN Take 1 tablet (4 mg total) by mouth every 6 (six) hours as needed for nausea.   pantoprazole 40 MG tablet Commonly known as: PROTONIX TAKE 1 TABLET BY MOUTH  DAILY   PARoxetine 20 MG tablet Commonly known as: PAXIL TAKE 1 TABLET BY MOUTH  TWICE DAILY   polyethylene glycol 17 g packet Commonly known as: MIRALAX / GLYCOLAX Take 17 g by mouth daily. What changed:  when to take this reasons to take this   potassium chloride SA 20 MEQ tablet Commonly known as: KLOR-CON M TAKE 1 TABLET BY MOUTH DAILY        Disposition:    Follow-up Information     Fenton, Clint R, PA Follow up.   Specialty: Cardiology Why: on the 6th floor of the Hospital, on 11/28 at 230 pm for post ablation check. Use the main hospital Entrance and come to the Afib clinic. Contact information: [place] [address] [phone]                 Duration of Discharge Encounter: Greater than 30 minutes including physician time.  Signed, Kristina Friar, PA-C  09/29/2022 9:24 AM   I have seen and examined this patient with Kristina Dougherty.  Agree with above, note added to reflect my findings.  Patient post  AF ablation yesterday. BP low post ablation, likely due to anesthesia effects. Remains in sinus rhythm today. BP improved. Plan discharge with follow up in clinic.  GEN: Well nourished, well developed, in no acute distress  HEENT: normal  Neck: no JVD, carotid bruits, or masses Cardiac: RRR; no murmurs, rubs, or gallops,no edema  Respiratory:  clear to auscultation bilaterally, normal work of breathing GI: soft, nontender, nondistended, + BS MS: no deformity or atrophy  Skin: warm and dry Neuro:  Strength and sensation are intact Psych: euthymic mood, full affect     Katina Remick M. Dondrea Clendenin MD 09/29/2022 9:29 AM

## 2022-09-28 NOTE — H&P (Signed)
Electrophysiology Office Note   Date:  09/28/2022   ID:  Kristina, Dougherty [birth year], MRN [id number]  PCP:  Susy Frizzle, MD  Cardiologist:  Radford Pax Primary Electrophysiologist:  Mckenize Mezera Meredith Leeds, MD    Chief Complaint: AF   History of Present Illness: Kristina Dougherty is a 76 y.o. female who is being seen today for the evaluation of AF at the request of No ref. provider found. Presenting today for electrophysiology evaluation.  She has a history significant for hypertension, anxiety/depression.  She was admitted to the hospital January 2023 with new onset rapid atrial fibrillation.  She has had a few return visits to atrial fibrillation clinic with rapid episodes of atrial fibrillation.  She had a cardioversion, but unfortunately has since gone back into atrial fibrillation.  She has palpitations, weakness, fatigue due to her atrial fibrillation.  Today, denies symptoms of palpitations, chest pain, shortness of breath, orthopnea, PND, lower extremity edema, claudication, dizziness, presyncope, syncope, bleeding, or neurologic sequela. The patient is tolerating medications without difficulties. Plan ablation today.    Past Medical History:  Diagnosis Date   Anxiety    Arthritis    Atrial fibrillation ([place])    Deaf, right    Depression    Hypertension    Osteopenia    Past Surgical History:  Procedure Laterality Date   ABDOMINAL HYSTERECTOMY     APPENDECTOMY     BUBBLE STUDY  01/09/2022   Procedure: BUBBLE STUDY;  Surgeon: [name], MD;  Location: [place];  Service: Cardiovascular;;   CARDIOVERSION N/A 01/09/2022   Procedure: CARDIOVERSION;  Surgeon: [name], MD;  Location: [place];  Service: Cardiovascular;  Laterality: N/A;   CARDIOVERSION N/A 01/20/2022   Procedure: CARDIOVERSION;  Surgeon: Jerline Pain, MD;  Location: [place];  Service: Cardiovascular;  Laterality: N/A;   CARDIOVERSION N/A 04/05/2022   Procedure:  CARDIOVERSION;  Surgeon: Jerline Pain, MD;  Location[facility] END[facility]COPY;  Service: Cardiovascular;  Laterality: N/A;   TEE WITHOUT CARDIOVERSION N/A 01/09/2022   Procedure: TRANSESOPHAGEAL ECHOCARDIOGRAM (TEE);  Surgeon: [name], MD;  Location: [place];  Service: Cardiovascular;  Laterality: N/A;   TEE WITHOUT CARDIOVERSION N/A 01/20/2022   Procedure: TRANSESOPHAGEAL ECHOCARDIOGRAM (TEE);  Surgeon: Jerline Pain, MD;  Location: [facility] ENDOSCOPY;  Service: Cardiovascular;  Laterality: N/A;     Current Facility-Administered Medications  Medication Dose Route Frequency Provider Last Rate Last Admin   0.9 %  sodium chloride infusion   Intravenous Continuous Daxten Kovalenko, Ocie Doyne, MD       acetaminophen (TYLENOL) 500 MG tablet            acetaminophen (TYLENOL) tablet 1,000 mg  1,000 mg Oral Once Annye Asa, MD        Allergies:   Morphine and related and Nitrofurantoin   Social History:  The patient  reports that she quit smoking about 13 months ago. Her smoking use included cigarettes. She smoked an average of .3 packs per day. She has never used smokeless tobacco. She reports that she does not drink alcohol and does not use drugs.   Family History:  The patient's family history includes Hypertension in her maternal grandmother.   ROS:  Please see the history of present illness.   Otherwise, review of systems is positive for none.   All other systems are reviewed and negative.   PHYSICAL EXAM: VS:  BP (!) 119/99   Pulse 91   Temp (!) 97.4 F (36.3 C) (Temporal)   Resp  17   Ht '5\' 2"'$  (1.575 m)   Wt 72.6 kg   SpO2 96%   BMI 29.26 kg/m  , BMI Body mass index is 29.26 kg/m. GEN: Well nourished, well developed, in no acute distress  HEENT: normal  Neck: no JVD, carotid bruits, or masses Cardiac: irregular; no murmurs, rubs, or gallops,no edema  Respiratory:  clear to auscultation bilaterally, normal work of breathing GI: soft, nontender, nondistended, + BS MS: no  deformity or atrophy  Skin: warm and dry Neuro:  Strength and sensation are intact Psych: euthymic mood, full affect   Recent Labs: 01/19/2022: B Natriuretic Peptide 263.3 02/09/2022: ALT 16; Magnesium 2.1; TSH 4.14 09/07/2022: BUN 24; Creatinine, Ser 1.19; Hemoglobin 15.0; Platelets 226; Potassium 4.8; Sodium 143    Lipid Panel     Component Value Date/Time   CHOL 237 (H) 11/22/2016 1451   TRIG 90 11/22/2016 1451   HDL 71 11/22/2016 1451   CHOLHDL 3.3 11/22/2016 1451   VLDL 18 11/22/2016 1451   LDLCALC 148 (H) 11/22/2016 1451   LDLDIRECT 127.0 08/14/2012 1558     Wt Readings from Last 3 Encounters:  09/28/22 72.6 kg  06/05/22 73.4 kg  04/26/22 75.3 kg      Other studies Reviewed: Additional studies/ records that were reviewed today include: TEE 03/14/22  Review of the above records today demonstrates:   1. Left ventricular ejection fraction, by estimation, is 55 to 60%. The  left ventricle has normal function. The left ventricle has no regional  wall motion abnormalities.   2. Right ventricular systolic function is normal. The right ventricular  size is normal.   3. No left atrial/left atrial appendage thrombus was detected.   4. The mitral valve is normal in structure. Trivial mitral valve  regurgitation. No evidence of mitral stenosis.   5. The aortic valve is tricuspid. Aortic valve regurgitation is not  visualized. No aortic stenosis is present.   6. The inferior vena cava is normal in size with greater than 50%  respiratory variability, suggesting right atrial pressure of 3 mmHg.   7. Evidence of atrial level shunting detected by color flow Doppler.  Agitated saline contrast bubble study was positive with shunting observed  within 3-6 cardiac cycles suggestive of interatrial shunt.    ASSESSMENT AND PLAN:  1.  Persistent atrial fibrillation: Kristina Dougherty has presented today for surgery, with the diagnosis of AF.  The various methods of treatment have been  discussed with the patient and family. After consideration of risks, benefits and other options for treatment, the patient has consented to  Procedure(s): Catheter ablation as a surgical intervention .  Risks include but not limited to complete heart block, stroke, esophageal damage, nerve damage, bleeding, vascular damage, tamponade, perforation, MI, and death. The patient's history has been reviewed, patient examined, no change in status, stable for surgery.  I have reviewed the patient's chart and labs.  Questions were answered to the patient's satisfaction.    Dee Maday Curt Bears, MD 09/28/2022 11:24 AM

## 2022-09-28 NOTE — Progress Notes (Signed)
Dr. Curt Bears and Dr. Glennon Mac made aware of pts systolic BP. Dr. Glennon Mac gave orders to start albumin at this time. Pt A&Ox4 with nor complaints

## 2022-09-28 NOTE — Anesthesia Procedure Notes (Signed)
Procedure Name: Intubation Date/Time: 09/28/2022 12:14 PM  Performed by: Gwyndolyn Saxon, CRNAPre-anesthesia Checklist: Patient identified, Emergency Drugs available, Suction available and Patient being monitored Patient Re-evaluated:Patient Re-evaluated prior to induction Oxygen Delivery Method: Circle system utilized Preoxygenation: Pre-oxygenation with 100% oxygen Induction Type: IV induction Ventilation: Mask ventilation without difficulty Laryngoscope Size: Miller and 2 Grade View: Grade I Tube type: Oral Tube size: 7.0 mm Number of attempts: 1 Airway Equipment and Method: Stylet Placement Confirmation: ETT inserted through vocal cords under direct vision, positive ETCO2 and breath sounds checked- equal and bilateral Secured at: 20 cm Tube secured with: Tape Dental Injury: Teeth and Oropharynx as per pre-operative assessment  Comments: Pt with poor dentition prior to induction; multiple cracked/chipped teeth. Top right loose. All dentition intact after intubation.

## 2022-09-28 NOTE — Progress Notes (Signed)
Updated Dr. Curt Bears and Dr. Glennon Mac of pts BP. Currently has maintained pressures in the high 48A systolic. Dr. Curt Bears gave orders to administer 500 ccs of fluids. Pt remains A&O x4. No complaints of pain or dizziness at this time.

## 2022-09-29 ENCOUNTER — Encounter (HOSPITAL_COMMUNITY): Payer: Self-pay | Admitting: Cardiology

## 2022-09-29 DIAGNOSIS — I4819 Other persistent atrial fibrillation: Secondary | ICD-10-CM

## 2022-09-29 DIAGNOSIS — I10 Essential (primary) hypertension: Secondary | ICD-10-CM | POA: Diagnosis not present

## 2022-09-29 DIAGNOSIS — I959 Hypotension, unspecified: Secondary | ICD-10-CM | POA: Diagnosis not present

## 2022-09-29 DIAGNOSIS — F32A Depression, unspecified: Secondary | ICD-10-CM | POA: Diagnosis not present

## 2022-09-29 DIAGNOSIS — Z79899 Other long term (current) drug therapy: Secondary | ICD-10-CM | POA: Diagnosis not present

## 2022-09-29 DIAGNOSIS — Z87891 Personal history of nicotine dependence: Secondary | ICD-10-CM | POA: Diagnosis not present

## 2022-09-29 MED ORDER — LOSARTAN POTASSIUM 50 MG PO TABS: 50.0000 mg | ORAL_TABLET | Freq: Every day | ORAL | 2 refills | Status: DC

## 2022-09-29 MED ORDER — METOPROLOL TARTRATE 25 MG PO TABS: 12.5000 mg | ORAL_TABLET | Freq: Two times a day (BID) | ORAL | 2 refills | Status: DC

## 2022-10-15 ENCOUNTER — Other Ambulatory Visit: Payer: Self-pay | Admitting: Family Medicine

## 2022-10-24 ENCOUNTER — Encounter (HOSPITAL_COMMUNITY): Payer: Self-pay | Admitting: Physician Assistant

## 2022-10-24 ENCOUNTER — Ambulatory Visit (HOSPITAL_COMMUNITY)
Admission: RE | Admit: 2022-10-24 | Discharge: 2022-10-24 | Disposition: A | Discharge status: Home / Self Care | Payer: Medicare Other | Source: Ambulatory Visit | Attending: Physician Assistant | Admitting: Physician Assistant

## 2022-10-24 VITALS — BP 110/80 | HR 48 | Ht 62.0 in | Wt 165.6 lb

## 2022-10-24 DIAGNOSIS — D6869 Other thrombophilia: Secondary | ICD-10-CM | POA: Diagnosis not present

## 2022-10-24 DIAGNOSIS — Z7901 Long term (current) use of anticoagulants: Secondary | ICD-10-CM | POA: Insufficient documentation

## 2022-10-24 DIAGNOSIS — R001 Bradycardia, unspecified: Secondary | ICD-10-CM | POA: Diagnosis not present

## 2022-10-24 DIAGNOSIS — I5022 Chronic systolic (congestive) heart failure: Secondary | ICD-10-CM | POA: Diagnosis not present

## 2022-10-24 DIAGNOSIS — I110 Hypertensive heart disease with heart failure: Secondary | ICD-10-CM | POA: Diagnosis not present

## 2022-10-24 DIAGNOSIS — I4819 Other persistent atrial fibrillation: Secondary | ICD-10-CM | POA: Insufficient documentation

## 2022-10-24 DIAGNOSIS — Z79899 Other long term (current) drug therapy: Secondary | ICD-10-CM | POA: Diagnosis not present

## 2022-10-24 NOTE — Progress Notes (Signed)
Primary Care Physician: Susy Frizzle, MD Referring Physician: Hospital F/u  Primary EP: Dr Curt Bears   Kristina Dougherty is a 76 y.o. female with a h/o HTN, anxiety/depression, that was admitted 12/04/21 to 12/06/21 at [facility] with new onset afib with RVR. She was rate controlled with IV Cardizem and then switched to metoprolol 50 mg bid.  She was also started on eliquis 5 mg bid for a CHA2DS2VASc  score of 4.   In the clinic on f/u, her EKG showed afib with RVR at 162 bpm. She had gained 10  lbs of fluid. She is having shortness of breath  and she stopped both eliquis and metoprolol as she felt the drugs were making her worse. She feels very full thru her abdomen which effects her appetite and has ++ pedal edema, does not describe PND/orthopnea. Her HCTZ was stopped in the hospital. She is here with her son today. She had a fall last week but was not on anticoagulation at the time.    F/u in the afib clinic, 01/03/22. She now has had on her scales at home, in her PJ's, around a 10 lb weight loss, since lasix was started last visit  here with clothes, 6 lbs. Since back on metoprolol, she is now running 117-130 bpm in afib  vrs 160bpm last week when I saw her not on metoprolol. She has been taking her eliquis on  a regular basis since last week. She feels improved but still with a lot of fatigue and shortness of breath. Still no PND/orthopnea. We discussed that she has a soft BP limiting up titration o BB. Since she has not been on anticoagulation x 3 weeks, and I am concerned she may have deterioration in her status,   will go ahead and schedule TEE/cardioversion for next Monday, which is next available. She and her son are in agreement. Still remind them to have a low threshold to go to ER if symptoms worsen.   F/u in afib clinic 03/02/22. She is feeling so much better as she is taking her lasix correctly and her weight is down around 6 lbs. She remains in rate controlled afib and now that she has loaded on  amiodarone for another month will give it one more try at cardioversion to see if can convert her to SR. She is being compliant with anticoagulation.  F/u 03/28/22. Pt decided that her body needed a rest and cancelled her cardioversion after I saw her last. I reviewed with her today the her echo showed 40 to 45 % EF and with prior admits for HF  and with her being loaded on amiodarone it may be for her overall cardiac health and to prevent future  issues with heart failure if she carried out another cardioversion. She has not missed any anticoagulation. She consented to try to purse another cardioversion.She continues on amiodarone 200 mg daily. Overall she is feeling ok but tired.   F/u in afib clinic, 04/12/22.  She had a successful cardioversion. Her BB was reduced 2/2 brady ata time of cardioversion but will reduce again today with HR in the 40's form which she ius not symptomatic. She feels improved in SR. She did hit her hand with a stream of water form a pressure washer 5/12. Area observed today. No  redness nor heat. One inch abrasion on thumb area, appears to be healing.   Follow up in the AF clinic 04/26/22. Unfortunately, patient is back in rate controlled afib today. She  woke with palpitations after a nightmare on 04/23/22. Overall, she is not as symptomatic with her afib this time. She does have palpitations intermittently. No symptoms of fluid overload.   Follow up in the AF clinic 10/24/22. Patient is s/p afib ablation with Dr Curt Bears on 09/28/22. She reports that she has had only one episode of afib lasting roughly 5 minutes. She does occasionally get lightheaded when moving to a standing position.   Today, she denies symptoms of palpitations, chest pain, shortness of breath, orthopnea, PND, lower extremity edema, dizziness, presyncope, syncope, or neurologic sequela. The patient is tolerating medications without difficulties and is otherwise without complaint today.   Past Medical History:   Diagnosis Date   Anxiety    Arthritis    Atrial fibrillation ([place])    Deaf, right    Depression    Hypertension    Osteopenia    Past Surgical History:  Procedure Laterality Date   ABDOMINAL HYSTERECTOMY     APPENDECTOMY     ATRIAL FIBRILLATION ABLATION N/A 09/28/2022   Procedure: ATRIAL FIBRILLATION ABLATION;  Surgeon: Constance Haw, MD;  Location: [facility];  Service: Cardiovascular;  Laterality: N/A;   BUBBLE STUDY  01/09/2022   Procedure: BUBBLE STUDY;  Surgeon: [name], MD;  Location: [place];  Service: Cardiovascular;;   CARDIOVERSION N/A 01/09/2022   Procedure: CARDIOVERSION;  Surgeon: [name], MD;  Location: [place];  Service: Cardiovascular;  Laterality: N/A;   CARDIOVERSION N/A 01/20/2022   Procedure: CARDIOVERSION;  Surgeon: Jerline Pain, MD;  Location: [facility] ENDOSCOPY;  Service: Cardiovascular;  Laterality: N/A;   CARDIOVERSION N/A 04/05/2022   Procedure: CARDIOVERSION;  Surgeon: Jerline Pain, MD;  Location: [facility] ENDOSCOPY;  Service: Cardiovascular;  Laterality: N/A;   TEE WITHOUT CARDIOVERSION N/A 01/09/2022   Procedure: TRANSESOPHAGEAL ECHOCARDIOGRAM (TEE);  Surgeon: [name], MD;  Location: [place];  Service: Cardiovascular;  Laterality: N/A;   TEE WITHOUT CARDIOVERSION N/A 01/20/2022   Procedure: TRANSESOPHAGEAL ECHOCARDIOGRAM (TEE);  Surgeon: Jerline Pain, MD;  Location[facility] END[facility]COPY;  Service: Cardiovascular;  Laterality: N/A;    Current Outpatient Medications  Medication Sig Dispense Refill   acetaminophen (TYLENOL) 500 MG tablet Take 500 mg by mouth every 6 (six) hours as needed (pain.).     ALPRAZolam (XANAX) 0.5 MG tablet TAKE 1 TABLET BY MOUTH THREE TIMES DAILY AS NEEDED FOR ANXIETY 30 tablet 0   amiodarone (PACERONE) 200 MG tablet TAKE 1 TABLET BY MOUTH DAILY 100 tablet 2   apixaban (ELIQUIS) 5 MG TABS tablet Take 1 tablet (5 mg total) by mouth 2 (two) times daily. 180 tablet 2   clobetasol cream  (TEMOVATE) 2.95 % APPLY ONE APPLICATION TOPICALLY TWO TIMES DAILY (Patient taking differently: Apply 1 application  topically 2 (two) times daily as needed (irritation).) 30 g 3   furosemide (LASIX) 40 MG tablet Take 1 tablet (40 mg total) by mouth daily. 90 tablet 1   losartan (COZAAR) 50 MG tablet Take 1 tablet (50 mg total) by mouth daily. 100 tablet 2   metoprolol tartrate (LOPRESSOR) 25 MG tablet Take 0.5 tablets (12.5 mg total) by mouth 2 (two) times daily. 30 tablet 2   ondansetron (ZOFRAN) 4 MG tablet Take 1 tablet (4 mg total) by mouth every 6 (six) hours as needed for nausea. 20 tablet 0   pantoprazole (PROTONIX) 40 MG tablet TAKE 1 TABLET BY MOUTH  DAILY 100 tablet 2   PARoxetine (PAXIL) 20 MG tablet TAKE 1 TABLET BY MOUTH  TWICE  DAILY 180 tablet 2   polyethylene glycol (MIRALAX / GLYCOLAX) 17 g packet Take 17 g by mouth daily. (Patient taking differently: Take 17 g by mouth daily as needed for moderate constipation.) 14 each 0   potassium chloride SA (KLOR-CON M) 20 MEQ tablet TAKE 1 TABLET BY MOUTH DAILY 100 tablet 3   No current facility-administered medications for this encounter.    Allergies  Allergen Reactions   Morphine And Related     Severe HA   Nitrofurantoin Nausea And Vomiting    Social History   Socioeconomic History   Marital status: Divorced    Spouse name: Not on file   Number of children: Not on file   Years of education: Not on file   Highest education level: Not on file  Occupational History   Not on file  Tobacco Use   Smoking status: Former    Packs/day: 0.30    Types: Cigarettes    Quit date: 08/27/2021    Years since quitting: 1.1   Smokeless tobacco: Never   Tobacco comments:    Former smoker 10/24/22.  Substance and Sexual Activity   Alcohol use: No   Drug use: No   Sexual activity: Never  Other Topics Concern   Not on file  Social History Narrative   Not on file   Social Determinants of Health   Financial Resource Strain: Low Risk   (03/23/2022)   Overall Financial Resource Strain (CARDIA)    Difficulty of Paying Living Expenses: Not hard at all  Food Insecurity: No Food Insecurity (09/29/2022)   Hunger Vital Sign    Worried About Running Out of Food in the Last Year: Never true    Ran Out of Food in the Last Year: Never true  Transportation Needs: No Transportation Needs (09/29/2022)   PRAPARE - Hydrologist (Medical): No    Lack of Transportation (Non-Medical): No  Physical Activity: Sufficiently Active (03/23/2022)   Exercise Vital Sign    Days of Exercise per Week: 4 days    Minutes of Exercise per Session: 40 min  Stress: No Stress Concern Present (03/23/2022)   [place]    Feeling of Stress : Not at all  Social Connections: Moderately Isolated (03/23/2022)   Social Connection and Isolation Panel [NHANES]    Frequency of Communication with Friends and Family: More than three times a week    Frequency of Social Gatherings with Friends and Family: More than three times a week    Attends Religious Services: 1 to 4 times per year    Active Member of [employer] or Organizations: No    Attends Archivist Meetings: Never    Marital Status: Divorced  Human resources officer Violence: Not At Risk (09/29/2022)   Humiliation, Afraid, Rape, and Kick questionnaire    Fear of Current or Ex-Partner: No    Emotionally Abused: No    Physically Abused: No    Sexually Abused: No    Family History  Problem Relation Age of Onset   Hypertension Maternal Grandmother     ROS- All systems are reviewed and negative except as per the HPI above  Physical Exam: Vitals:   10/24/22 1445  BP: 110/80  Pulse: (!) 48  Weight: 75.1 kg  Height: '5\' 2"'$  (1.575 m)    Wt Readings from Last 3 Encounters:  10/24/22 75.1 kg  09/28/22 72.6 kg  06/05/22 73.4 kg    Labs: Lab  Results  Component Value Date   NA 143 09/07/2022   K 4.8  09/07/2022   CL 101 09/07/2022   CO2 28 09/07/2022   GLUCOSE 88 09/07/2022   BUN 24 09/07/2022   CREATININE 1.19 (H) 09/07/2022   CALCIUM 10.0 09/07/2022   PHOS 3.7 01/22/2022   MG 2.1 02/09/2022   No results found for: "INR" Lab Results  Component Value Date   CHOL 237 (H) 11/22/2016   HDL 71 11/22/2016   LDLCALC 148 (H) 11/22/2016   TRIG 90 11/22/2016     GEN- The patient is a well appearing elderly female, alert and oriented x 3 today.   HEENT-head normocephalic, atraumatic, sclera clear, conjunctiva pink, hearing intact, trachea midline. Lungs- Clear to ausculation bilaterally, normal work of breathing Heart- Regular rate and rhythm, bradycardia, no murmurs, rubs or gallops  GI- soft, NT, ND, + BS Extremities- no clubbing, cyanosis, or edema MS- no significant deformity or atrophy Skin- no rash or lesion Psych- euthymic mood, full affect Neuro- strength and sensation are intact    EKG  SB, 1st degree AV block Vent. rate 48 BPM PR interval 228 ms QRS duration 76 ms QT/QTcB 518/462 ms   Echo-    1. Left ventricular ejection fraction, by estimation, is 40 to 45%. The left ventricle has mildly decreased function. The left ventricle demonstrates global hypokinesis. Left ventricular diastolic parameters are indeterminate.   2. Right ventricular systolic function is normal. The right ventricular size is mildly enlarged. There is normal pulmonary artery systolic pressure. The estimated right ventricular systolic pressure is 73.2 mmHg.   3. The pericardial effusion is circumferential. There is no evidence of cardiac tamponade.   4. The mitral valve is normal in structure. Mild mitral valve  regurgitation. No evidence of mitral stenosis.   5. Tricuspid valve regurgitation is moderate.   6. The aortic valve is normal in structure. Aortic valve regurgitation is not visualized. No aortic stenosis is present.   7. The inferior vena cava is dilated in size with <50% respiratory   variability, suggesting right atrial pressure of 15 mmHg.   Comparison(s): Prior images reviewed side by side. The left ventricular function is worsened.    Assessment and Plan:  1. Persistent afib  S/p afib ablation 09/28/22 Patient appears to be maintaining SR.  Will discontinue Lopressor with bradycardia and lightheadedness.  Continue amiodarone 200 mg daily  Continue Eliquis 5 mg BID with no missed doses for 3 months post ablation.   2. CHA2DS2VASc score of at least 5 Continue Eliquis 5 mg BID  3. HTN Stable, med changes as above.   4. Chronic systolic CHF EF 20-25% Fluid status appears stable.   Follow up with Dr Curt Bears as scheduled.    [facility] [address] [place], [place] [address] [phone]

## 2022-10-24 NOTE — Patient Instructions (Signed)
Stop metoprolol

## 2022-10-26 ENCOUNTER — Ambulatory Visit (HOSPITAL_COMMUNITY): Payer: Medicare Other | Admitting: Physician Assistant

## 2022-11-15 ENCOUNTER — Other Ambulatory Visit (HOSPITAL_COMMUNITY): Payer: Self-pay | Admitting: Nurse Practitioner

## 2022-12-24 ENCOUNTER — Other Ambulatory Visit: Payer: Self-pay | Admitting: Family Medicine

## 2022-12-24 DIAGNOSIS — I10 Essential (primary) hypertension: Secondary | ICD-10-CM

## 2022-12-25 NOTE — Telephone Encounter (Signed)
Medication was discontinued 04/16/2022 - med not on med list. Requested Prescriptions  Pending Prescriptions Disp Refills   amLODipine (NORVASC) 10 MG tablet [Pharmacy Med Name: amLODIPine Besylate 10 MG Oral Tablet] 100 tablet 2    Sig: TAKE 1 TABLET BY MOUTH DAILY     Cardiovascular: Calcium Channel Blockers 2 Failed - 12/24/2022 11:20 PM      Failed - Valid encounter within last 6 months    Recent Outpatient Visits           10 months ago Atrial fibrillation, unspecified type ([name])   [place] Susy Frizzle, MD   11 months ago Persistent atrial fibrillation ([name])   [place] Pickard, Cammie Mcgee, MD   1 year ago Atrial fibrillation, unspecified type [name])   [place] Pickard, Cammie Mcgee, MD   3 years ago Bloating   [name] Susy Frizzle, MD   4 years ago Benign essential HTN   [name], Cammie Mcgee, MD       Future Appointments             In 1 week Constance Haw, MD [place] [facility] [place] BP in normal range    BP Readings from Last 1 Encounters:  10/24/22 110/80         Passed - Last Heart Rate in normal range    Pulse Readings from Last 1 Encounters:  10/24/22 (!) 48

## 2023-01-01 ENCOUNTER — Ambulatory Visit: Payer: Medicare Other | Admitting: Cardiology

## 2023-01-03 ENCOUNTER — Other Ambulatory Visit: Payer: Self-pay | Admitting: Family Medicine

## 2023-01-03 DIAGNOSIS — I10 Essential (primary) hypertension: Secondary | ICD-10-CM

## 2023-01-04 ENCOUNTER — Encounter (HOSPITAL_COMMUNITY): Payer: Self-pay | Admitting: *Deleted

## 2023-01-04 ENCOUNTER — Other Ambulatory Visit: Payer: Self-pay

## 2023-01-04 DIAGNOSIS — Z79899 Other long term (current) drug therapy: Secondary | ICD-10-CM

## 2023-01-05 ENCOUNTER — Ambulatory Visit: Payer: Medicare Other | Attending: Cardiology | Admitting: Cardiology

## 2023-01-16 ENCOUNTER — Telehealth: Payer: Self-pay | Admitting: Pharmacist

## 2023-01-16 NOTE — Progress Notes (Unsigned)
Care Management & Coordination Services Pharmacy Team  Reason for Encounter: Appointment Reminder  Contacted patient to confirm {visittype:27222} appointment with ***, PharmD on *** at ***. {[facility] Outreach:28874}  Do you have any problems getting your medications? {yes/no:20286} If yes what types of problems are you experiencing? {Problems:27223}  What is your top health concern you would like to discuss at your upcoming visit?   Have you seen any other providers since your last visit with PCP? {yes/no:20286}   Chart review:  Recent office visits:  None in previous 6 months  Recent consult visits:  None in previous 6 months  Hospital visits:  09/28/2022 Admission for procedure ATRIAL FIBRILLATION ABLATION   Star Rating Drugs:  Losartan 50 mg last filled 12/18/2022 100 DS   Care Gaps: Annual wellness visit in last year? Yes   Future Appointments  Date Time Provider [place]  01/18/2023 11:00 AM [place]ythe Clarity, [place] None  03/29/2023  3:45 PM [facility] HEALTH ADVISOR [id number]   April D Calhoun, [place] Pharmacist Assistant [phone]

## 2023-01-18 ENCOUNTER — Ambulatory Visit: Payer: Medicare Other | Admitting: Pharmacist

## 2023-01-18 NOTE — Progress Notes (Signed)
Care Management & Coordination Services Pharmacy Note  01/18/2023 Name:  Kristina Dougherty MRN:  [id number] DOB:  09/01/1946  Summary: Initial visit with PharmD.  Patient with Afib no LDL on file since 2017 when it was 148.  Not on statin - had her make physical today.  Recheck lipids - consider statin if LDL still elevated.  Also due for DEXA scan.  CHF with euvolemia and CKD so could also consider addition of Jardiance/Farxiga.  Recommendations/Changes made from today's visit: FU 4 months Consider SGLT-2 and order DEXA  Follow up plan: 4 months with PharmD   Subjective: Kristina Dougherty is an 77 y.o. year old female who is a primary patient of Pickard, Cammie Mcgee, MD.  The care coordination team was consulted for assistance with disease management and care coordination needs.    Engaged with patient face to face for initial visit.  Recent office visits:  None in previous 6 months   Recent consult visits:  None in previous 6 months   Hospital visits:  09/28/2022 Admission for procedure ATRIAL FIBRILLATION ABLATION   Objective:  Lab Results  Component Value Date   CREATININE 1.19 (H) 09/07/2022   BUN 24 09/07/2022   GFR 84.18 08/14/2012   EGFR 47 (L) 09/07/2022   GFRNONAA 46 (L) 03/28/2022   GFRAA 69 04/12/2018   NA 143 09/07/2022   K 4.8 09/07/2022   CALCIUM 10.0 09/07/2022   CO2 28 09/07/2022   GLUCOSE 88 09/07/2022    Lab Results  Component Value Date/Time   HGBA1C 5.3 12/19/2006 08:14 AM   GFR 84.18 08/14/2012 03:58 PM    Last diabetic Eye exam: No results found for: "HMDIABEYEEXA"  Last diabetic Foot exam: No results found for: "HMDIABFOOTEX"   Lab Results  Component Value Date   CHOL 237 (H) 11/22/2016   HDL 71 11/22/2016   LDLCALC 148 (H) 11/22/2016   LDLDIRECT 127.0 08/14/2012   TRIG 90 11/22/2016   CHOLHDL 3.3 11/22/2016       Latest Ref Rng & Units 02/09/2022   10:57 AM 01/22/2022    2:59 AM 01/21/2022    2:19 AM  Hepatic Function  Total  Protein 6.1 - 8.1 g/dL 6.6  4.9  5.1   Albumin 3.5 - 5.0 g/dL  2.7  2.9   AST 10 - 35 U/L '16  17  17   '$ ALT 6 - 29 U/L '16  12  15   '$ Alk Phosphatase 38 - 126 U/L  51  54   Total Bilirubin 0.2 - 1.2 mg/dL 0.5  1.0  0.4     Lab Results  Component Value Date/Time   TSH 4.14 02/09/2022 10:57 AM   TSH 5.905 (H) 01/17/2022 03:37 PM   TSH 3.499 12/04/2021 09:09 PM   TSH 1.37 03/15/2016 08:14 AM   FREET4 1.05 01/18/2022 01:55 AM       Latest Ref Rng & Units 09/07/2022   11:12 AM 03/28/2022    2:20 PM 03/02/2022    2:37 PM  CBC  WBC 3.4 - 10.8 x10E3/uL 7.3  7.2  8.4   Hemoglobin 11.1 - 15.9 g/dL 15.0  15.3  15.5   Hematocrit 34.0 - 46.6 % 45.1  46.6  46.4   Platelets 150 - 450 x10E3/uL 226  225  195     Lab Results  Component Value Date/Time   VD25OH 23 (L) 03/15/2016 08:14 AM   VD25OH 24 (L) 03/08/2015 09:46 AM    Clinical ASCVD: Yes  The ASCVD  Risk score (Arnett DK, et al., 2019) failed to calculate for the following reasons:   Cannot find a previous HDL lab   Cannot find a previous total cholesterol lab        03/23/2022    3:58 PM 01/27/2022   10:17 AM 08/06/2017   10:33 AM  Depression screen PHQ 2/9  Decreased Interest 0 0 0  Down, Depressed, Hopeless 0 0 0  PHQ - 2 Score 0 0 0     Social History   Tobacco Use  Smoking Status Former   Packs/day: 0.30   Types: Cigarettes   Quit date: 08/27/2021   Years since quitting: 1.3  Smokeless Tobacco Never  Tobacco Comments   Former smoker 10/24/22.   BP Readings from Last 3 Encounters:  10/24/22 110/80  09/29/22 102/67  06/05/22 100/64   Pulse Readings from Last 3 Encounters:  10/24/22 (!) 48  09/29/22 (!) 59  06/05/22 (!) 104   Wt Readings from Last 3 Encounters:  10/24/22 165 lb 9.6 oz (75.1 kg)  09/28/22 160 lb (72.6 kg)  06/05/22 161 lb 12.8 oz (73.4 kg)   BMI Readings from Last 3 Encounters:  10/24/22 30.29 kg/m  09/28/22 29.26 kg/m  06/05/22 29.59 kg/m    Allergies  Allergen Reactions   Morphine  And Related     Severe HA   Nitrofurantoin Nausea And Vomiting    Medications Reviewed Today     Reviewed by Edythe Clarity, [facility] (Pharmacist) on 01/18/23 at 1501  Med List Status: <None>   Medication Order Taking? Sig Documenting Provider Last Dose Status Informant  acetaminophen (TYLENOL) 500 MG tablet [id number] Yes Take 500 mg by mouth every 6 (six) hours as needed (pain.). [provider] Taking Active Self  ALPRAZolam Duanne Moron) 0.5 MG tablet [id number] Yes TAKE 1 TABLET BY MOUTH THREE TIMES DAILY AS NEEDED FOR ANXIETY Rubie Maid, FNP Taking Active   amiodarone (PACERONE) 200 MG tablet [id number] Yes TAKE 1 TABLET BY MOUTH DAILY Sherran Needs, NP Taking Active Self  apixaban (ELIQUIS) 5 MG TABS tablet [id number]  Take 1 tablet (5 mg total) by mouth 2 (two) times daily. Susy Frizzle, MD  Expired 10/24/22 2359 Self  clobetasol cream (TEMOVATE) 0.05 % [id number] Yes APPLY ONE APPLICATION TOPICALLY TWO TIMES DAILY  Patient taking differently: Apply 1 application  topically 2 (two) times daily as needed (irritation).   Susy Frizzle, MD Taking Active Self  furosemide (LASIX) 40 MG tablet [id number] Yes Take 1 tablet (40 mg total) by mouth daily. Sherran Needs, NP Taking Active Self  losartan (COZAAR) 50 MG tablet [id number] Yes Take 1 tablet (50 mg total) by mouth daily. Shirley Friar, PA-C Taking Active   ondansetron [facility]) 4 MG tablet [id number] Yes Take 1 tablet (4 mg total) by mouth every 6 (six) hours as needed for nausea. Delora Fuel, MD Taking Active Self  pantoprazole (PROTONIX) 40 MG tablet [id number] Yes TAKE 1 TABLET BY MOUTH DAILY Susy Frizzle, MD Taking Active   PARoxetine (PAXIL) 20 MG tablet [id number] Yes TAKE 1 TABLET BY MOUTH  TWICE DAILY Susy Frizzle, MD Taking Active Self  polyethylene glycol (MIRALAX / GLYCOLAX) 17 g packet [id number] Yes Take 17 g by mouth daily.  Patient taking differently: Take 17 g by mouth daily as needed for  moderate constipation.   Raiford Noble Gladwin, DO Taking Active Self  potassium chloride SA (KLOR-CON M) 20 MEQ tablet [id number] Yes TAKE 1 TABLET BY MOUTH DAILY  Sherran Needs, NP Taking Active Self            SDOH:  (Social Determinants of Health) assessments and interventions performed: Yes Financial Resource Strain: Low Risk  (01/18/2023)   Overall Financial Resource Strain (CARDIA)    Difficulty of Paying Living Expenses: Not very hard   Food Insecurity: No Food Insecurity (01/18/2023)   Hunger Vital Sign    Worried About Running Out of Food in the Last Year: Never true    Ran Out of Food in the Last Year: Never true    SDOH Interventions    Flowsheet Row Clinical Support from 03/23/2022 in [place] Management from 01/27/2022 in [place] Interventions    Food Insecurity Interventions Intervention Not Indicated Intervention Not Indicated  Housing Interventions Intervention Not Indicated --  Transportation Interventions Intervention Not Indicated Intervention Not Indicated  Financial Strain Interventions Intervention Not Indicated --  Physical Activity Interventions Intervention Not Indicated --  Stress Interventions Intervention Not Indicated --  Social Connections Interventions Intervention Not Indicated --       Medication Assistance: None required.  Patient affirms current coverage meets needs.  Medication Access: Within the past 30 days, how often has patient missed a dose of medication? 0 Is a pillbox or other method used to improve adherence? Yes  Factors that may affect medication adherence? no barriers identified Are meds synced by current pharmacy? No  Are meds delivered by current pharmacy? Yes  Does patient experience delays in picking up medications due to transportation concerns? No   [facility] Services Reviewed: Is patient disadvantaged to use [facility]?: Yes  Current Rx insurance plan:  [place] Name and location of Current pharmacy:  [facility] [address] [place] [address] Phone: [phone] Fax: [place] [address] [place]. [place]. [place] [address] Phone: [phone] Fax: [place], [place] [address] [place] [address] Phone: [phone] Fax: [phone]  [facility] services reviewed with patient today?: Yes  Patient requests to transfer care to [facility]?: No  Reason patient declined to change pharmacies: Disadvantaged due to insurance/mail order  Compliance/Adherence/Medication fill history: Losartan 50 mg last filled 12/18/2022 100 DS     Care Gaps: Annual wellness visit in last year? Yes   Assessment/Plan   Hypertension (BP goal <130/80) -Controlled -Current treatment: Losartan '50mg'$  Appropriate, Effective, Safe, Accessible -Medications previously tried: amlodipine, HCTZ, metoprolol, Maxzide  -Current home readings: normal, also on the lower end in office -Denies hypotensive/hypertensive symptoms -Educated on BP goals and benefits of medications for prevention of heart attack, stroke and kidney damage; Exercise goal of 150 minutes per week; Importance of home blood pressure monitoring; -Counseled to monitor BP at home as able, document, and provide log at future appointments -Recommended to continue current medication -Monitor BP at home at least once per week if possible or if symptomatic.    Atrial Fibrillation (Goal: prevent stroke and major bleeding) -Controlled, post ablation -CHADSVASC: 5 -Current treatment: Rate control: Amiodarone '200mg'$  daily Appropriate, Effective, Safe, Accessible Anticoagulation: Eliquis '5mg'$  Bid Appropriate, Effective, Safe, Accessible -Medications previously tried: metoprolol -Home BP and HR readings: normal   -Counseled on increased risk of stroke due to Afib and benefits of anticoagulation for stroke prevention; importance of adherence to anticoagulant exactly as prescribed; bleeding risk associated with  Eliquis and importance of self-monitoring for signs/symptoms of bleeding; -Recommended to continue current medication -Recently stopped Toprol due to soft HR.  Has been doing fine since d/c.  Counseled on rsk factors.  Elevated LDL but last checked in our office was 148 in 2017.  Afib goal < 70.  Recommend we recheck her lipid, asked her to schedule physical (scheduled for March) and we can start statin if needed.  Heart Failure (Goal: manage symptoms and prevent exacerbations) -Controlled -Last ejection fraction: 40-45%  -HF type: HFmrEF (mildly reduced EF 41-49%) -NYHA Class: II (slight limitation of activity) -AHA HF Stage: C (Heart disease and symptoms present) -Current treatment: Furosemide '20mg'$  Appropriate, Effective, Safe, Accessible -Medications previously tried: none noted  -Current home BP/HR readings: controlled at this time -Current home daily weights: monitoring, has been steady -Educated on Benefits of medications for managing symptoms and prolonging life Importance of weighing daily; if you gain more than 3 pounds in one day or 5 pounds in one week, contact providers -Recommended to continue current medication -Monitor fluid status, call me with drastic changes.  Watch salt intake!    Beverly Milch, PharmD, CPP Clinical Pharmacist Practitioner [place] [phone]

## 2023-01-24 ENCOUNTER — Other Ambulatory Visit (HOSPITAL_COMMUNITY): Payer: Self-pay | Admitting: Nurse Practitioner

## 2023-01-25 ENCOUNTER — Telehealth: Payer: Self-pay

## 2023-01-25 ENCOUNTER — Other Ambulatory Visit: Payer: Self-pay | Admitting: Family Medicine

## 2023-01-25 MED ORDER — ALPRAZOLAM 0.5 MG PO TABS: 0.5000 mg | ORAL_TABLET | Freq: Three times a day (TID) | ORAL | 0 refills | Status: DC | PRN

## 2023-01-25 MED ORDER — CLOBETASOL PROPIONATE 0.05 % EX CREA: TOPICAL_CREAM | CUTANEOUS | 3 refills | Status: DC

## 2023-01-25 NOTE — Telephone Encounter (Signed)
Pt called requesting refill on Clobetasol, last RF 03/03/2019 and Xanax last RF 10/16/2022. Last OV 06/26/2022. Thank you.

## 2023-02-01 ENCOUNTER — Other Ambulatory Visit (HOSPITAL_COMMUNITY): Payer: Self-pay | Admitting: Nurse Practitioner

## 2023-02-16 ENCOUNTER — Other Ambulatory Visit: Payer: Medicare Other

## 2023-02-22 ENCOUNTER — Other Ambulatory Visit: Payer: Self-pay | Admitting: Family Medicine

## 2023-02-22 NOTE — Telephone Encounter (Signed)
Requested medication (s) are due for refill today: yes  Requested medication (s) are on the active medication list: yes    Last refill: 09/29/22   #100  2 refills  Future visit scheduled yes 03/27/23  Notes to clinic:Historical provider, please review. Thank you.  Requested Prescriptions  Pending Prescriptions Disp Refills   losartan (COZAAR) 50 MG tablet [Pharmacy Med Name: Losartan Potassium 50 MG Oral Tablet] 100 tablet 2    Sig: TAKE 1 TABLET BY MOUTH ONCE  DAILY     Cardiovascular:  Angiotensin Receptor Blockers Failed - 02/22/2023  5:23 AM      Failed - Cr in normal range and within 180 days    Creat  Date Value Ref Range Status  02/09/2022 1.40 (H) 0.60 - 1.00 mg/dL Final   Creatinine, Ser  Date Value Ref Range Status  09/07/2022 1.19 (H) 0.57 - 1.00 mg/dL Final         Failed - Valid encounter within last 6 months    Recent Outpatient Visits           1 year ago Atrial fibrillation, unspecified type ([place])   [place] Pickard, Cammie Mcgee, MD   1 year ago Persistent atrial fibrillation ([place])   [place] Pickard, Cammie Mcgee, MD   1 year ago Atrial fibrillation, unspecified type [facility])   [place] Pickard, Cammie Mcgee, MD   3 years ago Bloating   [place], Cammie Mcgee, MD   4 years ago Benign essential HTN   [place], Warren T, MD       Future Appointments             In 1 month Pickard, Cammie Mcgee, MD [facility]            Passed - K in normal range and within 180 days    Potassium  Date Value Ref Range Status  09/07/2022 4.8 3.5 - 5.2 mmol/L Final         Passed - Patient is not pregnant      Passed - Last BP in normal range    BP Readings from Last 1 Encounters:  10/24/22 110/80

## 2023-03-01 ENCOUNTER — Encounter: Payer: Medicare Other | Admitting: Family Medicine

## 2023-03-02 IMAGING — DX DG CHEST 1V PORT
1 series · 1 of 1 positions shown · non-contrast
Comparison: None.

CLINICAL DATA: Atrial fibrillation

EXAM:
PORTABLE CHEST 1 VIEW

[chest ap]
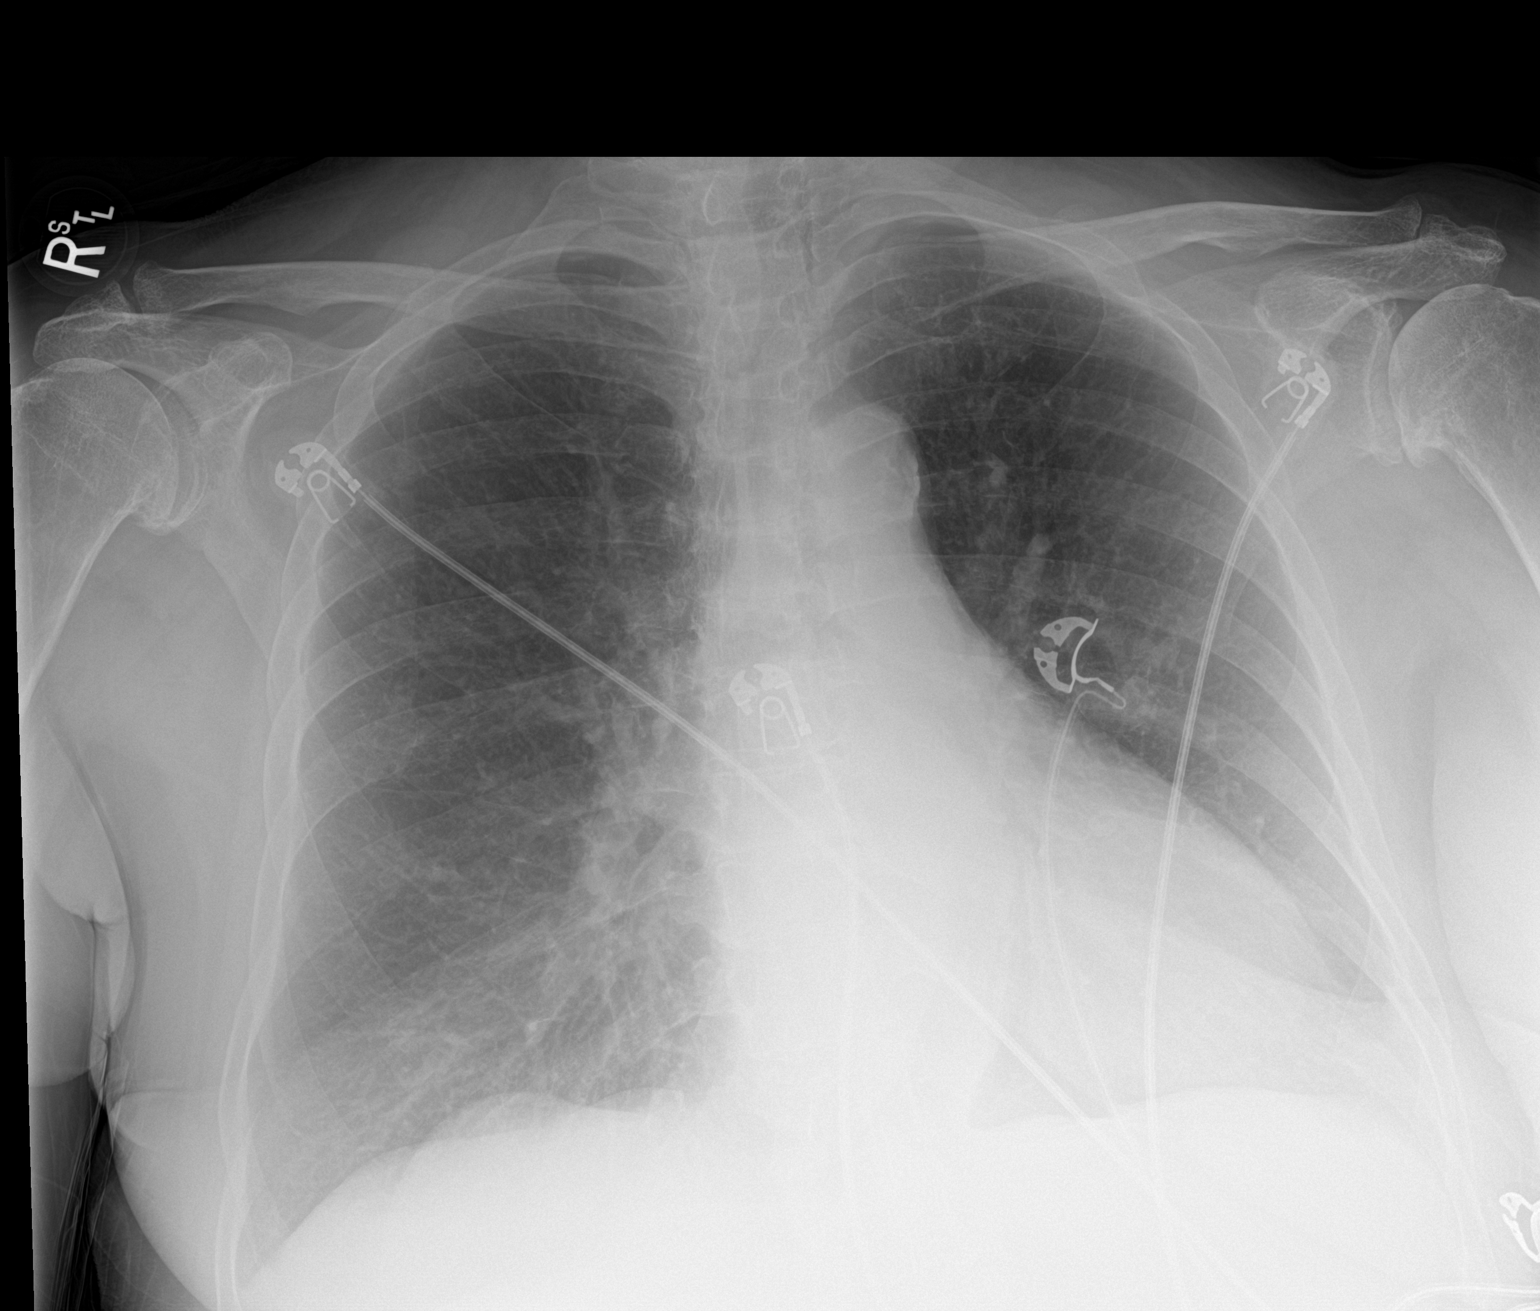

[1 of 1 positions shown; findings below may reference images not displayed]

FINDINGS: The heart size and mediastinal contours are within normal limits.
Both lungs are clear. The visualized skeletal structures are
unremarkable.
IMPRESSION: No active disease.

## 2023-03-14 ENCOUNTER — Telehealth: Payer: Self-pay | Admitting: Pharmacist

## 2023-03-14 NOTE — Progress Notes (Unsigned)
Care Management & Coordination Services Pharmacy Team  Reason for Encounter: General adherence update   Contacted patient for general health update and medication adherence call.  {US HC Outreach:28874}   What concerns do you have about your medications?  The patient {denies/reports:25180} side effects with their medications.   How often do you forget or accidentally miss a dose? {missed doses:25554}  Do you use a pillbox? {yes/no:20286}  Are you having any problems getting your medications from your pharmacy? {yes/no:20286}  Has the cost of your medications been a concern? {yes/no:20286} If yes, what medication and is patient assistance available or has it been applied for?  Since last visit with PharmD, {no/thefollowing:25210} interventions have been made.   The patient {has/has not:25209} had an ED visit since last contact.   The patient {denies/reports:25180} problems with their health.   Patient {denies/reports:25180} concerns or questions for ***, PharmD at this time.   Counseled patient on: {GENERALCOUNSELING:28686}   Chart Updates:  Recent office visits:  None since last PharmD visit  Recent consult visits:  None since last PharmD visit  Hospital visits:  None since last PharmD visit  Medications: Outpatient Encounter Medications as of 03/14/2023  Medication Sig   acetaminophen (TYLENOL) 500 MG tablet Take 500 mg by mouth every 6 (six) hours as needed (pain.).   ALPRAZolam (XANAX) 0.5 MG tablet Take 1 tablet (0.5 mg total) by mouth 3 (three) times daily as needed. for anxiety   amiodarone (PACERONE) 200 MG tablet TAKE 1 TABLET BY MOUTH DAILY   apixaban (ELIQUIS) 5 MG TABS tablet Take 1 tablet (5 mg total) by mouth 2 (two) times daily.   clobetasol cream (TEMOVATE) 0.05 % APPLY ONE APPLICATION TOPICALLY TWO TIMES DAILY   furosemide (LASIX) 40 MG tablet Take 1 tablet by mouth once daily   losartan (COZAAR) 50 MG tablet TAKE 1 TABLET BY MOUTH ONCE  DAILY    ondansetron (ZOFRAN) 4 MG tablet Take 1 tablet (4 mg total) by mouth every 6 (six) hours as needed for nausea.   pantoprazole (PROTONIX) 40 MG tablet TAKE 1 TABLET BY MOUTH DAILY   PARoxetine (PAXIL) 20 MG tablet TAKE 1 TABLET BY MOUTH  TWICE DAILY   polyethylene glycol (MIRALAX / GLYCOLAX) 17 g packet Take 17 g by mouth daily. (Patient taking differently: Take 17 g by mouth daily as needed for moderate constipation.)   potassium chloride SA (KLOR-CON M) 20 MEQ tablet Take 1 tablet by mouth once daily   No facility-administered encounter medications on file as of 03/14/2023.    Recent vitals BP Readings from Last 3 Encounters:  10/24/22 110/80  09/29/22 102/67  06/05/22 100/64   Pulse Readings from Last 3 Encounters:  10/24/22 (!) 48  09/29/22 (!) 59  06/05/22 (!) 104   Wt Readings from Last 3 Encounters:  10/24/22 165 lb 9.6 oz (75.1 kg)  09/28/22 160 lb (72.6 kg)  06/05/22 161 lb 12.8 oz (73.4 kg)   BMI Readings from Last 3 Encounters:  10/24/22 30.29 kg/m  09/28/22 29.26 kg/m  06/05/22 29.59 kg/m    Recent lab results    Component Value Date/Time   NA 139 03/21/2023 0811   NA 143 09/07/2022 1112   K 4.1 03/21/2023 0811   CL 100 03/21/2023 0811   CO2 29 03/21/2023 0811   GLUCOSE 82 03/21/2023 0811   BUN 20 03/21/2023 0811   BUN 24 09/07/2022 1112   CREATININE 1.27 (H) 03/21/2023 0811   CALCIUM 9.7 03/21/2023 0811    Lab  Results  Component Value Date   CREATININE 1.27 (H) 03/21/2023   GFR 84.18 08/14/2012   EGFR 43 (L) 03/21/2023   GFRNONAA 46 (L) 03/28/2022   GFRAA 69 04/12/2018   Lab Results  Component Value Date/Time   HGBA1C 5.3 12/19/2006 08:14 AM    Lab Results  Component Value Date   CHOL 213 (H) 03/21/2023   HDL 71 03/21/2023   LDLCALC 123 (H) 03/21/2023   LDLDIRECT 127.0 08/14/2012   TRIG 90 03/21/2023   CHOLHDL 3.0 03/21/2023    Care Gaps: Annual wellness visit in last year? Yes  Star Rating Drugs:  Losartan 50 mg last filled  02/26/2023 100 DS   Future Appointments  Date Time Provider Department Center  03/27/2023  9:30 AM Donita Brooks, MD [facility]  03/29/2023  3:45 PM [facility]-ANNUAL WELLNESS VISIT [facility]  05/24/2023  2:00 PM Erroll Luna, [facility] None   April D Calhoun, [facility] Clinical Pharmacist Assistant [phone]

## 2023-03-21 ENCOUNTER — Other Ambulatory Visit: Payer: Medicare Other

## 2023-03-21 DIAGNOSIS — I4891 Unspecified atrial fibrillation: Secondary | ICD-10-CM | POA: Diagnosis not present

## 2023-03-21 DIAGNOSIS — N179 Acute kidney failure, unspecified: Secondary | ICD-10-CM | POA: Diagnosis not present

## 2023-03-21 DIAGNOSIS — Z13220 Encounter for screening for lipoid disorders: Secondary | ICD-10-CM

## 2023-03-21 DIAGNOSIS — R946 Abnormal results of thyroid function studies: Secondary | ICD-10-CM

## 2023-03-21 DIAGNOSIS — I10 Essential (primary) hypertension: Secondary | ICD-10-CM

## 2023-03-22 LAB — CBC WITH DIFFERENTIAL/PLATELET
Absolute Monocytes: 577 cells/uL (ref 200–950)
Basophils Absolute: 89 cells/uL (ref 0–200)
Basophils Relative: 1.2 %
Eosinophils Absolute: 141 cells/uL (ref 15–500)
Eosinophils Relative: 1.9 %
HCT: 43.7 % (ref 35.0–45.0)
Hemoglobin: 14.6 g/dL (ref 11.7–15.5)
Lymphs Abs: 1806 cells/uL (ref 850–3900)
MCH: 28.2 pg (ref 27.0–33.0)
MCHC: 33.4 g/dL (ref 32.0–36.0)
MCV: 84.5 fL (ref 80.0–100.0)
MPV: 11.9 fL (ref 7.5–12.5)
Monocytes Relative: 7.8 %
Neutro Abs: 4788 cells/uL (ref 1500–7800)
Neutrophils Relative %: 64.7 %
Platelets: 213 10*3/uL (ref 140–400)
RBC: 5.17 10*6/uL — ABNORMAL HIGH (ref 3.80–5.10)
RDW: 12.5 % (ref 11.0–15.0)
Total Lymphocyte: 24.4 %
WBC: 7.4 10*3/uL (ref 3.8–10.8)

## 2023-03-22 LAB — COMPLETE METABOLIC PANEL WITH GFR
AG Ratio: 1.6 (calc) (ref 1.0–2.5)
ALT: 17 U/L (ref 6–29)
AST: 23 U/L (ref 10–35)
Albumin: 3.9 g/dL (ref 3.6–5.1)
Alkaline phosphatase (APISO): 102 U/L (ref 37–153)
BUN/Creatinine Ratio: 16 (calc) (ref 6–22)
BUN: 20 mg/dL (ref 7–25)
CO2: 29 mmol/L (ref 20–32)
Calcium: 9.7 mg/dL (ref 8.6–10.4)
Chloride: 100 mmol/L (ref 98–110)
Creat: 1.27 mg/dL — ABNORMAL HIGH (ref 0.60–1.00)
Globulin: 2.5 g/dL (calc) (ref 1.9–3.7)
Glucose, Bld: 82 mg/dL (ref 65–99)
Potassium: 4.1 mmol/L (ref 3.5–5.3)
Sodium: 139 mmol/L (ref 135–146)
Total Bilirubin: 0.3 mg/dL (ref 0.2–1.2)
Total Protein: 6.4 g/dL (ref 6.1–8.1)
eGFR: 43 mL/min/{1.73_m2} — ABNORMAL LOW (ref >=60)

## 2023-03-22 LAB — LIPID PANEL
Cholesterol: 213 mg/dL — ABNORMAL HIGH (ref <200)
HDL: 71 mg/dL (ref >=50)
LDL Cholesterol (Calc): 123 mg/dL (calc) — ABNORMAL HIGH
Non-HDL Cholesterol (Calc): 142 mg/dL (calc) — ABNORMAL HIGH (ref <130)
Total CHOL/HDL Ratio: 3 (calc) (ref <5.0)
Triglycerides: 90 mg/dL (ref <150)

## 2023-03-22 LAB — TSH: TSH: 2.08 mIU/L (ref 0.40–4.50)

## 2023-03-27 ENCOUNTER — Ambulatory Visit (INDEPENDENT_AMBULATORY_CARE_PROVIDER_SITE_OTHER): Payer: Medicare Other | Admitting: Family Medicine

## 2023-03-27 VITALS — BP 130/82 | HR 72 | Temp 99.3°F | Ht 62.0 in | Wt 166.4 lb

## 2023-03-27 DIAGNOSIS — Z0001 Encounter for general adult medical examination with abnormal findings: Secondary | ICD-10-CM

## 2023-03-27 DIAGNOSIS — R946 Abnormal results of thyroid function studies: Secondary | ICD-10-CM

## 2023-03-27 DIAGNOSIS — L989 Disorder of the skin and subcutaneous tissue, unspecified: Secondary | ICD-10-CM | POA: Diagnosis not present

## 2023-03-27 DIAGNOSIS — M8580 Other specified disorders of bone density and structure, unspecified site: Secondary | ICD-10-CM

## 2023-03-27 DIAGNOSIS — I4891 Unspecified atrial fibrillation: Secondary | ICD-10-CM | POA: Diagnosis not present

## 2023-03-27 DIAGNOSIS — R300 Dysuria: Secondary | ICD-10-CM | POA: Diagnosis not present

## 2023-03-27 DIAGNOSIS — Z0000 Encounter for general adult medical examination without abnormal findings: Secondary | ICD-10-CM

## 2023-03-27 LAB — URINALYSIS, ROUTINE W REFLEX MICROSCOPIC
Glucose, UA: NEGATIVE
Hgb urine dipstick: NEGATIVE
Hyaline Cast: NONE SEEN /LPF
Ketones, ur: NEGATIVE
Nitrite: POSITIVE — AB
Protein, ur: NEGATIVE
Specific Gravity, Urine: 1.02 (ref 1.001–1.035)
pH: 5 (ref 5.0–8.0)

## 2023-03-27 LAB — MICROSCOPIC MESSAGE

## 2023-03-27 MED ORDER — SULFAMETHOXAZOLE-TRIMETHOPRIM 800-160 MG PO TABS: 1.0000 | ORAL_TABLET | Freq: Two times a day (BID) | ORAL | 0 refills | Status: DC

## 2023-03-27 NOTE — Progress Notes (Signed)
Subjective:    Patient ID: Kristina Dougherty, female    DOB: 04/03/1946, 77 y.o.   MRN: [id number] Patient is a 77 year old Caucasian female here today for complete physical exam.  Since I last saw the patient she had an ablation due to her history of atrial fibrillation.  She is currently in normal sinus rhythm and doing well.  She denies any chest pain shortness of breath or dyspnea on exertion as colonoscopy was 3 years ago and is up-to-date.  Due to her age she does not require Pap smear.  She has not had a mammogram in several years.  I recommended a mammogram but she declines this.  She states that she does not want to do anymore mammograms.  She is due for a bone density test.  Her last bone density test was 7 years ago.  She would like me to schedule this because she had osteopenia at that time.  She also has dysuria and urgency.  The urinalysis today shows +1 leukocyte esterase with positive nitrates suggesting a urinary tract infection.  Her most recent lab work is listed below Lab on 03/21/2023  Component Date Value Ref Range Status   WBC 03/21/2023 7.4  3.8 - 10.8 Thousand/uL Final   RBC 03/21/2023 5.17 (H)  3.80 - 5.10 Million/uL Final   Hemoglobin 03/21/2023 14.6  11.7 - 15.5 g/dL Final   HCT 29/56/2130 43.7  35.0 - 45.0 % Final   MCV 03/21/2023 84.5  80.0 - 100.0 fL Final   MCH 03/21/2023 28.2  27.0 - 33.0 pg Final   MCHC 03/21/2023 33.4  32.0 - 36.0 g/dL Final   RDW 86/57/8469 12.5  11.0 - 15.0 % Final   Platelets 03/21/2023 213  140 - 400 Thousand/uL Final   MPV 03/21/2023 11.9  7.5 - 12.5 fL Final   Neutro Abs 03/21/2023 4,788  1,500 - 7,800 cells/uL Final   Lymphs Abs 03/21/2023 1,806  850 - 3,900 cells/uL Final   Absolute Monocytes 03/21/2023 577  200 - 950 cells/uL Final   Eosinophils Absolute 03/21/2023 141  15 - 500 cells/uL Final   Basophils Absolute 03/21/2023 89  0 - 200 cells/uL Final   Neutrophils Relative % 03/21/2023 64.7  % Final   Total Lymphocyte 03/21/2023  24.4  % Final   Monocytes Relative 03/21/2023 7.8  % Final   Eosinophils Relative 03/21/2023 1.9  % Final   Basophils Relative 03/21/2023 1.2  % Final   Glucose, Bld 03/21/2023 82  65 - 99 mg/dL Final   Comment: .            Fasting reference interval .    BUN 03/21/2023 20  7 - 25 mg/dL Final   Creat 62/95/2841 1.27 (H)  0.60 - 1.00 mg/dL Final   eGFR 32/44/0102 43 (L)  > OR = 60 mL/min/1.28m2 Final   BUN/Creatinine Ratio 03/21/2023 16  6 - 22 (calc) Final   Sodium 03/21/2023 139  135 - 146 mmol/L Final   Potassium 03/21/2023 4.1  3.5 - 5.3 mmol/L Final   Chloride 03/21/2023 100  98 - 110 mmol/L Final   CO2 03/21/2023 29  20 - 32 mmol/L Final   Calcium 03/21/2023 9.7  8.6 - 10.4 mg/dL Final   Total Protein 72/53/6644 6.4  6.1 - 8.1 g/dL Final   Albumin 03/47/4259 3.9  3.6 - 5.1 g/dL Final   Globulin 56/38/7564 2.5  1.9 - 3.7 g/dL (calc) Final   AG Ratio 03/21/2023 1.6  1.0 -  2.5 (calc) Final   Total Bilirubin 03/21/2023 0.3  0.2 - 1.2 mg/dL Final   Alkaline phosphatase (APISO) 03/21/2023 102  37 - 153 U/L Final   AST 03/21/2023 23  10 - 35 U/L Final   ALT 03/21/2023 17  6 - 29 U/L Final   Cholesterol 03/21/2023 213 (H)  <200 mg/dL Final   HDL 78/29/5621 71  > OR = 50 mg/dL Final   Triglycerides 30/86/5784 90  <150 mg/dL Final   LDL Cholesterol (Calc) 03/21/2023 123 (H)  mg/dL (calc) Final   Comment: Reference range: <100 . Desirable range <100 mg/dL for primary prevention;   <70 mg/dL for patients with CHD or diabetic patients  with > or = 2 CHD risk factors. Marland Kitchen LDL-C is now calculated using the Martin-Hopkins  calculation, which is a validated novel method providing  better accuracy than the Friedewald equation in the  estimation of LDL-C.  Horald Pollen et al. Lenox Ahr. 6962;952(84): 2061-2068  (http://education.[facility].com/faq/FAQ164)    Total CHOL/HDL Ratio 03/21/2023 3.0  <1.3 (calc) Final   Non-HDL Cholesterol (Calc) 03/21/2023 142 (H)  <130 mg/dL (calc) Final    Comment: For patients with diabetes plus 1 major ASCVD risk  factor, treating to a non-HDL-C goal of <100 mg/dL  (LDL-C of <24 mg/dL) is considered a therapeutic  option.    TSH 03/21/2023 2.08  0.40 - 4.50 mIU/L Final    Past Medical History:  Diagnosis Date   Anxiety    Arthritis    Atrial fibrillation (HCC)    Deaf, right    Depression    Hypertension    Osteopenia    Past Surgical History:  Procedure Laterality Date   ABDOMINAL HYSTERECTOMY     APPENDECTOMY     ATRIAL FIBRILLATION ABLATION N/A 09/28/2022   Procedure: ATRIAL FIBRILLATION ABLATION;  Surgeon: Regan Lemming, MD;  Location: [facility] INVASIVE CV LAB;  Service: Cardiovascular;  Laterality: N/A;   BUBBLE STUDY  01/09/2022   Procedure: BUBBLE STUDY;  Surgeon: Chilton Si, MD;  Location: [facility] ENDOSCOPY;  Service: Cardiovascular;;   CARDIOVERSION N/A 01/09/2022   Procedure: CARDIOVERSION;  Surgeon: Chilton Si, MD;  Location: [facility] ENDOSCOPY;  Service: Cardiovascular;  Laterality: N/A;   CARDIOVERSION N/A 01/20/2022   Procedure: CARDIOVERSION;  Surgeon: Jake Bathe, MD;  Location: [facility] ENDOSCOPY;  Service: Cardiovascular;  Laterality: N/A;   CARDIOVERSION N/A 04/05/2022   Procedure: CARDIOVERSION;  Surgeon: Jake Bathe, MD;  Location: [facility] ENDOSCOPY;  Service: Cardiovascular;  Laterality: N/A;   TEE WITHOUT CARDIOVERSION N/A 01/09/2022   Procedure: TRANSESOPHAGEAL ECHOCARDIOGRAM (TEE);  Surgeon: Chilton Si, MD;  Location[facility] ENDOSCOPY;  Service: Cardiovascular;  Laterality: N/A;   TEE WITHOUT CARDIOVERSION N/A 01/20/2022   Procedure: TRANSESOPHAGEAL ECHOCARDIOGRAM (TEE);  Surgeon: Jake Bathe, MD;  Location: [facility] ENDOSCOPY;  Service: Cardiovascular;  Laterality: N/A;   Current Outpatient Medications on File Prior to Visit  Medication Sig Dispense Refill   acetaminophen (TYLENOL) 500 MG tablet Take 500 mg by mouth every 6 (six) hours as needed (pain.).     ALPRAZolam (XANAX) 0.5 MG tablet Take 1 tablet (0.5  mg total) by mouth 3 (three) times daily as needed. for anxiety 30 tablet 0   amiodarone (PACERONE) 200 MG tablet TAKE 1 TABLET BY MOUTH DAILY 100 tablet 2   apixaban (ELIQUIS) 5 MG TABS tablet Take 1 tablet (5 mg total) by mouth 2 (two) times daily. 180 tablet 2   clobetasol cream (TEMOVATE) 0.05 % APPLY ONE APPLICATION TOPICALLY TWO TIMES DAILY 30  g 3   furosemide (LASIX) 40 MG tablet Take 1 tablet by mouth once daily 30 tablet 4   losartan (COZAAR) 50 MG tablet TAKE 1 TABLET BY MOUTH ONCE  DAILY 100 tablet 2   ondansetron (ZOFRAN) 4 MG tablet Take 1 tablet (4 mg total) by mouth every 6 (six) hours as needed for nausea. 20 tablet 0   pantoprazole (PROTONIX) 40 MG tablet TAKE 1 TABLET BY MOUTH DAILY 30 tablet 0   PARoxetine (PAXIL) 20 MG tablet TAKE 1 TABLET BY MOUTH  TWICE DAILY 180 tablet 2   polyethylene glycol (MIRALAX / GLYCOLAX) 17 g packet Take 17 g by mouth daily. (Patient taking differently: Take 17 g by mouth daily as needed for moderate constipation.) 14 each 0   potassium chloride SA (KLOR-CON M) 20 MEQ tablet Take 1 tablet by mouth once daily 30 tablet 5   No current facility-administered medications on file prior to visit.   Allergies  Allergen Reactions   Morphine And Related     Severe HA   Nitrofurantoin Nausea And Vomiting   Social History   Socioeconomic History   Marital status: Divorced    Spouse name: Not on file   Number of children: Not on file   Years of education: Not on file   Highest education level: Not on file  Occupational History   Not on file  Tobacco Use   Smoking status: Former    Packs/day: .3    Types: Cigarettes    Quit date: 08/27/2021    Years since quitting: 1.5   Smokeless tobacco: Never   Tobacco comments:    Former smoker 10/24/22.  Substance and Sexual Activity   Alcohol use: No   Drug use: No   Sexual activity: Never  Other Topics Concern   Not on file  Social History Narrative   Not on file   Social Determinants of Health    Financial Resource Strain: Low Risk  (01/18/2023)   Overall Financial Resource Strain (CARDIA)    Difficulty of Paying Living Expenses: Not very hard  Food Insecurity: No Food Insecurity (01/18/2023)   Hunger Vital Sign    Worried About Running Out of Food in the Last Year: Never true    Ran Out of Food in the Last Year: Never true  Transportation Needs: No Transportation Needs (09/29/2022)   PRAPARE - Administrator, Civil Service (Medical): No    Lack of Transportation (Non-Medical): No  Physical Activity: Sufficiently Active (03/23/2022)   Exercise Vital Sign    Days of Exercise per Week: 4 days    Minutes of Exercise per Session: 40 min  Stress: No Stress Concern Present (03/23/2022)   [employer] of Occupational Health - Occupational Stress Questionnaire    Feeling of Stress : Not at all  Social Connections: Moderately Isolated (03/23/2022)   Social Connection and Isolation Panel [NHANES]    Frequency of Communication with Friends and Family: More than three times a week    Frequency of Social Gatherings with Friends and Family: More than three times a week    Attends Religious Services: 1 to 4 times per year    Active Member of [employer] or Organizations: No    Attends Banker Meetings: Never    Marital Status: Divorced  Catering manager Violence: Not At Risk (09/29/2022)   Humiliation, Afraid, Rape, and Kick questionnaire    Fear of Current or Ex-Partner: No    Emotionally Abused: No  Physically Abused: No    Sexually Abused: No     Review of Systems  Gastrointestinal:  Positive for constipation.  All other systems reviewed and are negative.      Objective:   Physical Exam Vitals reviewed.  Constitutional:      General: She is not in acute distress.    Appearance: Normal appearance. She is normal weight. She is not ill-appearing or toxic-appearing.  HENT:     Right Ear: There is impacted cerumen.     Left Ear: There is impacted cerumen.      Nose: Nose normal. No congestion or rhinorrhea.      Mouth/Throat:     Mouth: Mucous membranes are moist.     Pharynx: Oropharynx is clear. No oropharyngeal exudate or posterior oropharyngeal erythema.  Eyes:     Extraocular Movements: Extraocular movements intact.     Pupils: Pupils are equal, round, and reactive to light.  Neck:     Vascular: No carotid bruit.  Cardiovascular:     Rate and Rhythm: Normal rate. Rhythm irregular.     Pulses: Normal pulses.     Heart sounds: Normal heart sounds. No murmur heard.    No friction rub. No gallop.  Pulmonary:     Effort: Pulmonary effort is normal. No tachypnea, accessory muscle usage or respiratory distress.     Breath sounds: Normal breath sounds. No stridor. No wheezing, rhonchi or rales.  Abdominal:     General: Abdomen is flat. Bowel sounds are normal. There is no distension.     Palpations: Abdomen is soft.     Tenderness: There is no abdominal tenderness. There is no guarding or rebound.  Musculoskeletal:     Right lower leg: No edema.     Left lower leg: No edema.  Skin:    Findings: Lesion present.  Neurological:     General: No focal deficit present.     Mental Status: She is alert and oriented to person, place, and time. Mental status is at baseline.     Cranial Nerves: No cranial nerve deficit.     Motor: No weakness.     Coordination: Coordination normal.     Gait: Gait normal.  Psychiatric:        Mood and Affect: Mood normal.        Behavior: Behavior normal.        Thought Content: Thought content normal.        Judgment: Judgment normal.           Assessment & Plan:  Dysuria - Plan: Urinalysis, Routine w reflex microscopic  Encounter for Medicare annual wellness exam - Plan: CBC with Differential/Platelet, COMPLETE METABOLIC PANEL WITH GFR, Lipid panel, TSH  Atrial fibrillation, unspecified type (HCC) - Plan: CBC with Differential/Platelet, COMPLETE METABOLIC PANEL WITH GFR, Lipid panel,  TSH  Abnormal thyroid function test - Plan: CBC with Differential/Platelet, COMPLETE METABOLIC PANEL WITH GFR, Lipid panel, TSH  Osteopenia, unspecified location - Plan: DG Bone Density  Lesion of skin of nose Her CBC looks excellent.  Her CMP shows stage III chronic kidney disease.  I recommended avoiding NSAIDs and drinking plenty of water.  Her urinalysis suggesting urinary tract infection.  I will treat this with Bactrim twice daily for 5 days.  She is due for shingles vaccine which she defers today.  The remainder of her immunizations are up-to-date.  Her colonoscopy is up-to-date.  She does not require Pap smear.  She is due for mammogram but she  refuses this.  She does not need to schedule her for a bone density we discussed a low-fat diet including a diet rich in chicken and fish and fruits and vegetables avoiding fried foods, red meat, and pork.  I am concerned about a lesion on her right nostril.  This is been there for 6 to 8 months.  I will consult dermatology

## 2023-03-29 ENCOUNTER — Telehealth: Payer: Self-pay | Admitting: Family Medicine

## 2023-03-29 NOTE — Progress Notes (Deleted)
Subjective:   Kristina Dougherty is a 77 y.o. female who presents for Medicare Annual (Subsequent) preventive examination.  Review of Systems    ***       Objective:    There were no vitals filed for this visit. There is no height or weight on file to calculate BMI.     09/28/2022   11:18 AM 04/05/2022   10:46 AM 03/23/2022    4:05 PM 01/27/2022   10:19 AM 01/20/2022   10:20 AM 01/09/2022    7:47 AM 12/04/2021    8:01 PM  Advanced Directives  Does Patient Have a Medical Advance Directive? No No No No No No No  Would patient like information on creating a medical advance directive? Yes (MAU/Ambulatory/Procedural Areas - Information given) No - Patient declined No - Patient declined No - Patient declined No - Patient declined No - Patient declined No - Patient declined    Current Medications (verified) Outpatient Encounter Medications as of 03/29/2023  Medication Sig   acetaminophen (TYLENOL) 500 MG tablet Take 500 mg by mouth every 6 (six) hours as needed (pain.).   ALPRAZolam (XANAX) 0.5 MG tablet Take 1 tablet (0.5 mg total) by mouth 3 (three) times daily as needed. for anxiety   amiodarone (PACERONE) 200 MG tablet TAKE 1 TABLET BY MOUTH DAILY   apixaban (ELIQUIS) 5 MG TABS tablet Take 1 tablet (5 mg total) by mouth 2 (two) times daily.   clobetasol cream (TEMOVATE) 0.05 % APPLY ONE APPLICATION TOPICALLY TWO TIMES DAILY   furosemide (LASIX) 40 MG tablet Take 1 tablet by mouth once daily   losartan (COZAAR) 50 MG tablet TAKE 1 TABLET BY MOUTH ONCE  DAILY   ondansetron (ZOFRAN) 4 MG tablet Take 1 tablet (4 mg total) by mouth every 6 (six) hours as needed for nausea.   pantoprazole (PROTONIX) 40 MG tablet TAKE 1 TABLET BY MOUTH DAILY   PARoxetine (PAXIL) 20 MG tablet TAKE 1 TABLET BY MOUTH  TWICE DAILY   polyethylene glycol (MIRALAX / GLYCOLAX) 17 g packet Take 17 g by mouth daily. (Patient taking differently: Take 17 g by mouth daily as needed for moderate constipation.)   potassium  chloride SA (KLOR-CON M) 20 MEQ tablet Take 1 tablet by mouth once daily   sulfamethoxazole-trimethoprim (BACTRIM DS) 800-160 MG tablet Take 1 tablet by mouth 2 (two) times daily.   No facility-administered encounter medications on file as of 03/29/2023.    Allergies (verified) Morphine and related and Nitrofurantoin   History: Past Medical History:  Diagnosis Date   Anxiety    Arthritis    Atrial fibrillation (HCC)    Deaf, right    Depression    Hypertension    Osteopenia    Past Surgical History:  Procedure Laterality Date   ABDOMINAL HYSTERECTOMY     APPENDECTOMY     ATRIAL FIBRILLATION ABLATION N/A 09/28/2022   Procedure: ATRIAL FIBRILLATION ABLATION;  Surgeon: Regan Lemming, MD;  Location: [facility] INVASIVE CV LAB;  Service: Cardiovascular;  Laterality: N/A;   BUBBLE STUDY  01/09/2022   Procedure: BUBBLE STUDY;  Surgeon: Chilton Si, MD;  Location: [facility] ENDOSCOPY;  Service: Cardiovascular;;   CARDIOVERSION N/A 01/09/2022   Procedure: CARDIOVERSION;  Surgeon: Chilton Si, MD;  Location: [facility] ENDOSCOPY;  Service: Cardiovascular;  Laterality: N/A;   CARDIOVERSION N/A 01/20/2022   Procedure: CARDIOVERSION;  Surgeon: Jake Bathe, MD;  Location: [facility] ENDOSCOPY;  Service: Cardiovascular;  Laterality: N/A;   CARDIOVERSION N/A 04/05/2022   Procedure: CARDIOVERSION;  Surgeon: Jake Bathe, MD;  Location: [facility] ENDOSCOPY;  Service: Cardiovascular;  Laterality: N/A;   TEE WITHOUT CARDIOVERSION N/A 01/09/2022   Procedure: TRANSESOPHAGEAL ECHOCARDIOGRAM (TEE);  Surgeon: Chilton Si, MD;  Location: [facility] ENDOSCOPY;  Service: Cardiovascular;  Laterality: N/A;   TEE WITHOUT CARDIOVERSION N/A 01/20/2022   Procedure: TRANSESOPHAGEAL ECHOCARDIOGRAM (TEE);  Surgeon: Jake Bathe, MD;  Location: [facility] ENDOSCOPY;  Service: Cardiovascular;  Laterality: N/A;   Family History  Problem Relation Age of Onset   Hypertension Maternal Grandmother    Social History   Socioeconomic History    Marital status: Divorced    Spouse name: Not on file   Number of children: Not on file   Years of education: Not on file   Highest education level: Not on file  Occupational History   Not on file  Tobacco Use   Smoking status: Former    Packs/day: .3    Types: Cigarettes    Quit date: 08/27/2021    Years since quitting: 1.5   Smokeless tobacco: Never   Tobacco comments:    Former smoker 10/24/22.  Substance and Sexual Activity   Alcohol use: No   Drug use: No   Sexual activity: Never  Other Topics Concern   Not on file  Social History Narrative   Not on file   Social Determinants of Health   Financial Resource Strain: Low Risk  (01/18/2023)   Overall Financial Resource Strain (CARDIA)    Difficulty of Paying Living Expenses: Not very hard  Food Insecurity: No Food Insecurity (01/18/2023)   Hunger Vital Sign    Worried About Running Out of Food in the Last Year: Never true    Ran Out of Food in the Last Year: Never true  Transportation Needs: No Transportation Needs (09/29/2022)   PRAPARE - Administrator, Civil Service (Medical): No    Lack of Transportation (Non-Medical): No  Physical Activity: Sufficiently Active (03/23/2022)   Exercise Vital Sign    Days of Exercise per Week: 4 days    Minutes of Exercise per Session: 40 min  Stress: No Stress Concern Present (03/23/2022)   [employer] of Occupational Health - Occupational Stress Questionnaire    Feeling of Stress : Not at all  Social Connections: Moderately Isolated (03/23/2022)   Social Connection and Isolation Panel [NHANES]    Frequency of Communication with Friends and Family: More than three times a week    Frequency of Social Gatherings with Friends and Family: More than three times a week    Attends Religious Services: 1 to 4 times per year    Active Member of [employer] or Organizations: No    Attends Banker Meetings: Never    Marital Status: Divorced    Tobacco  Counseling Counseling given: Not Answered Tobacco comments: Former smoker 10/24/22.   Clinical Intake:                 Diabetic?No          Activities of Daily Living    09/29/2022    8:00 AM  In your present state of health, do you have any difficulty performing the following activities:  Hearing? 1  Vision? 0  Difficulty concentrating or making decisions? 0  Walking or climbing stairs? 0  Dressing or bathing? 0  Doing errands, shopping? 0    Patient Care Team: Donita Brooks, MD as PCP - General (Family Medicine) Regan Lemming, MD as PCP - Electrophysiology (Cardiology)  Indicate  any recent Medical Services you may have received from other than [facility] providers in the past year (date may be approximate).     Assessment:   This is a routine wellness examination for Varshini.  Hearing/Vision screen No results found.  Dietary issues and exercise activities discussed:     Goals Addressed   None    Depression Screen    03/27/2023    9:41 AM 03/23/2022    3:58 PM 01/27/2022   10:17 AM 08/06/2017   10:33 AM 03/17/2016    2:59 PM 03/10/2015   12:08 PM  PHQ 2/9 Scores  PHQ - 2 Score 0 0 0 0 2 0  PHQ- 9 Score     5     Fall Risk    03/27/2023    9:41 AM 03/23/2022    4:05 PM 01/27/2022   10:17 AM 03/10/2015   12:08 PM  Fall Risk   Falls in the past year? 0 1 1 No  Number falls in past yr: 0 0 0   Injury with Fall? 0 0 0   Risk for fall due to : No Fall Risks Impaired balance/gait;History of fall(s)    Follow up Falls prevention discussed Falls prevention discussed      FALL RISK PREVENTION PERTAINING TO THE HOME:  Any stairs in or around the home? {YES/NO:21197} If so, are there any without handrails? {YES/NO:21197} Home free of loose throw rugs in walkways, pet beds, electrical cords, etc? {YES/NO:21197} Adequate lighting in your home to reduce risk of falls? {YES/NO:21197}  ASSISTIVE DEVICES UTILIZED TO PREVENT FALLS:  Life alert?  {YES/NO:21197} Use of a cane, walker or w/c? {YES/NO:21197} Grab bars in the bathroom? {YES/NO:21197} Shower chair or bench in shower? {YES/NO:21197} Elevated toilet seat or a handicapped toilet? {YES/NO:21197}  TIMED UP AND GO:  Was the test performed? No . Telephonic visit   Cognitive Function:        03/23/2022    4:07 PM  6CIT Screen  What Year? 0 points  What month? 0 points  What time? 0 points  Count back from 20 0 points  Months in reverse 0 points  Repeat phrase 0 points  Total Score 0 points    Immunizations Immunization History  Administered Date(s) Administered   Fluad Quad(high Dose 65+) 08/27/2019, 08/31/2021   Influenza Split 08/14/2012   Influenza Whole 11/27/2005   Influenza, High Dose Seasonal PF 09/03/2018   Influenza,inj,Quad PF,6+ Mos 10/12/2015   Influenza-Unspecified 10/11/2016, 08/27/2018   Pneumococcal Conjugate-13 12/04/2013   Pneumococcal Polysaccharide-23 08/14/2012   Zoster, Live 03/24/2016    TDAP status: Due, Education has been provided regarding the importance of this vaccine. Advised may receive this vaccine at local pharmacy or Health Dept. Aware to provide a copy of the vaccination record if obtained from local pharmacy or Health Dept. Verbalized acceptance and understanding.  Flu Vaccine status: Up to date  Pneumococcal vaccine status: Up to date  Covid-19 vaccine status: Information provided on how to obtain vaccines.   Qualifies for Shingles Vaccine? Yes   Zostavax completed No   Shingrix Completed?: No.    Education has been provided regarding the importance of this vaccine. Patient has been advised to call insurance company to determine out of pocket expense if they have not yet received this vaccine. Advised may also receive vaccine at local pharmacy or Health Dept. Verbalized acceptance and understanding.  Screening Tests Health Maintenance  Topic Date Due   COVID-19 Vaccine (1) Never done   Hepatitis C  Screening  Never  done   Zoster Vaccines- Shingrix (1 of 2) Never done   INFLUENZA VACCINE  06/28/2023   Medicare Annual Wellness (AWV)  03/26/2024   Pneumonia Vaccine 77+ Years old  Completed   DEXA SCAN  Completed   HPV VACCINES  Aged Out   DTaP/Tdap/Td  Discontinued    Health Maintenance  Health Maintenance Due  Topic Date Due   COVID-19 Vaccine (1) Never done   Hepatitis C Screening  Never done   Zoster Vaccines- Shingrix (1 of 2) Never done    Colorectal cancer screening: No longer required.   Mammogram status: No longer required due to age.  Bone Density status: Completed 04/11/16. Results reflect: Bone density results: OSTEOPENIA. Repeat every 2 years. (Scheduled for 10/11/23)  Lung Cancer Screening: (Low Dose CT Chest recommended if Age 57-80 years, 30 pack-year currently smoking OR have quit w/in 15years.) does not qualify.   Lung Cancer Screening Referral: n/a  Additional Screening:  Hepatitis C Screening: does qualify;   Vision Screening: Recommended annual ophthalmology exams for early detection of glaucoma and other disorders of the eye. Is the patient up to date with their annual eye exam?  {YES/NO:21197} Who is the provider or what is the name of the office in which the patient attends annual eye exams? *** If pt is not established with a provider, would they like to be referred to a provider to establish care? {YES/NO:21197}.   Dental Screening: Recommended annual dental exams for proper oral hygiene  Community Resource Referral / Chronic Care Management: CRR required this visit?  {YES/NO:21197}  CCM required this visit?  {YES/NO:21197}     Plan:     I have personally reviewed and noted the following in the patient's chart:   Medical and social history Use of alcohol, tobacco or illicit drugs  Current medications and supplements including opioid prescriptions. {Opioid Prescriptions:[phone]} Functional ability and status Nutritional status Physical  activity Advanced directives List of other physicians Hospitalizations, surgeries, and ER visits in previous 12 months Vitals Screenings to include cognitive, depression, and falls Referrals and appointments  In addition, I have reviewed and discussed with patient certain preventive protocols, quality metrics, and best practice recommendations. A written personalized care plan for preventive services as well as general preventive health recommendations were provided to patient.     Durwin Nora, [place]   12/02/1094   Due to this being a virtual visit, the after visit summary with patients personalized plan was offered to patient via mail or my-chart. ***Patient declined at this time./ Patient would like to access on my-chart/ per request, patient was mailed a copy of AVS./ Patient preferred to pick up at office at next visit  Nurse Notes: ***

## 2023-03-29 NOTE — Telephone Encounter (Signed)
Patient called to follow up on recent appt; stated she's taken 5 of the antibiotic pills and is worse off now than she was before she started taking them. Also reported increase in abdominal pain. Patient requesting for provider to change the antibiotic.  Pharmacy confirmed as:  [facility] [address] [place] - [address] [place] [address] Phone: [phone]  Fax: [phone]   Please advise at [phone]

## 2023-03-30 ENCOUNTER — Other Ambulatory Visit: Payer: Self-pay | Admitting: Family Medicine

## 2023-03-30 MED ORDER — CEPHALEXIN 500 MG PO CAPS: 500.0000 mg | ORAL_CAPSULE | Freq: Three times a day (TID) | ORAL | 0 refills | Status: DC

## 2023-04-07 IMAGING — CT CT ANGIO CHEST-ABD-PELV FOR DISSECTION W/ AND WO/W CM
2 of 7 series · 15 of 46 positions shown, 17 images · IV contrast (OMNI 350)
Comparison: None.

CLINICAL DATA: Chest pain or back pain, aortic dissection suspected
history of a-fib, concern for embolization to abdomen. Abdominal
pain, nausea, vomiting

EXAM:
CT ANGIOGRAPHY CHEST, ABDOMEN AND PELVIS
TECHNIQUE: Non-contrast CT of the chest was initially obtained.

[Series 7: dissection 2mm · axial · 0.88mm/px · z∈[-574,-26]mm · 12 of 310 slices shown, 14 images]
[im 18/310  soft-tissue]
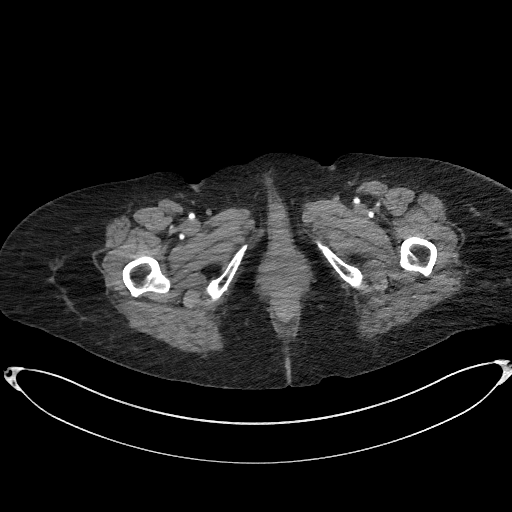
[im 18/310  bone]
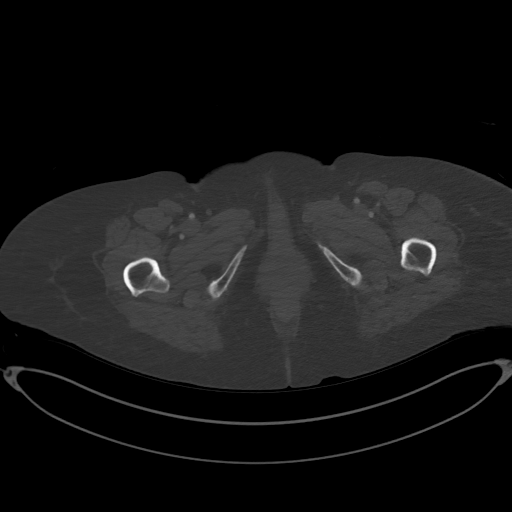
[im 52/310  soft-tissue]
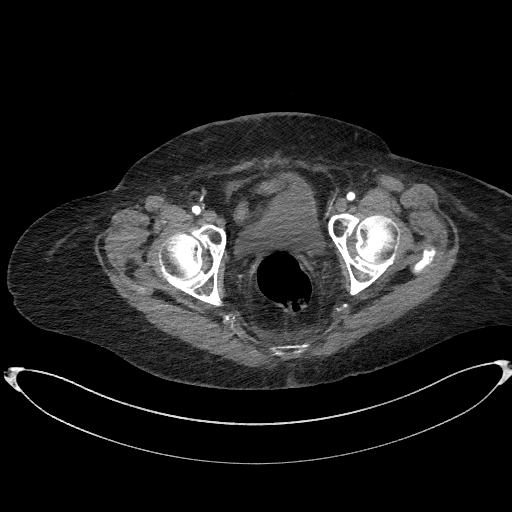
[im 69/310  soft-tissue]
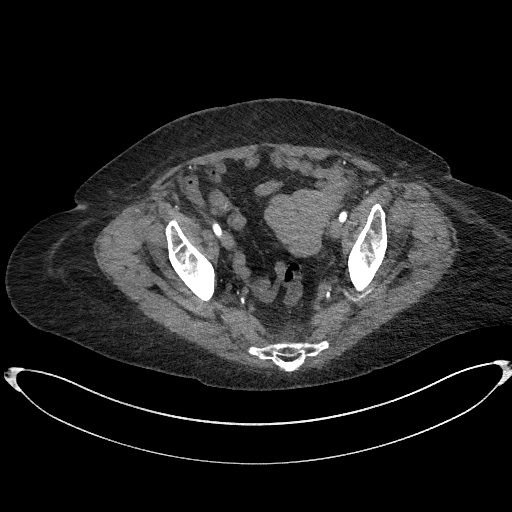
[im 86/310  soft-tissue]
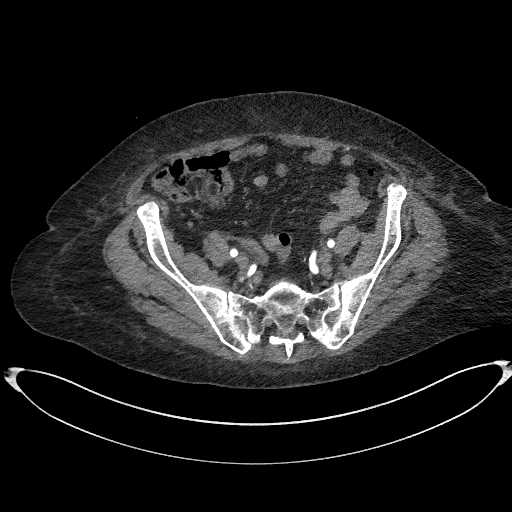
[im 121/310  soft-tissue]
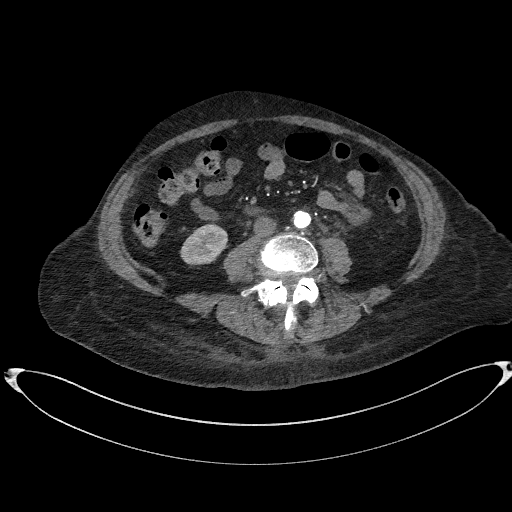
[im 138/310  soft-tissue]
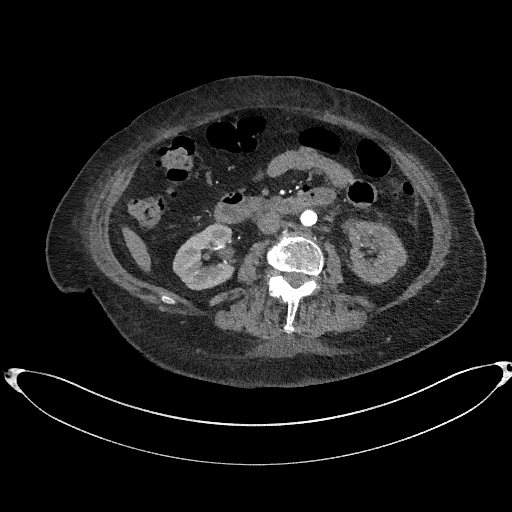
[im 172/310  soft-tissue]
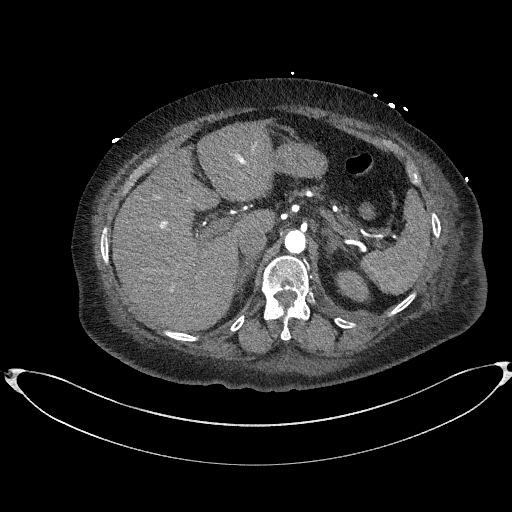
[im 189/310  soft-tissue]
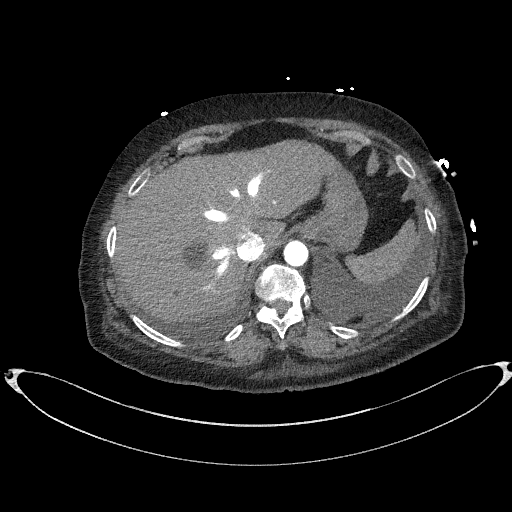
[im 224/310  soft-tissue]
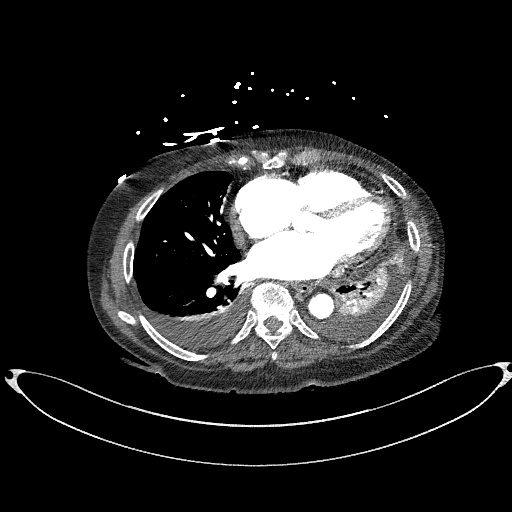
[im 224/310  bone]
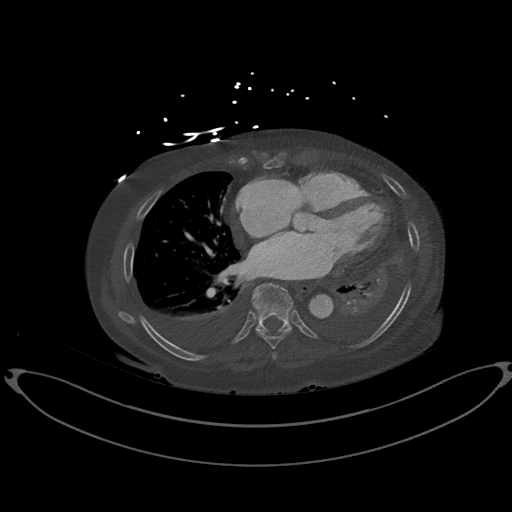
[im 241/310  soft-tissue]
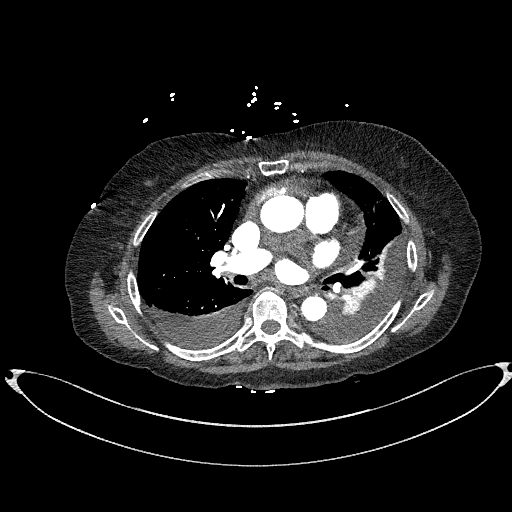
[im 258/310  soft-tissue]
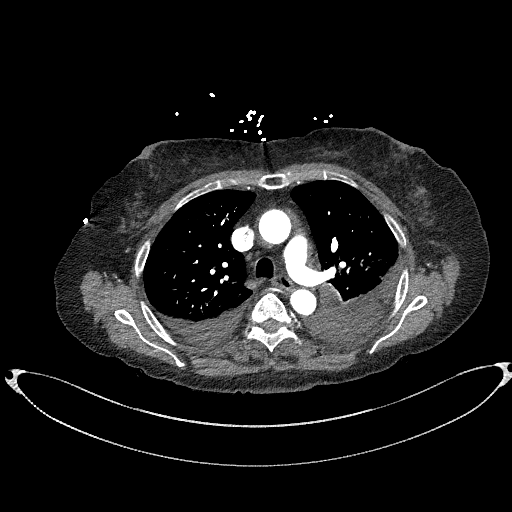
[im 292/310  soft-tissue]
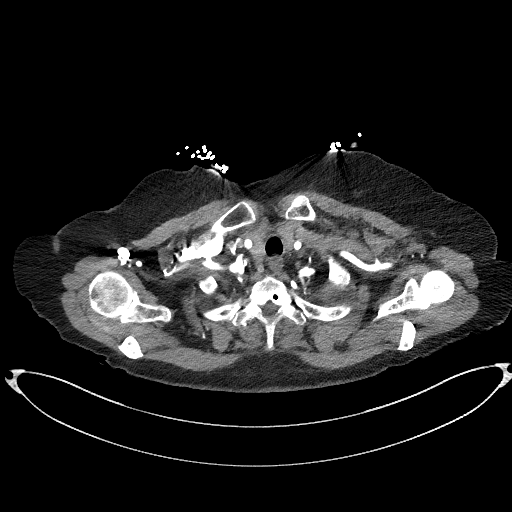

[Series 10: dissection 2mm cor · coronal · 0.93mm/px · 3 of 163 slices shown]
[im 41/163  soft-tissue]
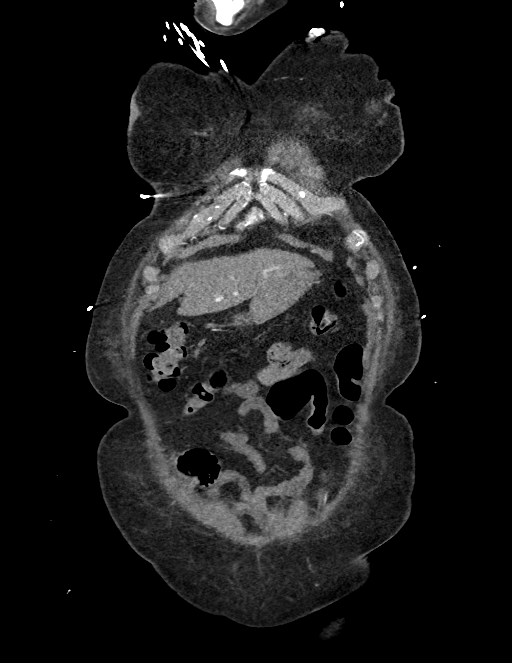
[im 82/163  soft-tissue]
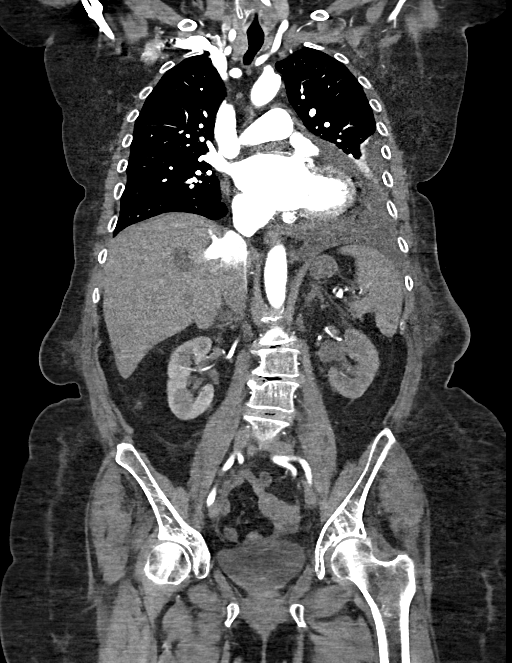
[im 122/163  soft-tissue]
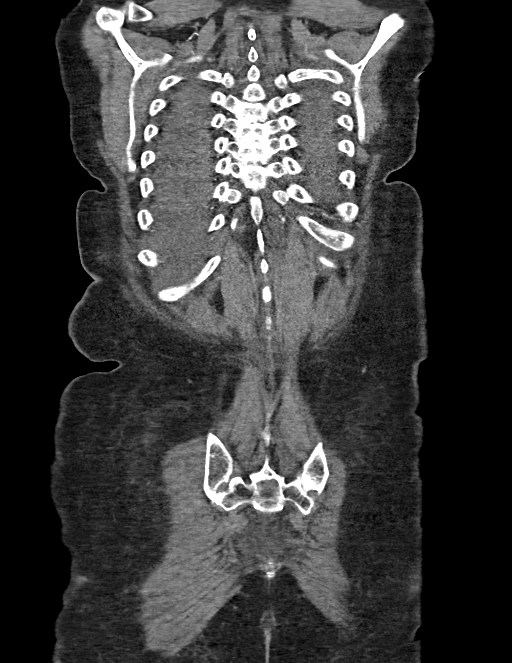

[15 of 46 positions shown; findings below may reference images not displayed]

Multidetector CT imaging through the chest, abdomen and pelvis was
performed using the standard protocol during bolus administration of
intravenous contrast. Multiplanar reconstructed images and MIPs were
obtained and reviewed to evaluate the vascular anatomy.

RADIATION DOSE REDUCTION: This exam was performed according to the
departmental dose-optimization program which includes automated
exposure control, adjustment of the mA and/or kV according to
patient size and/or use of iterative reconstruction technique.

CONTRAST:  100mL OMNIPAQUE IOHEXOL 350 MG/ML SOLN
FINDINGS: CTA CHEST FINDINGS

Cardiovascular: Heart is mildly enlarged. Aorta normal caliber. No
dissection. Scattered aortic calcifications.

Mediastinum/Nodes: No mediastinal, hilar, or axillary adenopathy.
Trachea and esophagus are unremarkable.

Lungs/Pleura: Small to moderate bilateral pleural effusions.
Compressive atelectasis in the lower lobes.

Musculoskeletal: Chest wall soft tissues are unremarkable. No acute
bony abnormality.

Review of the MIP images confirms the above findings.

CTA ABDOMEN AND PELVIS FINDINGS

VASCULAR

Aorta: Normal caliber aorta without aneurysm, dissection, vasculitis
or significant stenosis. Scattered aortic atherosclerosis.

Celiac: Patent without evidence of aneurysm, dissection, vasculitis
or significant stenosis.

SMA: Patent without evidence of aneurysm, dissection, vasculitis or
significant stenosis.

Renals: Both renal arteries are patent without evidence of aneurysm,
dissection, vasculitis, fibromuscular dysplasia or significant
stenosis.

IMA: Patent without evidence of aneurysm, dissection, vasculitis or
significant stenosis.

Inflow: Patent without evidence of aneurysm, dissection, vasculitis
or significant stenosis.

Veins: No obvious venous abnormality within the limitations of this
arterial phase study.

Review of the MIP images confirms the above findings.

NON-VASCULAR

Hepatobiliary: The scattered hypodensities in the liver, likely
cysts, the largest centrally in the right hepatic lobe measuring
cm. Gallbladder unremarkable.

Pancreas: No focal abnormality or ductal dilatation.

Spleen: No focal abnormality.  Normal size.

Adrenals/Urinary Tract: Adrenal glands and kidneys unremarkable. No
hydronephrosis. Urinary bladder unremarkable.

Stomach/Bowel: Stomach, large and small bowel grossly unremarkable.

Lymphatic: No adenopathy

Reproductive: Prior hysterectomy.  No adnexal masses.

Other: Small amount of free fluid in the pelvis.  No free air.

Musculoskeletal: No acute bony abnormality.

Review of the MIP images confirms the above findings.
IMPRESSION: No evidence of aortic aneurysm or dissection.

Small to moderate bilateral pleural effusions with compressive
atelectasis in the lower lobes.

Trace free fluid in the pelvis. Otherwise no acute findings in the
abdomen or pelvis.

Aortic atherosclerosis.

## 2023-04-27 ENCOUNTER — Other Ambulatory Visit: Payer: Self-pay | Admitting: Family Medicine

## 2023-04-27 MED ORDER — APIXABAN 5 MG PO TABS: 5.0000 mg | ORAL_TABLET | Freq: Two times a day (BID) | ORAL | 0 refills | Status: DC

## 2023-04-27 NOTE — Telephone Encounter (Signed)
Prescription Request  04/27/2023  LOV: 03/27/2023  What is the name of the medication or equipment?   apixaban (ELIQUIS) 5 MG TABS tablet **New script requested**  Have you contacted your pharmacy to request a refill? Yes   Which pharmacy would you like this sent to?  [facility] - [place], [place] - [address] [place] [address] Phone: [phone] Fax: [phone]    Patient notified that their request is being sent to the clinical staff for review and that they should receive a response within 2 business days.   Please advise pharmacist.

## 2023-04-27 NOTE — Telephone Encounter (Signed)
Requested medications are due for refill today.  yes  Requested medications are on the active medications list.  yes  Last refill. 07/12/2022 #180 2 rf  Future visit scheduled.   yes  Notes to clinic.  Rx written to expire 10/24/2022 - Rx is expired.    Requested Prescriptions  Pending Prescriptions Disp Refills   apixaban (ELIQUIS) 5 MG TABS tablet 180 tablet 2    Sig: Take 1 tablet (5 mg total) by mouth 2 (two) times daily.     Hematology:  Anticoagulants - apixaban Failed - 04/27/2023 11:44 AM      Failed - Cr in normal range and within 360 days    Creat  Date Value Ref Range Status  03/21/2023 1.27 (H) 0.60 - 1.00 mg/dL Final         Failed - Valid encounter within last 12 months    Recent Outpatient Visits           1 year ago Atrial fibrillation, unspecified type (HCC)   [facility] Pickard, Priscille Heidelberg, MD   1 year ago Persistent atrial fibrillation (HCC)   [facility] Pickard, Priscille Heidelberg, MD   1 year ago Atrial fibrillation, unspecified type (HCC)   [facility] Donita Brooks, MD   4 years ago Bloating   [facility] Tanya Nones, Priscille Heidelberg, MD   5 years ago Benign essential HTN   [facility] Pickard, Priscille Heidelberg, MD       Future Appointments             In 11 months Pickard, Priscille Heidelberg, MD [facility]            Passed - PLT in normal range and within 360 days    Platelets  Date Value Ref Range Status  03/21/2023 213 140 - 400 Thousand/uL Final  09/07/2022 226 150 - 450 x10E3/uL Final         Passed - HGB in normal range and within 360 days    Hemoglobin  Date Value Ref Range Status  03/21/2023 14.6 11.7 - 15.5 g/dL Final  96/02/5408 81.1 11.1 - 15.9 g/dL Final         Passed - HCT in normal range and within 360 days    HCT  Date Value Ref Range Status  03/21/2023 43.7 35.0 - 45.0 % Final   Hematocrit  Date Value Ref Range Status   09/07/2022 45.1 34.0 - 46.6 % Final         Passed - AST in normal range and within 360 days    AST  Date Value Ref Range Status  03/21/2023 23 10 - 35 U/L Final         Passed - ALT in normal range and within 360 days    ALT  Date Value Ref Range Status  03/21/2023 17 6 - 29 U/L Final

## 2023-05-09 ENCOUNTER — Telehealth: Payer: Self-pay

## 2023-05-09 ENCOUNTER — Telehealth: Payer: Self-pay | Admitting: Family Medicine

## 2023-05-09 NOTE — Telephone Encounter (Signed)
Pt called and states she needs clearance to have teeth pulled for denture fitting. Pt asks that letter be faxed to [phone]. Thank you.

## 2023-05-09 NOTE — Telephone Encounter (Signed)
Prescription Request  05/09/2023  LOV: 03/27/2023  What is the name of the medication or equipment?   ALPRAZolam (XANAX) 0.5 MG tablet  **Patient is out of medication** . Have you contacted your pharmacy to request a refill? Yes   Which pharmacy would you like this sent to?  [facility] [address], [place] - [address] [place] [address] Phone: [phone] Fax: [phone]    Patient notified that their request is being sent to the clinical staff for review and that they should receive a response within 2 business days.   Please advise patient when refill sent in at [phone]

## 2023-05-10 ENCOUNTER — Encounter: Payer: Self-pay | Admitting: Family Medicine

## 2023-05-10 ENCOUNTER — Other Ambulatory Visit: Payer: Self-pay | Admitting: Family Medicine

## 2023-05-10 MED ORDER — ALPRAZOLAM 0.5 MG PO TABS: 0.5000 mg | ORAL_TABLET | Freq: Three times a day (TID) | ORAL | 0 refills | Status: DC | PRN

## 2023-05-11 ENCOUNTER — Other Ambulatory Visit (HOSPITAL_COMMUNITY): Payer: Self-pay | Admitting: *Deleted

## 2023-05-11 MED ORDER — AMIODARONE HCL 200 MG PO TABS: 200.0000 mg | ORAL_TABLET | Freq: Every day | ORAL | 0 refills | Status: DC

## 2023-05-11 NOTE — Telephone Encounter (Signed)
Unable to refill per protocol, Rx request is too soon. Last refill 04/27/23 for 90 days, duplicate request.  Requested Prescriptions  Pending Prescriptions Disp Refills   ELIQUIS 5 MG TABS tablet [Pharmacy Med Name: Eliquis 5 MG Oral Tablet] 180 tablet 0    Sig: Take 1 tablet by mouth twice daily     Hematology:  Anticoagulants - apixaban Failed - 05/10/2023  4:51 PM      Failed - Cr in normal range and within 360 days    Creat  Date Value Ref Range Status  03/21/2023 1.27 (H) 0.60 - 1.00 mg/dL Final         Failed - Valid encounter within last 12 months    Recent Outpatient Visits           1 year ago Atrial fibrillation, unspecified type (HCC)   [facility] Pickard, Priscille Heidelberg, MD   1 year ago Persistent atrial fibrillation (HCC)   [facility] Pickard, Priscille Heidelberg, MD   1 year ago Atrial fibrillation, unspecified type (HCC)   [facility] Donita Brooks, MD   4 years ago Bloating   [facility] Tanya Nones, Priscille Heidelberg, MD   5 years ago Benign essential HTN   [facility] Pickard, Priscille Heidelberg, MD       Future Appointments             In 10 months Pickard, Priscille Heidelberg, MD [place]            Passed - PLT in normal range and within 360 days    Platelets  Date Value Ref Range Status  03/21/2023 213 140 - 400 Thousand/uL Final  09/07/2022 226 150 - 450 x10E3/uL Final         Passed - HGB in normal range and within 360 days    Hemoglobin  Date Value Ref Range Status  03/21/2023 14.6 11.7 - 15.5 g/dL Final  16/08/9603 54.0 11.1 - 15.9 g/dL Final         Passed - HCT in normal range and within 360 days    HCT  Date Value Ref Range Status  03/21/2023 43.7 35.0 - 45.0 % Final   Hematocrit  Date Value Ref Range Status  09/07/2022 45.1 34.0 - 46.6 % Final         Passed - AST in normal range and within 360 days    AST  Date Value Ref Range Status  03/21/2023  23 10 - 35 U/L Final         Passed - ALT in normal range and within 360 days    ALT  Date Value Ref Range Status  03/21/2023 17 6 - 29 U/L Final

## 2023-05-17 ENCOUNTER — Other Ambulatory Visit: Payer: Self-pay | Admitting: Family Medicine

## 2023-05-17 NOTE — Telephone Encounter (Signed)
Requested Prescriptions  Pending Prescriptions Disp Refills   ELIQUIS 5 MG TABS tablet [Pharmacy Med Name: Eliquis 5 MG Oral Tablet] 180 tablet 0    Sig: Take 1 tablet by mouth twice daily     Hematology:  Anticoagulants - apixaban Failed - 05/17/2023  4:37 PM      Failed - Cr in normal range and within 360 days    Creat  Date Value Ref Range Status  03/21/2023 1.27 (H) 0.60 - 1.00 mg/dL Final         Failed - Valid encounter within last 12 months    Recent Outpatient Visits           1 year ago Atrial fibrillation, unspecified type (HCC)   [facility] Pickard, Priscille Heidelberg, MD   1 year ago Persistent atrial fibrillation (HCC)   [facility] Pickard, Priscille Heidelberg, MD   1 year ago Atrial fibrillation, unspecified type (HCC)   [facility] Donita Brooks, MD   4 years ago Bloating   [facility] Tanya Nones, Priscille Heidelberg, MD   5 years ago Benign essential HTN   [facility] Pickard, Priscille Heidelberg, MD       Future Appointments             In 10 months Pickard, Priscille Heidelberg, MD [facility], [facility]            Passed - PLT in normal range and within 360 days    Platelets  Date Value Ref Range Status  03/21/2023 213 140 - 400 Thousand/uL Final  09/07/2022 226 150 - 450 x10E3/uL Final         Passed - HGB in normal range and within 360 days    Hemoglobin  Date Value Ref Range Status  03/21/2023 14.6 11.7 - 15.5 g/dL Final  16/08/9603 54.0 11.1 - 15.9 g/dL Final         Passed - HCT in normal range and within 360 days    HCT  Date Value Ref Range Status  03/21/2023 43.7 35.0 - 45.0 % Final   Hematocrit  Date Value Ref Range Status  09/07/2022 45.1 34.0 - 46.6 % Final         Passed - AST in normal range and within 360 days    AST  Date Value Ref Range Status  03/21/2023 23 10 - 35 U/L Final         Passed - ALT in normal range and within 360 days    ALT  Date Value Ref  Range Status  03/21/2023 17 6 - 29 U/L Final

## 2023-05-24 ENCOUNTER — Encounter: Payer: Medicare Other | Admitting: Pharmacist

## 2023-05-28 ENCOUNTER — Telehealth: Payer: Self-pay | Admitting: Family Medicine

## 2023-05-28 ENCOUNTER — Other Ambulatory Visit: Payer: Self-pay | Admitting: Family Medicine

## 2023-05-28 MED ORDER — APIXABAN 5 MG PO TABS: 5.0000 mg | ORAL_TABLET | Freq: Two times a day (BID) | ORAL | 3 refills | Status: DC

## 2023-05-28 NOTE — Telephone Encounter (Signed)
Patient called to follow up on refill  denial for  apixaban (ELIQUIS) 5 MG TABS tablet [[id number]]   Requesting call back with reason. Patient will be out of medication soon; requesting for refill to be sent in this week.  Pharmacy confirmed as:   [facility] [address], [place] - [address] [place] [address] Phone: [phone]  Fax: [phone]   Please advise at [phone].

## 2023-06-01 ENCOUNTER — Other Ambulatory Visit: Payer: Self-pay | Admitting: Family Medicine

## 2023-06-04 ENCOUNTER — Other Ambulatory Visit (HOSPITAL_COMMUNITY): Payer: Self-pay | Admitting: *Deleted

## 2023-06-04 MED ORDER — AMIODARONE HCL 200 MG PO TABS: 200.0000 mg | ORAL_TABLET | Freq: Every day | ORAL | 0 refills | Status: DC

## 2023-06-04 NOTE — Telephone Encounter (Signed)
Requested Prescriptions  Pending Prescriptions Disp Refills   PARoxetine (PAXIL) 20 MG tablet [Pharmacy Med Name: PARoxetine HCl 20 MG Oral Tablet] 180 tablet 1    Sig: TAKE 1 TABLET BY MOUTH TWICE  DAILY     Psychiatry:  Antidepressants - SSRI Failed - 06/01/2023 10:16 PM      Failed - Valid encounter within last 6 months    Recent Outpatient Visits           1 year ago Atrial fibrillation, unspecified type (HCC)   [facility] Pickard, Priscille Heidelberg, MD   1 year ago Persistent atrial fibrillation [facility])   [facility] Pickard, Priscille Heidelberg, MD   1 year ago Atrial fibrillation, unspecified type [facility])   [facility] Pickard, Priscille Heidelberg, MD   4 years ago Bloating   [facility] Donita Brooks, MD   5 years ago Benign essential HTN   [facility] Pickard, Priscille Heidelberg, MD       Future Appointments             In 9 months Pickard, Priscille Heidelberg, MD [facility], [facility]            Passed - Completed PHQ-2 or PHQ-9 in the last 360 days

## 2023-06-06 ENCOUNTER — Other Ambulatory Visit (HOSPITAL_COMMUNITY): Payer: Self-pay | Admitting: *Deleted

## 2023-06-06 MED ORDER — FUROSEMIDE 40 MG PO TABS: 40.0000 mg | ORAL_TABLET | Freq: Every day | ORAL | 0 refills | Status: DC

## 2023-07-20 ENCOUNTER — Other Ambulatory Visit: Payer: Self-pay | Admitting: Family Medicine

## 2023-07-23 NOTE — Telephone Encounter (Signed)
Requested medication (s) are due for refill today: yes  Requested medication (s) are on the active medication list: yes  Last refill:  05/10/23  Future visit scheduled: yes  Notes to clinic:  Unable to refill per protocol, cannot delegate.      Requested Prescriptions  Pending Prescriptions Disp Refills   ALPRAZolam (XANAX) 0.5 MG tablet [Pharmacy Med Name: ALPRAZolam 0.5 MG Oral Tablet] 30 tablet 0    Sig: TAKE 1 TABLET BY MOUTH THREE TIMES DAILY AS NEEDED FOR ANXIETY     Not Delegated - Psychiatry: Anxiolytics/Hypnotics 2 Failed - 07/20/2023 11:48 AM      Failed - This refill cannot be delegated      Failed - Urine Drug Screen completed in last 360 days      Failed - Valid encounter within last 6 months    Recent Outpatient Visits           1 year ago Atrial fibrillation, unspecified type (HCC)   [facility] Donita Brooks, MD   1 year ago Persistent atrial fibrillation [facility])   [facility] Pickard, Priscille Heidelberg, MD   1 year ago Atrial fibrillation, unspecified type [facility])   [facility] Donita Brooks, MD   4 years ago Bloating   [facility] Donita Brooks, MD   5 years ago Benign essential HTN   [facility] Pickard, Priscille Heidelberg, MD       Future Appointments             In 8 months Pickard, Priscille Heidelberg, MD [facility], [facility]            Passed - Patient is not pregnant

## 2023-07-26 ENCOUNTER — Other Ambulatory Visit (HOSPITAL_COMMUNITY): Payer: Self-pay | Admitting: Physician Assistant

## 2023-08-10 ENCOUNTER — Other Ambulatory Visit (HOSPITAL_COMMUNITY): Payer: Self-pay | Admitting: Physician Assistant

## 2023-09-01 ENCOUNTER — Other Ambulatory Visit (HOSPITAL_COMMUNITY): Payer: Self-pay | Admitting: Cardiology

## 2023-09-03 ENCOUNTER — Other Ambulatory Visit (HOSPITAL_COMMUNITY): Payer: Self-pay | Admitting: *Deleted

## 2023-09-03 MED ORDER — POTASSIUM CHLORIDE CRYS ER 20 MEQ PO TBCR: 20.0000 meq | EXTENDED_RELEASE_TABLET | Freq: Every day | ORAL | 0 refills | Status: DC

## 2023-09-08 ENCOUNTER — Other Ambulatory Visit (HOSPITAL_COMMUNITY): Payer: Self-pay | Admitting: Cardiology

## 2023-09-09 ENCOUNTER — Other Ambulatory Visit: Payer: Self-pay | Admitting: Family Medicine

## 2023-09-10 NOTE — Telephone Encounter (Signed)
Requested medications are due for refill today.  yes  Requested medications are on the active medications list.  yes  Last refill. 07/23/2023 #30 0 rf  Future visit scheduled.   yes  Notes to clinic.  Refill not delegated.    Requested Prescriptions  Pending Prescriptions Disp Refills   ALPRAZolam (XANAX) 0.5 MG tablet [Pharmacy Med Name: ALPRAZolam 0.5 MG Oral Tablet] 30 tablet 0    Sig: TAKE 1 TABLET BY MOUTH THREE TIMES DAILY AS NEEDED FOR ANXIETY     Not Delegated - Psychiatry: Anxiolytics/Hypnotics 2 Failed - 09/09/2023  1:05 PM      Failed - This refill cannot be delegated      Failed - Urine Drug Screen completed in last 360 days      Failed - Valid encounter within last 6 months    Recent Outpatient Visits           1 year ago Atrial fibrillation, unspecified type (HCC)   [facility] Donita Brooks, MD   1 year ago Persistent atrial fibrillation [facility])   [facility] Pickard, Priscille Heidelberg, MD   1 year ago Atrial fibrillation, unspecified type [facility])   [facility] Donita Brooks, MD   4 years ago Bloating   [facility] Donita Brooks, MD   5 years ago Benign essential HTN   [facility] Pickard, Priscille Heidelberg, MD       Future Appointments             In 6 months Pickard, Priscille Heidelberg, MD [facility], [facility]            Passed - Patient is not pregnant

## 2023-09-20 ENCOUNTER — Telehealth: Payer: Self-pay

## 2023-09-20 ENCOUNTER — Other Ambulatory Visit: Payer: Self-pay | Admitting: Family Medicine

## 2023-09-20 MED ORDER — ALPRAZOLAM 0.5 MG PO TABS: 0.5000 mg | ORAL_TABLET | Freq: Three times a day (TID) | ORAL | 0 refills | Status: DC | PRN

## 2023-09-20 NOTE — Telephone Encounter (Signed)
Pt called in to check on status of this refill ALPRAZolam (XANAX) 0.5 MG tablet [[id number].   LOV: 03/27/23 CPE  PHARMACY: [id number] - [place], [place] - [address] [place] [address] Phone: [phone]  Fax: [phone]      CB#: [phone]

## 2023-09-25 ENCOUNTER — Other Ambulatory Visit (HOSPITAL_COMMUNITY): Payer: Self-pay | Admitting: Physician Assistant

## 2023-10-11 ENCOUNTER — Other Ambulatory Visit: Payer: Medicare Other

## 2023-10-13 ENCOUNTER — Other Ambulatory Visit: Payer: Self-pay | Admitting: Family Medicine

## 2023-10-15 NOTE — Telephone Encounter (Signed)
Requested by interface surescripts. Last OV 03/27/23 last labs 03/21/23. Future visit in 5 months Requested Prescriptions  Pending Prescriptions Disp Refills   losartan (COZAAR) 50 MG tablet [Pharmacy Med Name: Losartan Potassium 50 MG Oral Tablet] 100 tablet 2    Sig: TAKE 1 TABLET BY MOUTH ONCE  DAILY     Cardiovascular:  Angiotensin Receptor Blockers Failed - 10/15/2023  1:28 PM      Failed - Cr in normal range and within 180 days    Creat  Date Value Ref Range Status  03/21/2023 1.27 (H) 0.60 - 1.00 mg/dL Final         Failed - K in normal range and within 180 days    Potassium  Date Value Ref Range Status  03/21/2023 4.1 3.5 - 5.3 mmol/L Final         Failed - Valid encounter within last 6 months    Recent Outpatient Visits           1 year ago Atrial fibrillation, unspecified type (HCC)   [facility] Donita Brooks, MD   1 year ago Persistent atrial fibrillation (HCC)   [facility] Pickard, Priscille Heidelberg, MD   1 year ago Atrial fibrillation, unspecified type [facility])   [facility] Pickard, Priscille Heidelberg, MD   4 years ago Bloating   [facility] Donita Brooks, MD   5 years ago Benign essential HTN   [facility] Pickard, Priscille Heidelberg, MD       Future Appointments             In 5 months Pickard, Priscille Heidelberg, MD [facility], [facility]            Passed - Patient is not pregnant      Passed - Last BP in normal range    BP Readings from Last 1 Encounters:  03/27/23 130/82

## 2023-10-21 ENCOUNTER — Other Ambulatory Visit (HOSPITAL_COMMUNITY): Payer: Self-pay | Admitting: Physician Assistant

## 2023-10-23 ENCOUNTER — Other Ambulatory Visit: Payer: Self-pay | Admitting: Family Medicine

## 2023-10-23 NOTE — Telephone Encounter (Signed)
Requested Prescriptions  Pending Prescriptions Disp Refills   PARoxetine (PAXIL) 20 MG tablet [Pharmacy Med Name: PARoxetine HCl 20 MG Oral Tablet] 180 tablet 3    Sig: TAKE 1 TABLET BY MOUTH TWICE  DAILY     Psychiatry:  Antidepressants - SSRI Failed - 10/23/2023  1:29 AM      Failed - Valid encounter within last 6 months    Recent Outpatient Visits           1 year ago Atrial fibrillation, unspecified type (HCC)   [facility] Pickard, Priscille Heidelberg, MD   1 year ago Persistent atrial fibrillation [facility])   [facility] Pickard, Priscille Heidelberg, MD   1 year ago Atrial fibrillation, unspecified type [facility])   [facility] Pickard, Priscille Heidelberg, MD   4 years ago Bloating   [facility] Donita Brooks, MD   5 years ago Benign essential HTN   [facility] Pickard, Priscille Heidelberg, MD       Future Appointments             In 5 months Pickard, Priscille Heidelberg, MD [facility], [facility]            Passed - Completed PHQ-2 or PHQ-9 in the last 360 days

## 2023-11-02 ENCOUNTER — Other Ambulatory Visit (HOSPITAL_COMMUNITY): Payer: Self-pay | Admitting: Physician Assistant

## 2023-11-09 ENCOUNTER — Other Ambulatory Visit: Payer: Self-pay | Admitting: Family Medicine

## 2023-11-09 ENCOUNTER — Other Ambulatory Visit (HOSPITAL_COMMUNITY): Payer: Self-pay | Admitting: Physician Assistant

## 2023-11-12 NOTE — Telephone Encounter (Signed)
Requested Prescriptions  Pending Prescriptions Disp Refills   PARoxetine (PAXIL) 20 MG tablet [Pharmacy Med Name: PARoxetine HCl 20 MG Oral Tablet] 180 tablet 0    Sig: TAKE 1 TABLET BY MOUTH TWICE  DAILY     Psychiatry:  Antidepressants - SSRI Failed - 11/12/2023  8:23 AM      Failed - Valid encounter within last 6 months    Recent Outpatient Visits           1 year ago Atrial fibrillation, unspecified type (HCC)   [facility] Pickard, Priscille Heidelberg, MD   1 year ago Persistent atrial fibrillation [facility])   [facility] Pickard, Priscille Heidelberg, MD   1 year ago Atrial fibrillation, unspecified type [facility])   [facility] Pickard, Priscille Heidelberg, MD   4 years ago Bloating   [facility] Donita Brooks, MD   5 years ago Benign essential HTN   [facility] Pickard, Priscille Heidelberg, MD       Future Appointments             In 4 months Pickard, Priscille Heidelberg, MD [facility], [facility]            Passed - Completed PHQ-2 or PHQ-9 in the last 360 days

## 2023-11-14 ENCOUNTER — Other Ambulatory Visit: Payer: Self-pay | Admitting: Family Medicine

## 2024-01-04 ENCOUNTER — Other Ambulatory Visit (HOSPITAL_COMMUNITY): Payer: Self-pay | Admitting: Physician Assistant

## 2024-01-06 ENCOUNTER — Other Ambulatory Visit: Payer: Self-pay | Admitting: Family Medicine

## 2024-01-07 ENCOUNTER — Telehealth (HOSPITAL_COMMUNITY): Payer: Self-pay

## 2024-01-07 NOTE — Telephone Encounter (Signed)
Left message for patient to call back to schedule overdue follow up appointment.

## 2024-01-23 ENCOUNTER — Other Ambulatory Visit (HOSPITAL_COMMUNITY): Payer: Self-pay | Admitting: Physician Assistant

## 2024-01-23 ENCOUNTER — Other Ambulatory Visit (HOSPITAL_COMMUNITY): Payer: Self-pay | Admitting: Cardiology

## 2024-01-29 ENCOUNTER — Other Ambulatory Visit (HOSPITAL_COMMUNITY): Payer: Self-pay | Admitting: Physician Assistant

## 2024-02-06 ENCOUNTER — Other Ambulatory Visit (HOSPITAL_COMMUNITY): Payer: Self-pay | Admitting: Physician Assistant

## 2024-02-06 ENCOUNTER — Other Ambulatory Visit (HOSPITAL_COMMUNITY): Payer: Self-pay | Admitting: Cardiology

## 2024-02-20 ENCOUNTER — Encounter (HOSPITAL_COMMUNITY): Payer: Self-pay | Admitting: Emergency Medicine

## 2024-02-20 ENCOUNTER — Other Ambulatory Visit: Payer: Self-pay

## 2024-02-20 ENCOUNTER — Emergency Department (HOSPITAL_COMMUNITY)
Admission: EM | Admit: 2024-02-20 | Discharge: 2024-02-20 | Disposition: A | Discharge status: Home / Self Care | Attending: Emergency Medicine | Admitting: Emergency Medicine

## 2024-02-20 DIAGNOSIS — N3000 Acute cystitis without hematuria: Secondary | ICD-10-CM | POA: Diagnosis not present

## 2024-02-20 DIAGNOSIS — R300 Dysuria: Secondary | ICD-10-CM | POA: Diagnosis present

## 2024-02-20 LAB — URINALYSIS, ROUTINE W REFLEX MICROSCOPIC
Bilirubin Urine: NEGATIVE
Glucose, UA: NEGATIVE mg/dL
Ketones, ur: 5 mg/dL — AB
Nitrite: NEGATIVE
Protein, ur: NEGATIVE mg/dL
Specific Gravity, Urine: 1.006 (ref 1.005–1.030)
pH: 7 (ref 5.0–8.0)

## 2024-02-20 LAB — COMPREHENSIVE METABOLIC PANEL
ALT: 12 U/L (ref 0–44)
AST: 26 U/L (ref 15–41)
Albumin: 3.7 g/dL (ref 3.5–5.0)
Alkaline Phosphatase: 72 U/L (ref 38–126)
Anion gap: 14 (ref 5–15)
BUN: 18 mg/dL (ref 8–23)
CO2: 28 mmol/L (ref 22–32)
Calcium: 9.3 mg/dL (ref 8.9–10.3)
Chloride: 92 mmol/L — ABNORMAL LOW (ref 98–111)
Creatinine, Ser: 1.11 mg/dL — ABNORMAL HIGH (ref 0.44–1.00)
GFR, Estimated: 51 mL/min — ABNORMAL LOW (ref >60)
Glucose, Bld: 110 mg/dL — ABNORMAL HIGH (ref 70–99)
Potassium: 3.8 mmol/L (ref 3.5–5.1)
Sodium: 134 mmol/L — ABNORMAL LOW (ref 135–145)
Total Bilirubin: 0.6 mg/dL (ref 0.0–1.2)
Total Protein: 7.5 g/dL (ref 6.5–8.1)

## 2024-02-20 LAB — CBC WITH DIFFERENTIAL/PLATELET
Abs Immature Granulocytes: 0.05 10*3/uL (ref 0.00–0.07)
Basophils Absolute: 0 10*3/uL (ref 0.0–0.1)
Basophils Relative: 0 %
Eosinophils Absolute: 0.1 10*3/uL (ref 0.0–0.5)
Eosinophils Relative: 1 %
HCT: 42.2 % (ref 36.0–46.0)
Hemoglobin: 14.4 g/dL (ref 12.0–15.0)
Immature Granulocytes: 1 %
Lymphocytes Relative: 19 %
Lymphs Abs: 1.8 10*3/uL (ref 0.7–4.0)
MCH: 28.6 pg (ref 26.0–34.0)
MCHC: 34.1 g/dL (ref 30.0–36.0)
MCV: 83.7 fL (ref 80.0–100.0)
Monocytes Absolute: 0.7 10*3/uL (ref 0.1–1.0)
Monocytes Relative: 8 %
Neutro Abs: 6.8 10*3/uL (ref 1.7–7.7)
Neutrophils Relative %: 71 %
Platelets: 293 10*3/uL (ref 150–400)
RBC: 5.04 MIL/uL (ref 3.87–5.11)
RDW: 11.9 % (ref 11.5–15.5)
WBC: 9.6 10*3/uL (ref 4.0–10.5)
nRBC: 0 % (ref 0.0–0.2)

## 2024-02-20 MED ORDER — CEPHALEXIN 500 MG PO CAPS: 500.0000 mg | ORAL_CAPSULE | Freq: Two times a day (BID) | ORAL | 0 refills | Status: DC

## 2024-02-20 MED ORDER — SODIUM CHLORIDE 0.9 % IV SOLN
1.0000 g | Freq: Once | INTRAVENOUS | Status: AC
  Administered 2024-02-20: 1 g via INTRAVENOUS
  Filled 2024-02-20: qty 10

## 2024-02-20 MED ORDER — PHENAZOPYRIDINE HCL 95 MG PO TABS: 95.0000 mg | ORAL_TABLET | Freq: Three times a day (TID) | ORAL | 0 refills | Status: AC | PRN

## 2024-02-20 NOTE — ED Triage Notes (Signed)
 Patient endorses dysuria and suprapubic pain that started this evening.  Patient has a history of UTIs and states this feels the same.

## 2024-02-20 NOTE — ED Provider Notes (Signed)
 [facility] Emergency Department Provider Note MRN:  [id number]  Arrival date & time: 02/20/24     Chief Complaint   Urinary Tract Infection   History of Present Illness   Kristina Dougherty is a 78 y.o. year-old female presents to the ED with chief complaint of dysuria and suprapubic discomfort.  She reports history of UTIs and states that this feels exactly the same.  She denies any fevers, chills, nausea, or vomiting.  States that onset of symptoms was last night.  She denies any other associated symptoms.  History provided by patient.   Review of Systems  Pertinent positive and negative review of systems noted in HPI.    Physical Exam   Vitals:   02/20/24 0240  BP: (!) 155/101  Pulse: 93  Resp: 18  Temp: 98.1 F (36.7 C)  SpO2: 91%    CONSTITUTIONAL:  well-appearing, NAD NEURO:  Alert and oriented x 3, CN 3-12 grossly intact EYES:  eyes equal and reactive ENT/NECK:  Supple, no stridor  CARDIO:  normal rate, regular rhythm, appears well-perfused  PULM:  No respiratory distress, CTAB GI/GU:  non-distended,  MSK/SPINE:  No gross deformities, no edema, moves all extremities  SKIN:  no rash, atraumatic   *Additional and/or pertinent findings included in MDM below  Diagnostic and Interventional Summary    EKG Interpretation Date/Time:    Ventricular Rate:    PR Interval:    QRS Duration:    QT Interval:    QTC Calculation:   R Axis:      Text Interpretation:         Labs Reviewed  URINALYSIS, ROUTINE W REFLEX MICROSCOPIC - Abnormal; Notable for the following components:      Result Value   Hgb urine dipstick SMALL (*)    Ketones, ur 5 (*)    Leukocytes,Ua LARGE (*)    Bacteria, UA RARE (*)    All other components within normal limits  COMPREHENSIVE METABOLIC PANEL - Abnormal; Notable for the following components:   Sodium 134 (*)    Chloride 92 (*)    Glucose, Bld 110 (*)    Creatinine, Ser 1.11 (*)    GFR, Estimated 51  (*)    All other components within normal limits  CBC WITH DIFFERENTIAL/PLATELET    No orders to display    Medications  cefTRIAXone (ROCEPHIN) 1 g in sodium chloride 0.9 % 100 mL IVPB (has no administration in time range)     Procedures  /  Critical Care Procedures  ED Course and Medical Decision Making  I have reviewed the triage vital signs, the nursing notes, and pertinent available records from the EMR.  Social Determinants Affecting Complexity of Care: Patient has no clinically significant social determinants affecting this chief complaint..   ED Course:    Medical Decision Making Patient here with dysuria.  Onset was last night.  This is the same as prior presentations of UTIs.  She states it feels the same. She looks well.  UA is abnormal and worrisome for UTI.  Will treat with Rocephin IV and DC home on keflex and azo.  Amount and/or Complexity of Data Reviewed Labs: ordered.  Risk OTC drugs. Prescription drug management.         Consultants: No consultations were needed in caring for this patient.   Treatment and Plan: Emergency department workup does not suggest an emergent condition requiring admission or immediate intervention beyond  what has been performed at this time. The  patient is safe for discharge and has  been instructed to return immediately for worsening symptoms, change in  symptoms or any other concerns    Final Clinical Impressions(s) / ED Diagnoses     ICD-10-CM   1. Acute cystitis without hematuria  N30.00       ED Discharge Orders          Ordered    cephALEXin (KEFLEX) 500 MG capsule  2 times daily        02/20/24 0439    phenazopyridine (PYRIDIUM) 95 MG tablet  3 times daily PRN        02/20/24 0439              Discharge Instructions Discussed with and Provided to Patient:   Discharge Instructions   None      Roxy Horseman, PA-C 02/20/24 0444    Palumbo, April, MD 02/20/24 [phone]

## 2024-02-21 ENCOUNTER — Telehealth: Payer: Self-pay

## 2024-02-21 NOTE — Transitions of Care (Post Inpatient/ED Visit) (Signed)
   02/21/2024  Name: Kristina Dougherty MRN: [id number] DOB: July 15, 1946  Today's TOC FU Call Status: Today's TOC FU Call Status:: Unsuccessful Call (1st Attempt) Unsuccessful Call (1st Attempt) Date: 02/21/24  Attempted to reach the patient regarding the most recent Inpatient/ED visit.  Follow Up Plan: Additional outreach attempts will be made to reach the patient to complete the Transitions of Care (Post Inpatient/ED visit) call.   Signature Karena Addison, LPN [facility] Nurse Health Advisor Direct Dial [phone]

## 2024-02-22 NOTE — Transitions of Care (Post Inpatient/ED Visit) (Signed)
   02/22/2024  Name: Kristina Dougherty MRN: [id number] DOB: 10-24-1946  Today's TOC FU Call Status: Today's TOC FU Call Status:: Unsuccessful Call (2nd Attempt) Unsuccessful Call (1st Attempt) Date: 02/21/24 Unsuccessful Call (2nd Attempt) Date: 02/22/24  Attempted to reach the patient regarding the most recent Inpatient/ED visit.  Follow Up Plan: Additional outreach attempts will be made to reach the patient to complete the Transitions of Care (Post Inpatient/ED visit) call.   Signature Karena Addison, LPN [facility] Nurse Health Advisor Direct Dial [phone]

## 2024-02-26 NOTE — Transitions of Care (Post Inpatient/ED Visit) (Signed)
   02/26/2024  Name: Kristina Dougherty MRN: [id number] DOB: Jan 15, 1946  Today's TOC FU Call Status: Today's TOC FU Call Status:: Unsuccessful Call (3rd Attempt) Unsuccessful Call (1st Attempt) Date: 02/21/24 Unsuccessful Call (2nd Attempt) Date: 02/22/24 Unsuccessful Call (3rd Attempt) Date: 02/26/24  Attempted to reach the patient regarding the most recent Inpatient/ED visit.  Follow Up Plan: No further outreach attempts will be made at this time. We have been unable to contact the patient.  Signature Karena Addison, LPN [facility] Nurse Health Advisor Direct Dial [phone]

## 2024-03-03 ENCOUNTER — Telehealth: Payer: Self-pay

## 2024-03-03 ENCOUNTER — Other Ambulatory Visit: Payer: Self-pay | Admitting: Family Medicine

## 2024-03-03 DIAGNOSIS — R300 Dysuria: Secondary | ICD-10-CM

## 2024-03-03 MED ORDER — SULFAMETHOXAZOLE-TRIMETHOPRIM 800-160 MG PO TABS: 1.0000 | ORAL_TABLET | Freq: Two times a day (BID) | ORAL | 0 refills | Status: DC

## 2024-03-03 NOTE — Telephone Encounter (Signed)
 Copied from CRM [id number]. Topic: Clinical - Prescription Issue >> Mar 03, 2024  9:43 AM Shon Hale wrote: Reason for CRM: Patient requesting to speak to [facility] in regards to rx refill request.  Wanting to speak to [facility] only about the following rx:  Alprazolam Amiodarone Fluid Pill  UTI antibiotic. Patient states the pharmacy gave her the wrong rx and needs this corrected.   Requesting callback, [phone]

## 2024-03-04 ENCOUNTER — Other Ambulatory Visit: Payer: Self-pay | Admitting: Family Medicine

## 2024-03-04 ENCOUNTER — Other Ambulatory Visit (HOSPITAL_COMMUNITY): Payer: Self-pay | Admitting: Physician Assistant

## 2024-03-05 ENCOUNTER — Other Ambulatory Visit: Payer: Self-pay | Admitting: Family Medicine

## 2024-03-05 NOTE — Telephone Encounter (Signed)
 Requested medication (s) are due for refill today: Yes  Requested medication (s) are on the active medication list: Yes  Last refill:  11/15/23  Future visit scheduled: Yes  Notes to clinic:  Unable to refill per protocol, cannot delegate.      Requested Prescriptions  Pending Prescriptions Disp Refills   ALPRAZolam (XANAX) 0.5 MG tablet [Pharmacy Med Name: ALPRAZolam 0.5 MG Oral Tablet] 30 tablet 0    Sig: TAKE 1 TABLET BY MOUTH THREE TIMES DAILY AS NEEDED FOR ANXIETY     Not Delegated - Psychiatry: Anxiolytics/Hypnotics 2 Failed - 03/05/2024 11:21 AM      Failed - This refill cannot be delegated      Failed - Urine Drug Screen completed in last 360 days      Failed - Valid encounter within last 6 months    Recent Outpatient Visits           11 months ago Dysuria   [facility] Donita Brooks, MD       Future Appointments             In 3 weeks Pickard, Priscille Heidelberg, MD [facility], [facility]            Passed - Patient is not pregnant

## 2024-03-05 NOTE — Telephone Encounter (Signed)
 Patient called in today for refill of alprazolam.

## 2024-03-05 NOTE — Telephone Encounter (Signed)
 Patient called, left VM to return the call to the office. If she returns the call, let the patient know the medication amiodarone is being refilled by cardiologist at the [facility], noted to call [phone] to schedule appointment for more refills.

## 2024-03-05 NOTE — Telephone Encounter (Signed)
 Duplicate request  Copied from CRM [id number]. Topic: Clinical - Medication Refill >> Mar 05, 2024  9:34 AM Franchot Heidelberg wrote: Most Recent Primary Care Visit:  Provider: Lynnea Ferrier T  Department[facility]  Visit Type: PHYSICAL  Date: 03/27/2023  Medication: ALPRAZolam (XANAX) 0.5 MG tablet  amiodarone (PACERONE) 200 MG tablet   Has the patient contacted their pharmacy? Yes (Agent: If no, request that the patient contact the pharmacy for the refill. If patient does not wish to contact the pharmacy document the reason why and proceed with request.) (Agent: If yes, when and what did the pharmacy advise?)  Is this the correct pharmacy for this prescription? Yes If no, delete pharmacy and type the correct one.  This is the patient's preferred pharmacy:  [facility] [address] [place] - [address] [place] [address] Phone: [phone] Fax: [phone]   Has the prescription been filled recently? Yes  Is the patient out of the medication? Yes  Has the patient been seen for an appointment in the last year OR does the patient have an upcoming appointment? Yes  Can we respond through MyChart? Yes  Agent: Please be advised that Rx refills may take up to 3 business days. We ask that you follow-up with your pharmacy.

## 2024-03-05 NOTE — Telephone Encounter (Signed)
 Alprazolam requested yesterday by pharmacy, encounter routed to the office today for provider review.

## 2024-03-06 ENCOUNTER — Encounter: Payer: Self-pay | Admitting: Family Medicine

## 2024-03-06 ENCOUNTER — Ambulatory Visit (INDEPENDENT_AMBULATORY_CARE_PROVIDER_SITE_OTHER): Admitting: Family Medicine

## 2024-03-06 VITALS — BP 142/90 | HR 78 | Temp 98.5°F | Ht 62.0 in | Wt 150.0 lb

## 2024-03-06 DIAGNOSIS — R300 Dysuria: Secondary | ICD-10-CM

## 2024-03-06 LAB — URINALYSIS, ROUTINE W REFLEX MICROSCOPIC
Bacteria, UA: NONE SEEN /HPF
Glucose, UA: NEGATIVE
Nitrite: NEGATIVE
Specific Gravity, Urine: 1.025 (ref 1.001–1.035)
pH: 6 (ref 5.0–8.0)

## 2024-03-06 LAB — MICROSCOPIC MESSAGE

## 2024-03-06 MED ORDER — CIPROFLOXACIN HCL 500 MG PO TABS
500.0000 mg | ORAL_TABLET | Freq: Two times a day (BID) | ORAL | 0 refills | Status: AC
Start: 2024-03-06 — End: 2024-03-13

## 2024-03-06 NOTE — Progress Notes (Signed)
 Subjective:    Patient ID: Kristina Dougherty, female    DOB: [birth year], 78 y.o.   MRN: [id number] Patient went to the emergency room March 27 and was diagnosed with urinary tract infection.  She was treated with Keflex.  She states that the symptoms started to improve but then worsened.  She called me at the beginning of this week and I did call out Bactrim.  There is some confusion as to whether she actually got the Bactrim and is taking it.  She gave conflicting answers and is not certain herself.  However she continues to report dysuria and pelvic discomfort.  Urinalysis today does show trace leukocyte esterase and blood. Office Visit on 03/06/2024  Component Date Value Ref Range Status   Color, Urine 03/06/2024 YELLOW  YELLOW Final   APPearance 03/06/2024 SLIGHTLY CLOUDY (A)  CLEAR Final   Specific Gravity, Urine 03/06/2024 1.025  1.001 - 1.035 Final   pH 03/06/2024 6.0  5.0 - 8.0 Final   Glucose, UA 03/06/2024 NEGATIVE  NEGATIVE Final   Bilirubin Urine 03/06/2024 2+ (A)  NEGATIVE Final   Comment: Presumptive positive bilirubin. Consider confirmation  by serum bilirubin if clinically indicated.    Ketones, ur 03/06/2024 TRACE (A)  NEGATIVE Final   Hgb urine dipstick 03/06/2024 3+ (A)  NEGATIVE Final   Protein, ur 03/06/2024 1+ (A)  NEGATIVE Final   Nitrite 03/06/2024 NEGATIVE  NEGATIVE Final   Leukocytes,Ua 03/06/2024 TRACE (A)  NEGATIVE Final   WBC, UA 03/06/2024 0-5  0 - 5 /HPF Final   RBC / HPF 03/06/2024 10-20 (A)  0 - 2 /HPF Final   Squamous Epithelial / HPF 03/06/2024 0-5  < OR = 5 /HPF Final   Bacteria, UA 03/06/2024 NONE SEEN  NONE SEEN /HPF Final   Hyaline Cast 03/06/2024 1-3 (A)  NONE SEEN /LPF Final   Note 03/06/2024    Final   Comment: This urine was analyzed for the presence of WBC,  RBC, bacteria, casts, and other formed elements.  Only those elements seen were reported. . .   Admission on 02/20/2024, Discharged on 02/20/2024  Component Date Value Ref Range  Status   Color, Urine 02/20/2024 YELLOW  YELLOW Final   APPearance 02/20/2024 CLEAR  CLEAR Final   Specific Gravity, Urine 02/20/2024 1.006  1.005 - 1.030 Final   pH 02/20/2024 7.0  5.0 - 8.0 Final   Glucose, UA 02/20/2024 NEGATIVE  NEGATIVE mg/dL Final   Hgb urine dipstick 02/20/2024 SMALL (A)  NEGATIVE Final   Bilirubin Urine 02/20/2024 NEGATIVE  NEGATIVE Final   Ketones, ur 02/20/2024 5 (A)  NEGATIVE mg/dL Final   Protein, ur 40/98/1191 NEGATIVE  NEGATIVE mg/dL Final   Nitrite 47/82/9562 NEGATIVE  NEGATIVE Final   Leukocytes,Ua 02/20/2024 LARGE (A)  NEGATIVE Final   RBC / HPF 02/20/2024 0-5  0 - 5 RBC/hpf Final   WBC, UA 02/20/2024 11-20  0 - 5 WBC/hpf Final   Bacteria, UA 02/20/2024 RARE (A)  NONE SEEN Final   Squamous Epithelial / HPF 02/20/2024 6-10  0 - 5 /HPF Final   Performed at [facility], [address], [place], [place] [address]   WBC 02/20/2024 9.6  4.0 - 10.5 K/uL Final   RBC 02/20/2024 5.04  3.87 - 5.11 MIL/uL Final   Hemoglobin 02/20/2024 14.4  12.0 - 15.0 g/dL Final   HCT 57/84/6962 42.2  36.0 - 46.0 % Final   MCV 02/20/2024 83.7  80.0 - 100.0 fL Final  MCH 02/20/2024 28.6  26.0 - 34.0 pg Final   MCHC 02/20/2024 34.1  30.0 - 36.0 g/dL Final   RDW 86/57/8469 11.9  11.5 - 15.5 % Final   Platelets 02/20/2024 293  150 - 400 K/uL Final   nRBC 02/20/2024 0.0  0.0 - 0.2 % Final   Neutrophils Relative % 02/20/2024 71  % Final   Neutro Abs 02/20/2024 6.8  1.7 - 7.7 K/uL Final   Lymphocytes Relative 02/20/2024 19  % Final   Lymphs Abs 02/20/2024 1.8  0.7 - 4.0 K/uL Final   Monocytes Relative 02/20/2024 8  % Final   Monocytes Absolute 02/20/2024 0.7  0.1 - 1.0 K/uL Final   Eosinophils Relative 02/20/2024 1  % Final   Eosinophils Absolute 02/20/2024 0.1  0.0 - 0.5 K/uL Final   Basophils Relative 02/20/2024 0  % Final   Basophils Absolute 02/20/2024 0.0  0.0 - 0.1 K/uL Final   Immature Granulocytes 02/20/2024 1  % Final   Abs Immature Granulocytes  02/20/2024 0.05  0.00 - 0.07 K/uL Final   Performed at [facility], [address], [place], [place] [address]   Sodium 02/20/2024 134 (L)  135 - 145 mmol/L Final   Potassium 02/20/2024 3.8  3.5 - 5.1 mmol/L Final   Chloride 02/20/2024 92 (L)  98 - 111 mmol/L Final   CO2 02/20/2024 28  22 - 32 mmol/L Final   Glucose, Bld 02/20/2024 110 (H)  70 - 99 mg/dL Final   Glucose reference range applies only to samples taken after fasting for at least 8 hours.   BUN 02/20/2024 18  8 - 23 mg/dL Final   Creatinine, Ser 02/20/2024 1.11 (H)  0.44 - 1.00 mg/dL Final   Calcium 84/13/2440 9.3  8.9 - 10.3 mg/dL Final   Total Protein 09/23/2535 7.5  6.5 - 8.1 g/dL Final   Albumin 64/40/3474 3.7  3.5 - 5.0 g/dL Final   AST 25/95/6387 26  15 - 41 U/L Final   ALT 02/20/2024 12  0 - 44 U/L Final   Alkaline Phosphatase 02/20/2024 72  38 - 126 U/L Final   Total Bilirubin 02/20/2024 0.6  0.0 - 1.2 mg/dL Final   GFR, Estimated 02/20/2024 51 (L)  >60 mL/min Final   Comment: (NOTE) Calculated using the CKD-EPI Creatinine Equation (2021)    Anion gap 02/20/2024 14  5 - 15 Final   Performed at [facility], [address], [place], [place] [address]      Past Medical History:  Diagnosis Date   Anxiety    Arthritis    Atrial fibrillation (HCC)    Deaf, right    Depression    Hypertension    Osteopenia    Past Surgical History:  Procedure Laterality Date   ABDOMINAL HYSTERECTOMY     APPENDECTOMY     ATRIAL FIBRILLATION ABLATION N/A 09/28/2022   Procedure: ATRIAL FIBRILLATION ABLATION;  Surgeon: Regan Lemming, MD;  Location: [facility] INVASIVE CV LAB;  Service: Cardiovascular;  Laterality: N/A;   BUBBLE STUDY  01/09/2022   Procedure: BUBBLE STUDY;  Surgeon: Chilton Si, MD;  Location: [facility] ENDOSCOPY;  Service: Cardiovascular;;   CARDIOVERSION N/A 01/09/2022   Procedure: CARDIOVERSION;  Surgeon: Chilton Si, MD;  Location: [facility] ENDOSCOPY;  Service:  Cardiovascular;  Laterality: N/A;   CARDIOVERSION N/A 01/20/2022   Procedure: CARDIOVERSION;  Surgeon: Jake Bathe, MD;  Location: [facility] ENDOSCOPY;  Service: Cardiovascular;  Laterality: N/A;   CARDIOVERSION N/A 04/05/2022   Procedure:  CARDIOVERSION;  Surgeon: Jake Bathe, MD;  Location: [facility] ENDOSCOPY;  Service: Cardiovascular;  Laterality: N/A;   TEE WITHOUT CARDIOVERSION N/A 01/09/2022   Procedure: TRANSESOPHAGEAL ECHOCARDIOGRAM (TEE);  Surgeon: Chilton Si, MD;  Location: [facility] ENDOSCOPY;  Service: Cardiovascular;  Laterality: N/A;   TEE WITHOUT CARDIOVERSION N/A 01/20/2022   Procedure: TRANSESOPHAGEAL ECHOCARDIOGRAM (TEE);  Surgeon: Jake Bathe, MD;  Location: [facility] ENDOSCOPY;  Service: Cardiovascular;  Laterality: N/A;   Current Outpatient Medications on File Prior to Visit  Medication Sig Dispense Refill   acetaminophen (TYLENOL) 500 MG tablet Take 500 mg by mouth every 6 (six) hours as needed (pain.).     ALPRAZolam (XANAX) 0.5 MG tablet TAKE 1 TABLET BY MOUTH THREE TIMES DAILY AS NEEDED FOR ANXIETY 30 tablet 0   amiodarone (PACERONE) 200 MG tablet Take 1 tablet (200 mg total) by mouth daily. No further fills without appt [phone] 30 tablet 0   apixaban (ELIQUIS) 5 MG TABS tablet Take 1 tablet (5 mg total) by mouth 2 (two) times daily. 180 tablet 3   clobetasol cream (TEMOVATE) 0.05 % APPLY ONE APPLICATION TOPICALLY TWO TIMES DAILY 30 g 3   furosemide (LASIX) 40 MG tablet Take 1 tablet (40 mg total) by mouth daily. Appt req for any further fills [phone] 15 tablet 0   losartan (COZAAR) 50 MG tablet TAKE 1 TABLET BY MOUTH ONCE  DAILY 100 tablet 2   ondansetron (ZOFRAN) 4 MG tablet Take 1 tablet (4 mg total) by mouth every 6 (six) hours as needed for nausea. 20 tablet 0   pantoprazole (PROTONIX) 40 MG tablet TAKE 1 TABLET BY MOUTH DAILY 30 tablet 0   PARoxetine (PAXIL) 20 MG tablet TAKE 1 TABLET BY MOUTH TWICE  DAILY 180 tablet 3   phenazopyridine (PYRIDIUM) 95 MG tablet Take 1 tablet  (95 mg total) by mouth 3 (three) times daily as needed for pain. 10 tablet 0   polyethylene glycol (MIRALAX / GLYCOLAX) 17 g packet Take 17 g by mouth daily. (Patient taking differently: Take 17 g by mouth daily as needed for moderate constipation.) 14 each 0   potassium chloride SA (KLOR-CON M) 20 MEQ tablet Take 1 tablet (20 mEq total) by mouth daily. Appointment Required For Further Refills [phone] 30 tablet 0   sulfamethoxazole-trimethoprim (BACTRIM DS) 800-160 MG tablet Take 1 tablet by mouth 2 (two) times daily. 10 tablet 0   No current facility-administered medications on file prior to visit.   Allergies  Allergen Reactions   Morphine And Codeine     Severe HA   Nitrofurantoin Nausea And Vomiting   Social History   Socioeconomic History   Marital status: Divorced    Spouse name: Not on file   Number of children: Not on file   Years of education: Not on file   Highest education level: Not on file  Occupational History   Not on file  Tobacco Use   Smoking status: Former    Current packs/day: 0.00    Types: Cigarettes    Quit date: 08/27/2021    Years since quitting: 2.5   Smokeless tobacco: Never   Tobacco comments:    Former smoker 10/24/22.  Substance and Sexual Activity   Alcohol use: No   Drug use: No   Sexual activity: Never  Other Topics Concern   Not on file  Social History Narrative   Not on file   Social Drivers of Health   Financial Resource Strain: Low Risk  (01/18/2023)   Overall Financial  Resource Strain (CARDIA)    Difficulty of Paying Living Expenses: Not very hard  Food Insecurity: No Food Insecurity (01/18/2023)   Hunger Vital Sign    Worried About Running Out of Food in the Last Year: Never true    Ran Out of Food in the Last Year: Never true  Transportation Needs: No Transportation Needs (09/29/2022)   PRAPARE - Administrator, Civil Service (Medical): No    Lack of Transportation (Non-Medical): No  Physical Activity:  Sufficiently Active (03/23/2022)   Exercise Vital Sign    Days of Exercise per Week: 4 days    Minutes of Exercise per Session: 40 min  Stress: No Stress Concern Present (03/23/2022)   [employer] of Occupational Health - Occupational Stress Questionnaire    Feeling of Stress : Not at all  Social Connections: Moderately Isolated (03/23/2022)   Social Connection and Isolation Panel [NHANES]    Frequency of Communication with Friends and Family: More than three times a week    Frequency of Social Gatherings with Friends and Family: More than three times a week    Attends Religious Services: 1 to 4 times per year    Active Member of [employer] or Organizations: No    Attends Banker Meetings: Never    Marital Status: Divorced  Catering manager Violence: Not At Risk (09/29/2022)   Humiliation, Afraid, Rape, and Kick questionnaire    Fear of Current or Ex-Partner: No    Emotionally Abused: No    Physically Abused: No    Sexually Abused: No     Review of Systems  Gastrointestinal:  Positive for constipation.  All other systems reviewed and are negative.      Objective:   Physical Exam Vitals reviewed.  Constitutional:      General: She is not in acute distress.    Appearance: Normal appearance. She is normal weight. She is not ill-appearing or toxic-appearing.  HENT:     Mouth/Throat:     Mouth: Mucous membranes are moist.     Pharynx: Oropharynx is clear. No oropharyngeal exudate or posterior oropharyngeal erythema.  Eyes:     Extraocular Movements: Extraocular movements intact.     Pupils: Pupils are equal, round, and reactive to light.  Neck:     Vascular: No carotid bruit.  Cardiovascular:     Rate and Rhythm: Normal rate. Rhythm irregular.     Pulses: Normal pulses.     Heart sounds: Normal heart sounds. No murmur heard.    No friction rub. No gallop.  Pulmonary:     Effort: Pulmonary effort is normal. No tachypnea, accessory muscle usage or respiratory  distress.     Breath sounds: Normal breath sounds. No stridor. No wheezing, rhonchi or rales.  Abdominal:     General: Abdomen is flat. Bowel sounds are normal. There is no distension.     Palpations: Abdomen is soft.     Tenderness: There is no abdominal tenderness. There is no guarding or rebound.  Musculoskeletal:     Right lower leg: No edema.     Left lower leg: No edema.  Skin:    Findings: Lesion present.  Neurological:     General: No focal deficit present.     Mental Status: She is alert and oriented to person, place, and time. Mental status is at baseline.     Cranial Nerves: No cranial nerve deficit.     Motor: No weakness.     Coordination: Coordination normal.  Gait: Gait normal.  Psychiatric:        Mood and Affect: Mood normal.        Behavior: Behavior normal.        Thought Content: Thought content normal.        Judgment: Judgment normal.           Assessment & Plan:  Dysuria - Plan: Urinalysis, Routine w reflex microscopic, Urine Culture Send urine culture and sensitivities.  Meanwhile discontinue Bactrim and start Cipro 500 g twice daily for 7 days for resistant UTI.  Possible QTc prolongation she reduce amiodarone to 100 mg a day while taking Cipro.  Patient has already tried and failed Rocephin, Keflex, and Bactrim

## 2024-03-07 LAB — URINE CULTURE
MICRO NUMBER:: 16314213
Result:: NO GROWTH
SPECIMEN QUALITY:: ADEQUATE

## 2024-03-24 ENCOUNTER — Other Ambulatory Visit: Payer: Self-pay | Admitting: Family Medicine

## 2024-03-26 ENCOUNTER — Other Ambulatory Visit: Payer: Medicare Other

## 2024-03-26 ENCOUNTER — Other Ambulatory Visit

## 2024-03-26 DIAGNOSIS — I10 Essential (primary) hypertension: Secondary | ICD-10-CM | POA: Diagnosis not present

## 2024-03-26 DIAGNOSIS — Z13220 Encounter for screening for lipoid disorders: Secondary | ICD-10-CM

## 2024-03-26 DIAGNOSIS — R946 Abnormal results of thyroid function studies: Secondary | ICD-10-CM

## 2024-03-26 DIAGNOSIS — I4891 Unspecified atrial fibrillation: Secondary | ICD-10-CM

## 2024-03-26 LAB — CBC WITH DIFFERENTIAL/PLATELET
Absolute Lymphocytes: 1773 {cells}/uL (ref 850–3900)
Absolute Monocytes: 657 {cells}/uL (ref 200–950)
Basophils Absolute: 81 {cells}/uL (ref 0–200)
Basophils Relative: 0.9 %
Eosinophils Absolute: 333 {cells}/uL (ref 15–500)
Eosinophils Relative: 3.7 %
HCT: 39.3 % (ref 35.0–45.0)
Hemoglobin: 12.9 g/dL (ref 11.7–15.5)
MCH: 27.4 pg (ref 27.0–33.0)
MCHC: 32.8 g/dL (ref 32.0–36.0)
MCV: 83.6 fL (ref 80.0–100.0)
MPV: 11.1 fL (ref 7.5–12.5)
Monocytes Relative: 7.3 %
Neutro Abs: 6156 {cells}/uL (ref 1500–7800)
Neutrophils Relative %: 68.4 %
Platelets: 279 10*3/uL (ref 140–400)
RBC: 4.7 10*6/uL (ref 3.80–5.10)
RDW: 12.7 % (ref 11.0–15.0)
Total Lymphocyte: 19.7 %
WBC: 9 10*3/uL (ref 3.8–10.8)

## 2024-03-26 LAB — LIPID PANEL
Cholesterol: 191 mg/dL (ref <200)
HDL: 58 mg/dL (ref >=50)
LDL Cholesterol (Calc): 116 mg/dL — ABNORMAL HIGH
Non-HDL Cholesterol (Calc): 133 mg/dL — ABNORMAL HIGH (ref <130)
Total CHOL/HDL Ratio: 3.3 (calc) (ref <5.0)
Triglycerides: 78 mg/dL (ref <150)

## 2024-03-26 LAB — COMPLETE METABOLIC PANEL WITHOUT GFR
AG Ratio: 1.3 (calc) (ref 1.0–2.5)
ALT: 10 U/L (ref 6–29)
AST: 20 U/L (ref 10–35)
Albumin: 3.7 g/dL (ref 3.6–5.1)
Alkaline phosphatase (APISO): 85 U/L (ref 37–153)
BUN: 13 mg/dL (ref 7–25)
CO2: 30 mmol/L (ref 20–32)
Calcium: 9.6 mg/dL (ref 8.6–10.4)
Chloride: 100 mmol/L (ref 98–110)
Creat: 0.93 mg/dL (ref 0.60–1.00)
Globulin: 2.8 g/dL (ref 1.9–3.7)
Glucose, Bld: 83 mg/dL (ref 65–99)
Potassium: 5.3 mmol/L (ref 3.5–5.3)
Sodium: 140 mmol/L (ref 135–146)
Total Bilirubin: 0.5 mg/dL (ref 0.2–1.2)
Total Protein: 6.5 g/dL (ref 6.1–8.1)

## 2024-03-26 LAB — TSH: TSH: 0.52 m[IU]/L (ref 0.40–4.50)

## 2024-03-27 ENCOUNTER — Other Ambulatory Visit: Payer: Self-pay | Admitting: Family Medicine

## 2024-03-28 ENCOUNTER — Encounter: Payer: Self-pay | Admitting: Family Medicine

## 2024-03-28 ENCOUNTER — Ambulatory Visit (INDEPENDENT_AMBULATORY_CARE_PROVIDER_SITE_OTHER): Payer: Medicare Other | Admitting: Family Medicine

## 2024-03-28 VITALS — BP 120/82 | HR 68 | Temp 98.2°F | Ht 62.0 in | Wt 149.2 lb

## 2024-03-28 DIAGNOSIS — I10 Essential (primary) hypertension: Secondary | ICD-10-CM

## 2024-03-28 DIAGNOSIS — Z0000 Encounter for general adult medical examination without abnormal findings: Secondary | ICD-10-CM

## 2024-03-28 DIAGNOSIS — Z0001 Encounter for general adult medical examination with abnormal findings: Secondary | ICD-10-CM

## 2024-03-28 DIAGNOSIS — M8580 Other specified disorders of bone density and structure, unspecified site: Secondary | ICD-10-CM

## 2024-03-28 DIAGNOSIS — Z23 Encounter for immunization: Secondary | ICD-10-CM

## 2024-03-28 DIAGNOSIS — Z780 Asymptomatic menopausal state: Secondary | ICD-10-CM | POA: Diagnosis not present

## 2024-03-28 DIAGNOSIS — I4891 Unspecified atrial fibrillation: Secondary | ICD-10-CM | POA: Diagnosis not present

## 2024-03-28 NOTE — Progress Notes (Signed)
 Subjective:    Patient ID: Kristina Dougherty, female    DOB: Jun 02, 1946, 78 y.o.   MRN: [id number] Patient is an 78 year old Caucasian female here today for complete physical exam.  Patient is due for a booster on her shingles vaccine.  She had Zostavax more than 8 years ago.  Her pneumonia vaccines are up-to-date.  She is due for the RSV vaccine and also recommended an annual flu shot and an annual COVID shot in the fall.  The patient would like to get the shingles vaccine today.  Due to her age she does not require a Pap smear or colonoscopy.  She is overdue for mammogram.  We discussed this but she would like for scheduling this at the present time.  She is overdue for a bone density test.  She would like me to schedule that for her.  Her most recent lab work is listed below Lab on 03/26/2024  Component Date Value Ref Range Status   WBC 03/26/2024 9.0  3.8 - 10.8 Thousand/uL Final   RBC 03/26/2024 4.70  3.80 - 5.10 Million/uL Final   Hemoglobin 03/26/2024 12.9  11.7 - 15.5 g/dL Final   HCT 40/98/1191 39.3  35.0 - 45.0 % Final   MCV 03/26/2024 83.6  80.0 - 100.0 fL Final   MCH 03/26/2024 27.4  27.0 - 33.0 pg Final   MCHC 03/26/2024 32.8  32.0 - 36.0 g/dL Final   Comment: For adults, a slight decrease in the calculated MCHC value (in the range of 30 to 32 g/dL) is most likely not clinically significant; however, it should be interpreted with caution in correlation with other red cell parameters and the patient's clinical condition.    RDW 03/26/2024 12.7  11.0 - 15.0 % Final   Platelets 03/26/2024 279  140 - 400 Thousand/uL Final   MPV 03/26/2024 11.1  7.5 - 12.5 fL Final   Neutro Abs 03/26/2024 6,156  1,500 - 7,800 cells/uL Final   Absolute Lymphocytes 03/26/2024 1,773  850 - 3,900 cells/uL Final   Absolute Monocytes 03/26/2024 657  200 - 950 cells/uL Final   Eosinophils Absolute 03/26/2024 333  15 - 500 cells/uL Final   Basophils Absolute 03/26/2024 81  0 - 200 cells/uL Final    Neutrophils Relative % 03/26/2024 68.4  % Final   Total Lymphocyte 03/26/2024 19.7  % Final   Monocytes Relative 03/26/2024 7.3  % Final   Eosinophils Relative 03/26/2024 3.7  % Final   Basophils Relative 03/26/2024 0.9  % Final   Glucose, Bld 03/26/2024 83  65 - 99 mg/dL Final   Comment: .            Fasting reference interval .    BUN 03/26/2024 13  7 - 25 mg/dL Final   Creat 47/82/9562 0.93  0.60 - 1.00 mg/dL Final   BUN/Creatinine Ratio 03/26/2024 SEE NOTE:  6 - 22 (calc) Final   Comment:    Not Reported: BUN and Creatinine are within    reference range. .    Sodium 03/26/2024 140  135 - 146 mmol/L Final   Potassium 03/26/2024 5.3  3.5 - 5.3 mmol/L Final   Chloride 03/26/2024 100  98 - 110 mmol/L Final   CO2 03/26/2024 30  20 - 32 mmol/L Final   Calcium 03/26/2024 9.6  8.6 - 10.4 mg/dL Final   Total Protein 13/06/6577 6.5  6.1 - 8.1 g/dL Final   Albumin  03/26/2024 3.7  3.6 - 5.1 g/dL Final   Globulin 46/96/2952  2.8  1.9 - 3.7 g/dL (calc) Final   AG Ratio 03/26/2024 1.3  1.0 - 2.5 (calc) Final   Total Bilirubin 03/26/2024 0.5  0.2 - 1.2 mg/dL Final   Alkaline phosphatase (APISO) 03/26/2024 85  37 - 153 U/L Final   AST 03/26/2024 20  10 - 35 U/L Final   ALT 03/26/2024 10  6 - 29 U/L Final   Cholesterol 03/26/2024 191  <200 mg/dL Final   HDL 16/08/9603 58  > OR = 50 mg/dL Final   Triglycerides 54/07/8118 78  <150 mg/dL Final   LDL Cholesterol (Calc) 03/26/2024 116 (H)  mg/dL (calc) Final   Comment: Reference range: <100 . Desirable range <100 mg/dL for primary prevention;   <70 mg/dL for patients with CHD or diabetic patients  with > or = 2 CHD risk factors. Aaron Aas LDL-C is now calculated using the Martin-Hopkins  calculation, which is a validated novel method providing  better accuracy than the Friedewald equation in the  estimation of LDL-C.  Melinda Sprawls et al. Erroll Heard. 1478;295(62): 2061-2068  (http://education.[facility].com/faq/FAQ164)    Total CHOL/HDL Ratio  03/26/2024 3.3  <5.0 (calc) Final   Non-HDL Cholesterol (Calc) 03/26/2024 133 (H)  <130 mg/dL (calc) Final   Comment: For patients with diabetes plus 1 major ASCVD risk  factor, treating to a non-HDL-C goal of <100 mg/dL  (LDL-C of <13 mg/dL) is considered a therapeutic  option.    TSH 03/26/2024 0.52  0.40 - 4.50 mIU/L Final    Past Medical History:  Diagnosis Date   Anxiety    Arthritis    Atrial fibrillation (HCC)    Deaf, right    Depression    Hypertension    Osteopenia    Past Surgical History:  Procedure Laterality Date   ABDOMINAL HYSTERECTOMY     APPENDECTOMY     ATRIAL FIBRILLATION ABLATION N/A 09/28/2022   Procedure: ATRIAL FIBRILLATION ABLATION;  Surgeon: Lei Pump, MD;  Location: [facility] INVASIVE CV LAB;  Service: Cardiovascular;  Laterality: N/A;   BUBBLE STUDY  01/09/2022   Procedure: BUBBLE STUDY;  Surgeon: Maudine Sos, MD;  Location: [facility] ENDOSCOPY;  Service: Cardiovascular;;   CARDIOVERSION N/A 01/09/2022   Procedure: CARDIOVERSION;  Surgeon: Maudine Sos, MD;  Location: [facility] ENDOSCOPY;  Service: Cardiovascular;  Laterality: N/A;   CARDIOVERSION N/A 01/20/2022   Procedure: CARDIOVERSION;  Surgeon: Hugh Madura, MD;  Location: [facility] ENDOSCOPY;  Service: Cardiovascular;  Laterality: N/A;   CARDIOVERSION N/A 04/05/2022   Procedure: CARDIOVERSION;  Surgeon: Hugh Madura, MD;  Location: [facility] ENDOSCOPY;  Service: Cardiovascular;  Laterality: N/A;   TEE WITHOUT CARDIOVERSION N/A 01/09/2022   Procedure: TRANSESOPHAGEAL ECHOCARDIOGRAM (TEE);  Surgeon: Maudine Sos, MD;  Location: [facility] ENDOSCOPY;  Service: Cardiovascular;  Laterality: N/A;   TEE WITHOUT CARDIOVERSION N/A 01/20/2022   Procedure: TRANSESOPHAGEAL ECHOCARDIOGRAM (TEE);  Surgeon: Hugh Madura, MD;  Location: [facility] ENDOSCOPY;  Service: Cardiovascular;  Laterality: N/A;   Current Outpatient Medications on File Prior to Visit  Medication Sig Dispense Refill   acetaminophen  (TYLENOL ) 500 MG tablet Take  500 mg by mouth every 6 (six) hours as needed (pain.).     ALPRAZolam  (XANAX ) 0.5 MG tablet TAKE 1 TABLET BY MOUTH THREE TIMES DAILY AS NEEDED FOR ANXIETY 30 tablet 0   amiodarone  (PACERONE ) 200 MG tablet Take 1 tablet (200 mg total) by mouth daily. No further fills without appt [id number] 30 tablet 0   apixaban  (ELIQUIS ) 5 MG TABS tablet Take 1 tablet (5 mg total) by mouth 2 (  two) times daily. 180 tablet 3   clobetasol  cream (TEMOVATE ) 0.05 % APPLY ONE APPLICATION TOPICALLY TWO TIMES DAILY 30 g 3   furosemide  (LASIX ) 40 MG tablet Take 1 tablet (40 mg total) by mouth daily. Appt req for any further fills [id number] 15 tablet 0   losartan  (COZAAR ) 50 MG tablet TAKE 1 TABLET BY MOUTH ONCE  DAILY 100 tablet 2   ondansetron  (ZOFRAN ) 4 MG tablet Take 1 tablet (4 mg total) by mouth every 6 (six) hours as needed for nausea. 20 tablet 0   pantoprazole  (PROTONIX ) 40 MG tablet TAKE 1 TABLET BY MOUTH DAILY 30 tablet 0   PARoxetine  (PAXIL ) 20 MG tablet TAKE 1 TABLET BY MOUTH TWICE  DAILY 180 tablet 3   phenazopyridine  (PYRIDIUM ) 95 MG tablet Take 1 tablet (95 mg total) by mouth 3 (three) times daily as needed for pain. 10 tablet 0   polyethylene glycol (MIRALAX  / GLYCOLAX ) 17 g packet Take 17 g by mouth daily. (Patient taking differently: Take 17 g by mouth daily as needed for moderate constipation.) 14 each 0   potassium chloride  SA (KLOR-CON  M) 20 MEQ tablet Take 1 tablet (20 mEq total) by mouth daily. Appointment Required For Further Refills [phone] 30 tablet 0   No current facility-administered medications on file prior to visit.   Allergies  Allergen Reactions   Morphine  And Codeine     Severe HA   Nitrofurantoin Nausea And Vomiting   Social History   Socioeconomic History   Marital status: Divorced    Spouse name: Not on file   Number of children: Not on file   Years of education: Not on file   Highest education level: Not on file  Occupational History   Not on file  Tobacco Use    Smoking status: Former    Current packs/day: 0.00    Types: Cigarettes    Quit date: 08/27/2021    Years since quitting: 2.5   Smokeless tobacco: Never   Tobacco comments:    Former smoker 10/24/22.  Substance and Sexual Activity   Alcohol use: No   Drug use: No   Sexual activity: Never  Other Topics Concern   Not on file  Social History Narrative   Not on file   Social Drivers of Health   Financial Resource Strain: Low Risk  (01/18/2023)   Overall Financial Resource Strain (CARDIA)    Difficulty of Paying Living Expenses: Not very hard  Food Insecurity: No Food Insecurity (01/18/2023)   Hunger Vital Sign    Worried About Running Out of Food in the Last Year: Never true    Ran Out of Food in the Last Year: Never true  Transportation Needs: No Transportation Needs (09/29/2022)   PRAPARE - Administrator, Civil Service (Medical): No    Lack of Transportation (Non-Medical): No  Physical Activity: Sufficiently Active (03/23/2022)   Exercise Vital Sign    Days of Exercise per Week: 4 days    Minutes of Exercise per Session: 40 min  Stress: No Stress Concern Present (03/23/2022)   [employer] of Occupational Health - Occupational Stress Questionnaire    Feeling of Stress : Not at all  Social Connections: Moderately Isolated (03/23/2022)   Social Connection and Isolation Panel [NHANES]    Frequency of Communication with Friends and Family: More than three times a week    Frequency of Social Gatherings with Friends and Family: More than three times a week    Attends Religious Services:  1 to 4 times per year    Active Member of Clubs or Organizations: No    Attends Banker Meetings: Never    Marital Status: Divorced  Catering manager Violence: Not At Risk (09/29/2022)   Humiliation, Afraid, Rape, and Kick questionnaire    Fear of Current or Ex-Partner: No    Emotionally Abused: No    Physically Abused: No    Sexually Abused: No     Review of Systems   Gastrointestinal:  Positive for constipation.  All other systems reviewed and are negative.      Objective:   Physical Exam Vitals reviewed.  Constitutional:      General: She is not in acute distress.    Appearance: Normal appearance. She is normal weight. She is not ill-appearing or toxic-appearing.  HENT:     Right Ear: There is impacted cerumen.     Left Ear: There is impacted cerumen.     Nose: Nose normal. No congestion or rhinorrhea.      Mouth/Throat:     Mouth: Mucous membranes are moist.     Pharynx: Oropharynx is clear. No oropharyngeal exudate or posterior oropharyngeal erythema.  Eyes:     Extraocular Movements: Extraocular movements intact.     Pupils: Pupils are equal, round, and reactive to light.  Neck:     Vascular: No carotid bruit.  Cardiovascular:     Rate and Rhythm: Normal rate. Rhythm irregular.     Pulses: Normal pulses.     Heart sounds: Normal heart sounds. No murmur heard.    No friction rub. No gallop.  Pulmonary:     Effort: Pulmonary effort is normal. No tachypnea, accessory muscle usage or respiratory distress.     Breath sounds: Normal breath sounds. No stridor. No wheezing, rhonchi or rales.  Abdominal:     General: Abdomen is flat. Bowel sounds are normal. There is no distension.     Palpations: Abdomen is soft.     Tenderness: There is no abdominal tenderness. There is no guarding or rebound.  Musculoskeletal:     Right lower leg: No edema.     Left lower leg: No edema.  Skin:    Findings: Lesion present.  Neurological:     General: No focal deficit present.     Mental Status: She is alert and oriented to person, place, and time. Mental status is at baseline.     Cranial Nerves: No cranial nerve deficit.     Motor: No weakness.     Coordination: Coordination normal.     Gait: Gait normal.  Psychiatric:        Mood and Affect: Mood normal.        Behavior: Behavior normal.        Thought Content: Thought content normal.         Judgment: Judgment normal.           Assessment & Plan:  Postmenopausal estrogen deficiency - Plan: DG Bone Density  Atrial fibrillation, unspecified type (HCC)  Osteopenia, unspecified location  Essential hypertension  General medical exam I am very happy with her blood pressure today.  Her lab work looks excellent.  Patient received the shingles vaccine.  We discussed the RSV vaccine as well as an annual flu shot and an annual COVID shot but she defers these at the present time.  I will schedule the patient for a bone density test.  She declines a mammogram at the present time.  She does not  require a Pap smear or colonoscopy due to her age.  Regular anticipatory guidance is provided.

## 2024-03-29 NOTE — Telephone Encounter (Signed)
 Requested medication (s) are due for refill today: yes  Requested medication (s) are on the active medication list: yes  Last refill:  01/25/23 with 3 refills   Future visit scheduled: yes, last visit on 5/2  Notes to clinic:  Please review for refill. Unable to delegate refill per protocol    Requested Prescriptions  Pending Prescriptions Disp Refills   clobetasol  cream (TEMOVATE ) 0.05 % [Pharmacy Med Name: Clobetasol  Propionate 0.05 % External Cream] 30 g 0    Sig: APPLY  CREAM TOPICALLY TO AFFECTED AREA TWICE DAILY     Not Delegated - Dermatology:  Corticosteroids Failed - 03/29/2024  3:07 PM      Failed - This refill cannot be delegated      Passed - Valid encounter within last 12 months    Recent Outpatient Visits           Yesterday Postmenopausal estrogen deficiency   [facility] Austine Lefort, MD   3 weeks ago Dysuria   [facility] Austine Lefort, MD   1 year ago Dysuria   [facility] Pickard, Cisco Crest, MD

## 2024-03-31 NOTE — Addendum Note (Signed)
 Addended by: Gillermo Lack K on: 03/31/2024 04:40 PM   Modules accepted: Orders

## 2024-04-03 ENCOUNTER — Other Ambulatory Visit (HOSPITAL_COMMUNITY): Payer: Self-pay | Admitting: Physician Assistant

## 2024-04-03 ENCOUNTER — Other Ambulatory Visit (HOSPITAL_COMMUNITY): Payer: Self-pay | Admitting: Cardiology

## 2024-04-03 NOTE — Telephone Encounter (Signed)
 AFib clinic sent in 15 tab on 4/8 and said pt would need to be seen prior to further refills.  So refill denied at this time.

## 2024-04-03 NOTE — Telephone Encounter (Signed)
 Dr. Adell Age pt. Last OV with Dr. Lawana Pray was on 06/05/22 with a F/U with  on 10/24/22 with Dr. Lujean Sake. F/U "as scheduled" with Dr. Lawana Pray. She has had 1 provider cancellation and 1 No-Show. Please advise.

## 2024-04-14 ENCOUNTER — Other Ambulatory Visit: Payer: Self-pay | Admitting: Family Medicine

## 2024-04-14 NOTE — Telephone Encounter (Signed)
 Prescription Request  04/14/2024  LOV: 03/28/2024  What is the name of the medication or equipment? furosemide  (LASIX ) 40 MG tablet  and amiodarone  (PACERONE ) 200 MG tablet   Have you contacted your pharmacy to request a refill? Yes   Which pharmacy would you like this sent to?  [facility] - [place] - [address] [place] [address] Phone: [phone] Fax: [phone]    Patient notified that their request is being sent to the clinical staff for review and that they should receive a response within 2 business days.   Please advise at [facility] [phone]

## 2024-04-16 NOTE — Telephone Encounter (Signed)
 Requested medication (s) are due for refill today: yes  Requested medication (s) are on the active medication list: yes  Last refill:  03/04/24  Future visit scheduled: yes  Notes to clinic:  Unable to refill per protocol, last refill by ED provider. Routing to pcp for approval.     Requested Prescriptions  Pending Prescriptions Disp Refills   furosemide  (LASIX ) 40 MG tablet 15 tablet 0    Sig: Take 1 tablet (40 mg total) by mouth daily. Appt req for any further fills [id number]     Cardiovascular:  Diuretics - Loop Failed - 04/16/2024 11:19 AM      Failed - Mg Level in normal range and within 180 days    Magnesium   Date Value Ref Range Status  02/09/2022 2.1 1.5 - 2.5 mg/dL Final         Passed - K in normal range and within 180 days    Potassium  Date Value Ref Range Status  03/26/2024 5.3 3.5 - 5.3 mmol/L Final         Passed - Ca in normal range and within 180 days    Calcium  Date Value Ref Range Status  03/26/2024 9.6 8.6 - 10.4 mg/dL Final   Calcium, Ion  Date Value Ref Range Status  03/19/2012 1.14 1.12 - 1.32 mmol/L Final         Passed - Na in normal range and within 180 days    Sodium  Date Value Ref Range Status  03/26/2024 140 135 - 146 mmol/L Final  09/07/2022 143 134 - 144 mmol/L Final         Passed - Cr in normal range and within 180 days    Creat  Date Value Ref Range Status  03/26/2024 0.93 0.60 - 1.00 mg/dL Final         Passed - Cl in normal range and within 180 days    Chloride  Date Value Ref Range Status  03/26/2024 100 98 - 110 mmol/L Final         Passed - Last BP in normal range    BP Readings from Last 1 Encounters:  03/28/24 120/82         Passed - Valid encounter within last 6 months    Recent Outpatient Visits           2 weeks ago Postmenopausal estrogen deficiency   [place] [facility] Austine Lefort, MD   1 month ago Dysuria   [facility] Austine Lefort, MD    1 year ago Dysuria   [facility] Austine Lefort, MD               amiodarone  (PACERONE ) 200 MG tablet 30 tablet 0    Sig: Take 1 tablet (200 mg total) by mouth daily. No further fills without appt [id number]     Not Delegated - Cardiovascular: Antiarrhythmic Agents - amiodarone  Failed - 04/16/2024 11:19 AM      Failed - This refill cannot be delegated      Failed - Manual Review: Eye exam recommended every 12 months      Failed - Mg Level in normal range and within 360 days    Magnesium   Date Value Ref Range Status  02/09/2022 2.1 1.5 - 2.5 mg/dL Final         Failed - Patient had ECG in the last 180 days  Failed - Patient had chest x-ray within the last 6 months      Passed - TSH in normal range and within 360 days    TSH  Date Value Ref Range Status  03/26/2024 0.52 0.40 - 4.50 mIU/L Final         Passed - K in normal range and within 180 days    Potassium  Date Value Ref Range Status  03/26/2024 5.3 3.5 - 5.3 mmol/L Final         Passed - AST in normal range and within 180 days    AST  Date Value Ref Range Status  03/26/2024 20 10 - 35 U/L Final         Passed - ALT in normal range and within 180 days    ALT  Date Value Ref Range Status  03/26/2024 10 6 - 29 U/L Final         Passed - Patient is not pregnant      Passed - Last BP in normal range    BP Readings from Last 1 Encounters:  03/28/24 120/82         Passed - Last Heart Rate in normal range    Pulse Readings from Last 1 Encounters:  03/28/24 68         Passed - Valid encounter within last 6 months    Recent Outpatient Visits           2 weeks ago Postmenopausal estrogen deficiency   [facility] Pickard, Cisco Crest, MD   1 month ago Dysuria   [facility] Austine Lefort, MD   1 year ago Dysuria   [place] [facility] Pickard, Cisco Crest, MD

## 2024-04-18 ENCOUNTER — Telehealth: Payer: Self-pay

## 2024-04-18 ENCOUNTER — Other Ambulatory Visit: Payer: Self-pay

## 2024-04-18 DIAGNOSIS — I5021 Acute systolic (congestive) heart failure: Secondary | ICD-10-CM

## 2024-04-18 DIAGNOSIS — I4891 Unspecified atrial fibrillation: Secondary | ICD-10-CM

## 2024-04-18 DIAGNOSIS — I4819 Other persistent atrial fibrillation: Secondary | ICD-10-CM

## 2024-04-18 DIAGNOSIS — I10 Essential (primary) hypertension: Secondary | ICD-10-CM

## 2024-04-18 MED ORDER — AMIODARONE HCL 200 MG PO TABS: 200.0000 mg | ORAL_TABLET | Freq: Every day | ORAL | 3 refills | Status: DC

## 2024-04-18 MED ORDER — FUROSEMIDE 40 MG PO TABS
40.0000 mg | ORAL_TABLET | Freq: Every day | ORAL | 0 refills | Status: DC
Start: 2024-04-18 — End: 2024-05-22

## 2024-04-18 NOTE — Telephone Encounter (Signed)
 Copied from CRM [id number]. Topic: Clinical - Prescription Issue >> Apr 18, 2024  3:39 PM Bridgette Campus T wrote: Reason for CRM: amiodarone  (PACERONE ) 200 MG tablet and furosemide  (LASIX ) 40 MG tablet - patient is completely out of both medications for the past week- [phone]

## 2024-05-03 ENCOUNTER — Other Ambulatory Visit: Payer: Self-pay | Admitting: Family Medicine

## 2024-05-03 DIAGNOSIS — I4819 Other persistent atrial fibrillation: Secondary | ICD-10-CM

## 2024-05-03 DIAGNOSIS — I10 Essential (primary) hypertension: Secondary | ICD-10-CM

## 2024-05-03 DIAGNOSIS — I5021 Acute systolic (congestive) heart failure: Secondary | ICD-10-CM

## 2024-05-03 DIAGNOSIS — I4891 Unspecified atrial fibrillation: Secondary | ICD-10-CM

## 2024-05-22 ENCOUNTER — Other Ambulatory Visit: Payer: Self-pay | Admitting: Family Medicine

## 2024-05-22 DIAGNOSIS — I4819 Other persistent atrial fibrillation: Secondary | ICD-10-CM

## 2024-05-22 DIAGNOSIS — I4891 Unspecified atrial fibrillation: Secondary | ICD-10-CM

## 2024-05-22 DIAGNOSIS — I10 Essential (primary) hypertension: Secondary | ICD-10-CM

## 2024-05-22 DIAGNOSIS — I5021 Acute systolic (congestive) heart failure: Secondary | ICD-10-CM

## 2024-05-22 NOTE — Telephone Encounter (Signed)
 Copied from CRM [id number]. Topic: Clinical - Medication Refill >> May 22, 2024  2:28 PM Sasha H wrote: Medication: furosemide  (LASIX ) 40 MG tablet amiodarone  (PACERONE ) 200 MG tablet ALPRAZolam  (XANAX ) 0.5 MG tablet  Has the patient contacted their pharmacy? Yes (Agent: If no, request that the patient contact the pharmacy for the refill. If patient does not wish to contact the pharmacy document the reason why and proceed with request.) (Agent: If yes, when and what did the pharmacy advise?)  This is the patient's preferred pharmacy:  [facility] - [place], [place] - [address] [place] [address] Phone: [phone] Fax: [phone]  Is this the correct pharmacy for this prescription? Yes If no, delete pharmacy and type the correct one.   Has the prescription been filled recently? Yes  Is the patient out of the medication? No  Has the patient been seen for an appointment in the last year OR does the patient have an upcoming appointment? Yes  Can we respond through MyChart? No  Agent: Please be advised that Rx refills may take up to 3 business days. We ask that you follow-up with your pharmacy.

## 2024-05-23 ENCOUNTER — Other Ambulatory Visit: Payer: Self-pay

## 2024-05-23 MED ORDER — FUROSEMIDE 40 MG PO TABS: 40.0000 mg | ORAL_TABLET | Freq: Every day | ORAL | 0 refills | Status: DC

## 2024-05-23 MED ORDER — ALPRAZOLAM 0.5 MG PO TABS: 0.5000 mg | ORAL_TABLET | Freq: Three times a day (TID) | ORAL | 0 refills | Status: DC | PRN

## 2024-05-23 NOTE — Telephone Encounter (Signed)
 Requested medication (s) are due for refill today: only xanax   Requested medication (s) are on the active medication list: yes  Last refill:  xanax : 03/06/24         amiodarone : 04/18/24  Future visit scheduled: no  Notes to clinic:  neither med delegated to NT    Requested Prescriptions  Pending Prescriptions Disp Refills   ALPRAZolam  (XANAX ) 0.5 MG tablet 30 tablet     Sig: Take 1 tablet (0.5 mg total) by mouth 3 (three) times daily as needed. for anxiety     Not Delegated - Psychiatry: Anxiolytics/Hypnotics 2 Failed - 05/23/2024  2:20 PM      Failed - This refill cannot be delegated      Failed - Urine Drug Screen completed in last 360 days      Passed - Patient is not pregnant      Passed - Valid encounter within last 6 months    Recent Outpatient Visits           1 month ago Postmenopausal estrogen deficiency   [place] [facility] Pickard, Butler DASEN, MD   2 months ago Dysuria   [place] [facility] Duanne Butler DASEN, MD   1 year ago Dysuria   [place] [facility] Duanne Butler DASEN, MD               amiodarone  (PACERONE ) 200 MG tablet 30 tablet     Sig: Take 1 tablet (200 mg total) by mouth daily.     Not Delegated - Cardiovascular: Antiarrhythmic Agents - amiodarone  Failed - 05/23/2024  2:20 PM      Failed - This refill cannot be delegated      Failed - Manual Review: Eye exam recommended every 12 months      Failed - Mg Level in normal range and within 360 days    Magnesium   Date Value Ref Range Status  02/09/2022 2.1 1.5 - 2.5 mg/dL Final         Failed - Patient had ECG in the last 180 days      Failed - Patient had chest x-ray within the last 6 months      Passed - TSH in normal range and within 360 days    TSH  Date Value Ref Range Status  03/26/2024 0.52 0.40 - 4.50 mIU/L Final         Passed - K in normal range and within 180 days    Potassium  Date Value Ref Range Status   03/26/2024 5.3 3.5 - 5.3 mmol/L Final         Passed - AST in normal range and within 180 days    AST  Date Value Ref Range Status  03/26/2024 20 10 - 35 U/L Final         Passed - ALT in normal range and within 180 days    ALT  Date Value Ref Range Status  03/26/2024 10 6 - 29 U/L Final         Passed - Patient is not pregnant      Passed - Last BP in normal range    BP Readings from Last 1 Encounters:  03/28/24 120/82         Passed - Last Heart Rate in normal range    Pulse Readings from Last 1 Encounters:  03/28/24 68         Passed - Valid encounter within last 6  months    Recent Outpatient Visits           1 month ago Postmenopausal estrogen deficiency   [place] [facility] Pickard, Butler DASEN, MD   2 months ago Dysuria   [place] [facility] Duanne, Butler DASEN, MD   1 year ago Dysuria   [place] [facility] Duanne Butler DASEN, MD              Signed Prescriptions Disp Refills   furosemide  (LASIX ) 40 MG tablet 30 tablet 0    Sig: Take 1 tablet (40 mg total) by mouth daily.     Cardiovascular:  Diuretics - Loop Failed - 05/23/2024  2:20 PM      Failed - Mg Level in normal range and within 180 days    Magnesium   Date Value Ref Range Status  02/09/2022 2.1 1.5 - 2.5 mg/dL Final         Passed - K in normal range and within 180 days    Potassium  Date Value Ref Range Status  03/26/2024 5.3 3.5 - 5.3 mmol/L Final         Passed - Ca in normal range and within 180 days    Calcium  Date Value Ref Range Status  03/26/2024 9.6 8.6 - 10.4 mg/dL Final   Calcium, Ion  Date Value Ref Range Status  03/19/2012 1.14 1.12 - 1.32 mmol/L Final         Passed - Na in normal range and within 180 days    Sodium  Date Value Ref Range Status  03/26/2024 140 135 - 146 mmol/L Final  09/07/2022 143 134 - 144 mmol/L Final         Passed - Cr in normal range and within 180 days    Creat  Date Value Ref  Range Status  03/26/2024 0.93 0.60 - 1.00 mg/dL Final         Passed - Cl in normal range and within 180 days    Chloride  Date Value Ref Range Status  03/26/2024 100 98 - 110 mmol/L Final         Passed - Last BP in normal range    BP Readings from Last 1 Encounters:  03/28/24 120/82         Passed - Valid encounter within last 6 months    Recent Outpatient Visits           1 month ago Postmenopausal estrogen deficiency   [place] [facility] Duanne Butler DASEN, MD   2 months ago Dysuria   [place] [facility] Duanne Butler DASEN, MD   1 year ago Dysuria   [place] [facility] Pickard, Butler DASEN, MD

## 2024-05-23 NOTE — Telephone Encounter (Signed)
 Requested Prescriptions  Pending Prescriptions Disp Refills   ALPRAZolam  (XANAX ) 0.5 MG tablet 30 tablet     Sig: Take 1 tablet (0.5 mg total) by mouth 3 (three) times daily as needed. for anxiety     Not Delegated - Psychiatry: Anxiolytics/Hypnotics 2 Failed - 05/23/2024  2:19 PM      Failed - This refill cannot be delegated      Failed - Urine Drug Screen completed in last 360 days      Passed - Patient is not pregnant      Passed - Valid encounter within last 6 months    Recent Outpatient Visits           1 month ago Postmenopausal estrogen deficiency   [place] [facility] Pickard, Butler DASEN, MD   2 months ago Dysuria   [place] [facility] Duanne Butler DASEN, MD   1 year ago Dysuria   [place] [facility] Duanne Butler DASEN, MD               amiodarone  (PACERONE ) 200 MG tablet 30 tablet     Sig: Take 1 tablet (200 mg total) by mouth daily.     Not Delegated - Cardiovascular: Antiarrhythmic Agents - amiodarone  Failed - 05/23/2024  2:19 PM      Failed - This refill cannot be delegated      Failed - Manual Review: Eye exam recommended every 12 months      Failed - Mg Level in normal range and within 360 days    Magnesium   Date Value Ref Range Status  02/09/2022 2.1 1.5 - 2.5 mg/dL Final         Failed - Patient had ECG in the last 180 days      Failed - Patient had chest x-ray within the last 6 months      Passed - TSH in normal range and within 360 days    TSH  Date Value Ref Range Status  03/26/2024 0.52 0.40 - 4.50 mIU/L Final         Passed - K in normal range and within 180 days    Potassium  Date Value Ref Range Status  03/26/2024 5.3 3.5 - 5.3 mmol/L Final         Passed - AST in normal range and within 180 days    AST  Date Value Ref Range Status  03/26/2024 20 10 - 35 U/L Final         Passed - ALT in normal range and within 180 days    ALT  Date Value Ref Range Status  03/26/2024  10 6 - 29 U/L Final         Passed - Patient is not pregnant      Passed - Last BP in normal range    BP Readings from Last 1 Encounters:  03/28/24 120/82         Passed - Last Heart Rate in normal range    Pulse Readings from Last 1 Encounters:  03/28/24 68         Passed - Valid encounter within last 6 months    Recent Outpatient Visits           1 month ago Postmenopausal estrogen deficiency   [place] [facility] Duanne Butler DASEN, MD   2 months ago Dysuria   [place] [facility] Pickard, Butler DASEN, MD  1 year ago Dysuria   [place] [facility] Pickard, Butler DASEN, MD               furosemide  (LASIX ) 40 MG tablet 30 tablet 0    Sig: Take 1 tablet (40 mg total) by mouth daily.     Cardiovascular:  Diuretics - Loop Failed - 05/23/2024  2:19 PM      Failed - Mg Level in normal range and within 180 days    Magnesium   Date Value Ref Range Status  02/09/2022 2.1 1.5 - 2.5 mg/dL Final         Passed - K in normal range and within 180 days    Potassium  Date Value Ref Range Status  03/26/2024 5.3 3.5 - 5.3 mmol/L Final         Passed - Ca in normal range and within 180 days    Calcium  Date Value Ref Range Status  03/26/2024 9.6 8.6 - 10.4 mg/dL Final   Calcium, Ion  Date Value Ref Range Status  03/19/2012 1.14 1.12 - 1.32 mmol/L Final         Passed - Na in normal range and within 180 days    Sodium  Date Value Ref Range Status  03/26/2024 140 135 - 146 mmol/L Final  09/07/2022 143 134 - 144 mmol/L Final         Passed - Cr in normal range and within 180 days    Creat  Date Value Ref Range Status  03/26/2024 0.93 0.60 - 1.00 mg/dL Final         Passed - Cl in normal range and within 180 days    Chloride  Date Value Ref Range Status  03/26/2024 100 98 - 110 mmol/L Final         Passed - Last BP in normal range    BP Readings from Last 1 Encounters:  03/28/24 120/82         Passed -  Valid encounter within last 6 months    Recent Outpatient Visits           1 month ago Postmenopausal estrogen deficiency   [place] [facility] Duanne Butler DASEN, MD   2 months ago Dysuria   [place] [facility] Duanne Butler DASEN, MD   1 year ago Dysuria   [place] [facility] Pickard, Butler DASEN, MD

## 2024-06-06 ENCOUNTER — Other Ambulatory Visit: Payer: Self-pay | Admitting: Family Medicine

## 2024-06-06 DIAGNOSIS — I4819 Other persistent atrial fibrillation: Secondary | ICD-10-CM

## 2024-06-06 DIAGNOSIS — I5021 Acute systolic (congestive) heart failure: Secondary | ICD-10-CM

## 2024-06-06 DIAGNOSIS — I4891 Unspecified atrial fibrillation: Secondary | ICD-10-CM

## 2024-06-06 DIAGNOSIS — I10 Essential (primary) hypertension: Secondary | ICD-10-CM

## 2024-06-09 NOTE — Telephone Encounter (Signed)
 Requested Prescriptions  Pending Prescriptions Disp Refills   furosemide  (LASIX ) 40 MG tablet [Pharmacy Med Name: Furosemide  40 MG Oral Tablet] 90 tablet 1    Sig: TAKE 1 TABLET BY MOUTH DAILY     Cardiovascular:  Diuretics - Loop Failed - 06/09/2024  2:40 PM      Failed - Mg Level in normal range and within 180 days    Magnesium   Date Value Ref Range Status  02/09/2022 2.1 1.5 - 2.5 mg/dL Final         Passed - K in normal range and within 180 days    Potassium  Date Value Ref Range Status  03/26/2024 5.3 3.5 - 5.3 mmol/L Final         Passed - Ca in normal range and within 180 days    Calcium  Date Value Ref Range Status  03/26/2024 9.6 8.6 - 10.4 mg/dL Final   Calcium, Ion  Date Value Ref Range Status  03/19/2012 1.14 1.12 - 1.32 mmol/L Final         Passed - Na in normal range and within 180 days    Sodium  Date Value Ref Range Status  03/26/2024 140 135 - 146 mmol/L Final  09/07/2022 143 134 - 144 mmol/L Final         Passed - Cr in normal range and within 180 days    Creat  Date Value Ref Range Status  03/26/2024 0.93 0.60 - 1.00 mg/dL Final         Passed - Cl in normal range and within 180 days    Chloride  Date Value Ref Range Status  03/26/2024 100 98 - 110 mmol/L Final         Passed - Last BP in normal range    BP Readings from Last 1 Encounters:  03/28/24 120/82         Passed - Valid encounter within last 6 months    Recent Outpatient Visits           2 months ago Postmenopausal estrogen deficiency   [place] [facility] Duanne Butler DASEN, MD   3 months ago Dysuria   [place] [facility] Duanne Butler DASEN, MD   1 year ago Dysuria    [name] Pickard, Butler DASEN, MD

## 2024-06-23 ENCOUNTER — Other Ambulatory Visit: Payer: Self-pay | Admitting: Family Medicine

## 2024-06-23 DIAGNOSIS — I4819 Other persistent atrial fibrillation: Secondary | ICD-10-CM

## 2024-06-23 DIAGNOSIS — I4891 Unspecified atrial fibrillation: Secondary | ICD-10-CM

## 2024-06-23 DIAGNOSIS — I5021 Acute systolic (congestive) heart failure: Secondary | ICD-10-CM

## 2024-06-23 DIAGNOSIS — I10 Essential (primary) hypertension: Secondary | ICD-10-CM

## 2024-07-01 ENCOUNTER — Other Ambulatory Visit: Payer: Self-pay | Admitting: Family Medicine

## 2024-07-07 ENCOUNTER — Ambulatory Visit: Admitting: Family Medicine

## 2024-07-07 ENCOUNTER — Encounter: Payer: Self-pay | Admitting: Family Medicine

## 2024-07-07 ENCOUNTER — Telehealth: Payer: Self-pay

## 2024-07-07 VITALS — BP 130/86 | HR 67 | Temp 98.8°F | Ht 62.0 in | Wt 145.0 lb

## 2024-07-07 DIAGNOSIS — R300 Dysuria: Secondary | ICD-10-CM | POA: Diagnosis not present

## 2024-07-07 LAB — URINALYSIS, ROUTINE W REFLEX MICROSCOPIC
Bilirubin Urine: NEGATIVE
Glucose, UA: NEGATIVE
Hyaline Cast: NONE SEEN /LPF
Ketones, ur: NEGATIVE
Nitrite: NEGATIVE
Protein, ur: NEGATIVE
Specific Gravity, Urine: 1.01 (ref 1.001–1.035)
pH: 7 (ref 5.0–8.0)

## 2024-07-07 LAB — MICROSCOPIC MESSAGE

## 2024-07-07 MED ORDER — CIPROFLOXACIN HCL 500 MG PO TABS: 500.0000 mg | ORAL_TABLET | Freq: Two times a day (BID) | ORAL | 0 refills | Status: AC

## 2024-07-07 NOTE — Telephone Encounter (Signed)
 Copied from CRM #[id number]. Topic: Clinical - Medical Advice >> Jul 07, 2024  8:40 AM Myrick T wrote: Reason for CRM: patient needs a call as she thinks she has an UTI

## 2024-07-07 NOTE — Assessment & Plan Note (Signed)
 Urine dipstick shows positive for RBC's and positive for leukocytes.  Micro exam: 0-2 WBC's per HPF, 3-10 RBC's per HPF, and few+ bacteria. Start Cipro  500mg  BID x7d. Pt would like to defer treatment until culture returns. Discussed symptomatic management by pushing fluids and AZO. Follow up PRN

## 2024-07-07 NOTE — Progress Notes (Signed)
 Subjective:  HPI: Kristina Dougherty is a 78 y.o. female presenting on 07/07/2024 for No chief complaint on file.   HPI Patient is in today for burning with urination and hesitancy. She does have PMH of CKD. Denies fever, chills, body aches, hematuria, flank pain, or abdominal pain. Symptoms since Saturday. Has tried AZO. No recent antibiotics. Did have a UTI in April relieved with Cipro .  Review of Systems  All other systems reviewed and are negative.   Relevant past medical history reviewed and updated as indicated.   Past Medical History:  Diagnosis Date   Anxiety    Arthritis    Atrial fibrillation (HCC)    Deaf, right    Depression    Hypertension    Osteopenia      Past Surgical History:  Procedure Laterality Date   ABDOMINAL HYSTERECTOMY     APPENDECTOMY     ATRIAL FIBRILLATION ABLATION N/A 09/28/2022   Procedure: ATRIAL FIBRILLATION ABLATION;  Surgeon: Inocencio Soyla Lunger, MD;  Location: [facility] INVASIVE CV LAB;  Service: Cardiovascular;  Laterality: N/A;   BUBBLE STUDY  01/09/2022   Procedure: BUBBLE STUDY;  Surgeon: Raford Riggs, MD;  Location: [facility] ENDOSCOPY;  Service: Cardiovascular;;   CARDIOVERSION N/A 01/09/2022   Procedure: CARDIOVERSION;  Surgeon: Raford Riggs, MD;  Location: [facility] ENDOSCOPY;  Service: Cardiovascular;  Laterality: N/A;   CARDIOVERSION N/A 01/20/2022   Procedure: CARDIOVERSION;  Surgeon: Jeffrie Oneil BROCKS, MD;  Location: [facility] ENDOSCOPY;  Service: Cardiovascular;  Laterality: N/A;   CARDIOVERSION N/A 04/05/2022   Procedure: CARDIOVERSION;  Surgeon: Jeffrie Oneil BROCKS, MD;  Location: [facility] ENDOSCOPY;  Service: Cardiovascular;  Laterality: N/A;   TEE WITHOUT CARDIOVERSION N/A 01/09/2022   Procedure: TRANSESOPHAGEAL ECHOCARDIOGRAM (TEE);  Surgeon: Raford Riggs, MD;  Location: [facility] ENDOSCOPY;  Service: Cardiovascular;  Laterality: N/A;   TEE WITHOUT CARDIOVERSION N/A 01/20/2022   Procedure: TRANSESOPHAGEAL ECHOCARDIOGRAM (TEE);  Surgeon: Jeffrie Oneil BROCKS, MD;   Location: [facility] ENDOSCOPY;  Service: Cardiovascular;  Laterality: N/A;    Allergies and medications reviewed and updated.   Current Outpatient Medications:    acetaminophen  (TYLENOL ) 500 MG tablet, Take 500 mg by mouth every 6 (six) hours as needed (pain.)., Disp: , Rfl:    ALPRAZolam  (XANAX ) 0.5 MG tablet, Take 1 tablet (0.5 mg total) by mouth 3 (three) times daily as needed. for anxiety, Disp: 30 tablet, Rfl: 0   amiodarone  (PACERONE ) 200 MG tablet, TAKE 1 TABLET BY MOUTH DAILY, Disp: 100 tablet, Rfl: 2   ciprofloxacin  (CIPRO ) 500 MG tablet, Take 1 tablet (500 mg total) by mouth 2 (two) times daily for 7 days., Disp: 14 tablet, Rfl: 0   clobetasol  cream (TEMOVATE ) 0.05 %, APPLY  CREAM TOPICALLY TO AFFECTED AREA TWICE DAILY, Disp: 30 g, Rfl: 0   ELIQUIS  5 MG TABS tablet, Take 1 tablet by mouth twice daily, Disp: 180 tablet, Rfl: 0   furosemide  (LASIX ) 40 MG tablet, TAKE 1 TABLET BY MOUTH DAILY, Disp: 90 tablet, Rfl: 1   losartan  (COZAAR ) 50 MG tablet, TAKE 1 TABLET BY MOUTH ONCE  DAILY, Disp: 100 tablet, Rfl: 2   ondansetron  (ZOFRAN ) 4 MG tablet, Take 1 tablet (4 mg total) by mouth every 6 (six) hours as needed for nausea., Disp: 20 tablet, Rfl: 0   pantoprazole  (PROTONIX ) 40 MG tablet, TAKE 1 TABLET BY MOUTH DAILY, Disp: 30 tablet, Rfl: 0   PARoxetine  (PAXIL ) 20 MG tablet, TAKE 1 TABLET BY MOUTH TWICE  DAILY, Disp: 180 tablet, Rfl: 3   phenazopyridine  (PYRIDIUM ) 95 MG  tablet, Take 1 tablet (95 mg total) by mouth 3 (three) times daily as needed for pain., Disp: 10 tablet, Rfl: 0   polyethylene glycol (MIRALAX  / GLYCOLAX ) 17 g packet, Take 17 g by mouth daily. (Patient taking differently: Take 17 g by mouth daily as needed for moderate constipation.), Disp: 14 each, Rfl: 0   potassium chloride  SA (KLOR-CON  M) 20 MEQ tablet, Take 1 tablet (20 mEq total) by mouth daily. Appointment Required For Further Refills [phone], Disp: 30 tablet, Rfl: 0  Allergies  Allergen Reactions   Morphine  And  Codeine     Severe HA   Nitrofurantoin Nausea And Vomiting    Objective:   BP 130/86   Pulse 67   Temp 98.8 F (37.1 C)   Ht 5' 2 (1.575 m)   Wt 145 lb (65.8 kg)   SpO2 97%   BMI 26.52 kg/m      07/07/2024    2:50 PM 03/28/2024    3:07 PM 03/06/2024    2:55 PM  Vitals with BMI  Height 5' 2 5' 2 5' 2  Weight 145 lbs 149 lbs 3 oz 150 lbs  BMI 26.51 27.28 27.43  Systolic 130 120 857  Diastolic 86 82 90  Pulse 67 68 78     Physical Exam Vitals and nursing note reviewed.  Constitutional:      Appearance: Normal appearance. She is normal weight.  HENT:     Head: Normocephalic and atraumatic.  Abdominal:     General: Bowel sounds are normal. There is no distension.     Palpations: Abdomen is soft. There is no mass.     Tenderness: There is no right CVA tenderness or left CVA tenderness.  Skin:    General: Skin is warm and dry.  Neurological:     General: No focal deficit present.     Mental Status: She is alert and oriented to person, place, and time. Mental status is at baseline.  Psychiatric:        Mood and Affect: Mood normal.        Behavior: Behavior normal.        Thought Content: Thought content normal.        Judgment: Judgment normal.     Assessment & Plan:  Dysuria Assessment & Plan: Urine dipstick shows positive for RBC's and positive for leukocytes.  Micro exam: 0-2 WBC's per HPF, 3-10 RBC's per HPF, and few+ bacteria. Start Cipro  500mg  BID x7d. Pt would like to defer treatment until culture returns. Discussed symptomatic management by pushing fluids and AZO. Follow up PRN   Orders: -     Urine Culture -     Urinalysis, Routine w reflex microscopic  Other orders -     Ciprofloxacin  HCl; Take 1 tablet (500 mg total) by mouth 2 (two) times daily for 7 days.  Dispense: 14 tablet; Refill: 0 -     Microscopic Message     Follow up plan: Return if symptoms worsen or fail to improve.  Jeoffrey GORMAN Barrio, FNP

## 2024-07-08 LAB — URINE CULTURE
MICRO NUMBER:: 16813625
SPECIMEN QUALITY:: ADEQUATE

## 2024-07-17 ENCOUNTER — Telehealth: Payer: Self-pay

## 2024-07-17 NOTE — Telephone Encounter (Signed)
 Copied from CRM #[id number]. Topic: Clinical - Lab/Test Results >> Jul 17, 2024  2:42 PM Jasmin G wrote: Reason for CRM: Pt is requesting a phone call from clinic to discuss recent urine culture results, she states that she is taking med to treat currently but that is not working. Please call her back ASAP at [phone].

## 2024-07-23 ENCOUNTER — Telehealth: Payer: Self-pay

## 2024-07-23 ENCOUNTER — Other Ambulatory Visit: Payer: Self-pay

## 2024-07-23 DIAGNOSIS — L900 Lichen sclerosus et atrophicus: Secondary | ICD-10-CM

## 2024-07-23 MED ORDER — CLOBETASOL PROPIONATE 0.05 % EX CREA: TOPICAL_CREAM | CUTANEOUS | 1 refills | Status: AC

## 2024-07-23 NOTE — Telephone Encounter (Signed)
 Pt called in requesting a refill on her Xanax . Her last refill was 05/23/2024. Thanks.

## 2024-07-24 ENCOUNTER — Other Ambulatory Visit: Payer: Self-pay | Admitting: Family Medicine

## 2024-07-24 ENCOUNTER — Ambulatory Visit: Admitting: Family Medicine

## 2024-07-24 MED ORDER — ALPRAZOLAM 0.5 MG PO TABS: 0.5000 mg | ORAL_TABLET | Freq: Three times a day (TID) | ORAL | 2 refills | Status: DC | PRN

## 2024-08-24 ENCOUNTER — Other Ambulatory Visit: Payer: Self-pay | Admitting: Family Medicine

## 2024-08-24 DIAGNOSIS — I4819 Other persistent atrial fibrillation: Secondary | ICD-10-CM

## 2024-08-24 DIAGNOSIS — I10 Essential (primary) hypertension: Secondary | ICD-10-CM

## 2024-08-24 DIAGNOSIS — I4891 Unspecified atrial fibrillation: Secondary | ICD-10-CM

## 2024-08-24 DIAGNOSIS — I5021 Acute systolic (congestive) heart failure: Secondary | ICD-10-CM

## 2024-08-26 ENCOUNTER — Other Ambulatory Visit: Payer: Self-pay | Admitting: Family Medicine

## 2024-08-26 DIAGNOSIS — I10 Essential (primary) hypertension: Secondary | ICD-10-CM

## 2024-08-26 DIAGNOSIS — I5021 Acute systolic (congestive) heart failure: Secondary | ICD-10-CM

## 2024-08-26 DIAGNOSIS — I4891 Unspecified atrial fibrillation: Secondary | ICD-10-CM

## 2024-08-26 DIAGNOSIS — I4819 Other persistent atrial fibrillation: Secondary | ICD-10-CM

## 2024-08-26 NOTE — Telephone Encounter (Unsigned)
 Copied from CRM [id number]. Topic: Clinical - Medication Refill >> Aug 26, 2024  4:11 PM Debby BROCKS wrote: Medication: furosemide  (LASIX ) 40 MG tablet  Has the patient contacted their pharmacy? Yes (Agent: If no, request that the patient contact the pharmacy for the refill. If patient does not wish to contact the pharmacy document the reason why and proceed with request.) (Agent: If yes, when and what did the pharmacy advise?) Patient was advised that a new prescription needs to be sent out  This is the patient's preferred pharmacy:  [facility] [place] - [address] [place] [address] Phone: [phone] Fax: [phone]   Is this the correct pharmacy for this prescription? Yes If no, delete pharmacy and type the correct one.   Has the prescription been filled recently? No  Is the patient out of the medication? No  Has the patient been seen for an appointment in the last year OR does the patient have an upcoming appointment? Yes  Can we respond through MyChart? Yes  Agent: Please be advised that Rx refills may take up to 3 business days. We ask that you follow-up with your pharmacy.

## 2024-08-28 NOTE — Telephone Encounter (Signed)
 Too soon for refill, duplicate request.  Requested Prescriptions  Pending Prescriptions Disp Refills   furosemide  (LASIX ) 40 MG tablet 90 tablet 1    Sig: Take 1 tablet (40 mg total) by mouth daily.     Cardiovascular:  Diuretics - Loop Failed - 08/28/2024  1:41 PM      Failed - Mg Level in normal range and within 180 days    Magnesium   Date Value Ref Range Status  02/09/2022 2.1 1.5 - 2.5 mg/dL Final         Passed - K in normal range and within 180 days    Potassium  Date Value Ref Range Status  03/26/2024 5.3 3.5 - 5.3 mmol/L Final         Passed - Ca in normal range and within 180 days    Calcium  Date Value Ref Range Status  03/26/2024 9.6 8.6 - 10.4 mg/dL Final   Calcium, Ion  Date Value Ref Range Status  03/19/2012 1.14 1.12 - 1.32 mmol/L Final         Passed - Na in normal range and within 180 days    Sodium  Date Value Ref Range Status  03/26/2024 140 135 - 146 mmol/L Final  09/07/2022 143 134 - 144 mmol/L Final         Passed - Cr in normal range and within 180 days    Creat  Date Value Ref Range Status  03/26/2024 0.93 0.60 - 1.00 mg/dL Final         Passed - Cl in normal range and within 180 days    Chloride  Date Value Ref Range Status  03/26/2024 100 98 - 110 mmol/L Final         Passed - Last BP in normal range    BP Readings from Last 1 Encounters:  07/07/24 130/86         Passed - Valid encounter within last 6 months    Recent Outpatient Visits           1 month ago Dysuria   [place] [facility] Kayla Jeoffrey RAMAN, FNP   5 months ago Postmenopausal estrogen deficiency   [place] [facility] Duanne Butler DASEN, MD   5 months ago Dysuria   [place] [facility] Duanne Butler DASEN, MD   1 year ago Dysuria   [place] [facility] Pickard, Butler DASEN, MD

## 2024-09-01 ENCOUNTER — Other Ambulatory Visit: Payer: Self-pay

## 2024-09-01 NOTE — Telephone Encounter (Signed)
 Prescription Request  09/01/2024  LOV: 07/07/24  What is the name of the medication or equipment? potassium chloride  SA (KLOR-CON  M) 20 MEQ tablet [[id number]]   Have you contacted your pharmacy to request a refill? Yes   Which pharmacy would you like this sent to?  [facility] - [place] - [address] [place] [address] Phone: [phone] Fax: [phone]    Patient notified that their request is being sent to the clinical staff for review and that they should receive a response within 2 business days.   Please advise at [facility] [phone]

## 2024-09-02 NOTE — Telephone Encounter (Signed)
 Requested medication (s) are due for refill today: yes  Requested medication (s) are on the active medication list: yes  Last refill:  01/07/24  Future visit scheduled: no  Notes to clinic:  Unable to refill per protocol, last refill by another provider.      Requested Prescriptions  Pending Prescriptions Disp Refills   potassium chloride  SA (KLOR-CON  M) 20 MEQ tablet 30 tablet 0    Sig: Take 1 tablet (20 mEq total) by mouth daily. Appointment Required For Further Refills [phone]     Endocrinology:  Minerals - Potassium Supplementation Passed - 09/02/2024  4:14 PM      Passed - K in normal range and within 360 days    Potassium  Date Value Ref Range Status  03/26/2024 5.3 3.5 - 5.3 mmol/L Final         Passed - Cr in normal range and within 360 days    Creat  Date Value Ref Range Status  03/26/2024 0.93 0.60 - 1.00 mg/dL Final         Passed - Valid encounter within last 12 months    Recent Outpatient Visits           1 month ago Dysuria   [place] [facility] Kayla Jeoffrey RAMAN, FNP   5 months ago Postmenopausal estrogen deficiency   [place] [facility] Duanne, Butler DASEN, MD   6 months ago Dysuria   [place] [facility] Duanne Butler DASEN, MD   1 year ago Dysuria   [place] [facility] Pickard, Butler DASEN, MD

## 2024-09-05 ENCOUNTER — Telehealth (HOSPITAL_COMMUNITY): Payer: Self-pay | Admitting: *Deleted

## 2024-09-05 ENCOUNTER — Telehealth: Payer: Self-pay

## 2024-09-05 NOTE — Telephone Encounter (Signed)
 Copied from CRM [id number]. Topic: Clinical - Prescription Issue >> Sep 05, 2024 11:12 AM Treva T wrote: Reason for CRM: Received call from patient, requesting to speak directly to nurse of PCP provider regarding medication, Potassium. Per patient states medication was denied.  Per chart review, medication for Potsssium was denied per cardiologist, and per notes, Appointment Required For Further Refills [phone].  Patient given above information, however would still like to speak directly to PCP provider's nurse.   Patien can be reached at [phone], to discuss further.  Patient is aware of same day call back.

## 2024-09-05 NOTE — Telephone Encounter (Signed)
 Patient called in for refills of her prescriptions. Explained to patient without appropriate follow up; patient has not been in office since November 2023 and multiple No show for appts. Offered to schedule pt for appt - pt refused stating she is 78 years old and should be able to decide if she goes to the doctor or not. Educated patient on provider responsbillity to appropriately monitor the patient when refilling medications. Pt verbalized understanding and stated she will think about it and call back.

## 2024-09-09 ENCOUNTER — Other Ambulatory Visit: Payer: Self-pay | Admitting: Family Medicine

## 2024-09-09 DIAGNOSIS — I4819 Other persistent atrial fibrillation: Secondary | ICD-10-CM

## 2024-09-09 DIAGNOSIS — I10 Essential (primary) hypertension: Secondary | ICD-10-CM

## 2024-09-09 DIAGNOSIS — I4891 Unspecified atrial fibrillation: Secondary | ICD-10-CM

## 2024-09-09 DIAGNOSIS — I5021 Acute systolic (congestive) heart failure: Secondary | ICD-10-CM

## 2024-09-16 ENCOUNTER — Encounter (HOSPITAL_COMMUNITY): Payer: Self-pay | Admitting: Internal Medicine

## 2024-09-16 ENCOUNTER — Ambulatory Visit (HOSPITAL_COMMUNITY)
Admission: RE | Admit: 2024-09-16 | Discharge: 2024-09-16 | Disposition: A | Discharge status: Home / Self Care | Source: Ambulatory Visit | Attending: Internal Medicine | Admitting: Internal Medicine

## 2024-09-16 VITALS — BP 112/90 | HR 58 | Ht 62.0 in | Wt 143.8 lb

## 2024-09-16 DIAGNOSIS — D6869 Other thrombophilia: Secondary | ICD-10-CM

## 2024-09-16 DIAGNOSIS — I480 Paroxysmal atrial fibrillation: Secondary | ICD-10-CM | POA: Diagnosis not present

## 2024-09-16 DIAGNOSIS — Z5181 Encounter for therapeutic drug level monitoring: Secondary | ICD-10-CM

## 2024-09-16 MED ORDER — POTASSIUM CHLORIDE ER 10 MEQ PO TBCR: EXTENDED_RELEASE_TABLET | ORAL | 2 refills | Status: AC

## 2024-09-16 NOTE — Patient Instructions (Signed)
Stop Amiodarone today.

## 2024-09-16 NOTE — Progress Notes (Signed)
 Primary Care Physician: Duanne Butler DASEN, MD Referring Physician: Hospital F/u  Primary EP: Dr Inocencio   Kristina Dougherty is a 78 y.o. female with a h/o HTN, CHF, anxiety/depression, that was admitted 12/04/21 to 12/06/21 at [facility] with new onset afib with RVR. She was rate controlled with IV Cardizem  and then switched to metoprolol  50 mg bid.  She was also started on eliquis  5 mg bid for stroke prevention.  In the clinic on f/u, her EKG showed afib with RVR at 162 bpm. She had gained 10  lbs of fluid. She is having shortness of breath  and she stopped both eliquis  and metoprolol  as she felt the drugs were making her worse. She feels very full thru her abdomen which effects her appetite and has ++ pedal edema, does not describe PND/orthopnea. Her HCTZ was stopped in the hospital. She is here with her son today. She had a fall last week but was not on anticoagulation at the time.    F/u in the afib clinic, 01/03/22. She now has had on her scales at home, in her PJ's, around a 10 lb weight loss, since lasix  was started last visit  here with clothes, 6 lbs. Since back on metoprolol , she is now running 117-130 bpm in afib  vrs 160bpm last week when I saw her not on metoprolol . She has been taking her eliquis  on  a regular basis since last week. She feels improved but still with a lot of fatigue and shortness of breath. Still no PND/orthopnea. We discussed that she has a soft BP limiting up titration o BB. Since she has not been on anticoagulation x 3 weeks, and I am concerned she may have deterioration in her status,   will go ahead and schedule TEE/cardioversion for next Monday, which is next available. She and her son are in agreement. Still remind them to have a low threshold to go to ER if symptoms worsen.   F/u in afib clinic 03/02/22. She is feeling so much better as she is taking her lasix  correctly and her weight is down around 6 lbs. She remains in rate controlled afib and now that she has loaded on  amiodarone  for another month will give it one more try at cardioversion to see if can convert her to SR. She is being compliant with anticoagulation.  F/u 03/28/22. Pt decided that her body needed a rest and cancelled her cardioversion after I saw her last. I reviewed with her today the her echo showed 40 to 45 % EF and with prior admits for HF  and with her being loaded on amiodarone  it may be for her overall cardiac health and to prevent future  issues with heart failure if she carried out another cardioversion. She has not missed any anticoagulation. She consented to try to purse another cardioversion.She continues on amiodarone  200 mg daily. Overall she is feeling ok but tired.   F/u in afib clinic, 04/12/22.  She had a successful cardioversion. Her BB was reduced 2/2 brady ata time of cardioversion but will reduce again today with HR in the 40's form which she ius not symptomatic. She feels improved in SR. She did hit her hand with a stream of water form a pressure washer 5/12. Area observed today. No  redness nor heat. One inch abrasion on thumb area, appears to be healing.   Follow up in the AF clinic 04/26/22. Unfortunately, patient is back in rate controlled afib today. She woke with palpitations after  a nightmare on 04/23/22. Overall, she is not as symptomatic with her afib this time. She does have palpitations intermittently. No symptoms of fluid overload.   Follow up in the AF clinic 10/24/22. Patient is s/p afib ablation with Dr Inocencio on 09/28/22. She reports that she has had only one episode of afib lasting roughly 5 minutes. She does occasionally get lightheaded when moving to a standing position.   Follow up in the AF clinic 09/16/24. Patient has not been seen for the past 2 years and per review of records she remains on amiodarone  200 mg daily. She never followed up for the 3 month post ablation visit and therefore was never discontinued off amiodarone .   Today, she denies symptoms of  palpitations, chest pain, shortness of breath, orthopnea, PND, lower extremity edema, dizziness, presyncope, syncope, or neurologic sequela. The patient is tolerating medications without difficulties and is otherwise without complaint today.   Past Medical History:  Diagnosis Date   Anxiety    Arthritis    Atrial fibrillation (HCC)    Deaf, right    Depression    Hypertension    Osteopenia    Past Surgical History:  Procedure Laterality Date   ABDOMINAL HYSTERECTOMY     APPENDECTOMY     ATRIAL FIBRILLATION ABLATION N/A 09/28/2022   Procedure: ATRIAL FIBRILLATION ABLATION;  Surgeon: Inocencio Soyla Lunger, MD;  Location: [facility] INVASIVE CV LAB;  Service: Cardiovascular;  Laterality: N/A;   BUBBLE STUDY  01/09/2022   Procedure: BUBBLE STUDY;  Surgeon: Raford Riggs, MD;  Location: [facility] ENDOSCOPY;  Service: Cardiovascular;;   CARDIOVERSION N/A 01/09/2022   Procedure: CARDIOVERSION;  Surgeon: Raford Riggs, MD;  Location: [facility] ENDOSCOPY;  Service: Cardiovascular;  Laterality: N/A;   CARDIOVERSION N/A 01/20/2022   Procedure: CARDIOVERSION;  Surgeon: Jeffrie Oneil BROCKS, MD;  Location: [facility] ENDOSCOPY;  Service: Cardiovascular;  Laterality: N/A;   CARDIOVERSION N/A 04/05/2022   Procedure: CARDIOVERSION;  Surgeon: Jeffrie Oneil BROCKS, MD;  Location: [facility] ENDOSCOPY;  Service: Cardiovascular;  Laterality: N/A;   TEE WITHOUT CARDIOVERSION N/A 01/09/2022   Procedure: TRANSESOPHAGEAL ECHOCARDIOGRAM (TEE);  Surgeon: Raford Riggs, MD;  Location: [facility] ENDOSCOPY;  Service: Cardiovascular;  Laterality: N/A;   TEE WITHOUT CARDIOVERSION N/A 01/20/2022   Procedure: TRANSESOPHAGEAL ECHOCARDIOGRAM (TEE);  Surgeon: Jeffrie Oneil BROCKS, MD;  Location: [facility] ENDOSCOPY;  Service: Cardiovascular;  Laterality: N/A;    Current Outpatient Medications  Medication Sig Dispense Refill   acetaminophen  (TYLENOL ) 500 MG tablet Take 500 mg by mouth every 6 (six) hours as needed (pain.).     ALPRAZolam  (XANAX ) 0.5 MG tablet Take 1 tablet (0.5 mg  total) by mouth 3 (three) times daily as needed. for anxiety 30 tablet 2   clobetasol  cream (TEMOVATE ) 0.05 % APPLY  CREAM TOPICALLY TO AFFECTED AREA TWICE DAILY 60 g 1   ELIQUIS  5 MG TABS tablet Take 1 tablet by mouth twice daily 180 tablet 0   furosemide  (LASIX ) 40 MG tablet TAKE 1 TABLET BY MOUTH DAILY 90 tablet 1   losartan  (COZAAR ) 50 MG tablet TAKE 1 TABLET BY MOUTH ONCE  DAILY 100 tablet 2   ondansetron  (ZOFRAN ) 4 MG tablet Take 1 tablet (4 mg total) by mouth every 6 (six) hours as needed for nausea. 20 tablet 0   pantoprazole  (PROTONIX ) 40 MG tablet TAKE 1 TABLET BY MOUTH DAILY 30 tablet 0   PARoxetine  (PAXIL ) 20 MG tablet TAKE 1 TABLET BY MOUTH TWICE  DAILY 180 tablet 3   phenazopyridine  (PYRIDIUM ) 95 MG tablet Take 1 tablet (  95 mg total) by mouth 3 (three) times daily as needed for pain. 10 tablet 0   polyethylene glycol (MIRALAX  / GLYCOLAX ) 17 g packet Take 17 g by mouth daily. (Patient taking differently: Take 17 g by mouth daily as needed for moderate constipation.) 14 each 0   potassium chloride  (KLOR-CON  10) 10 MEQ tablet Take 1 tablet by mouth twice daily 180 tablet 2   potassium chloride  SA (KLOR-CON  M) 20 MEQ tablet Take 1 tablet (20 mEq total) by mouth daily. Appointment Required For Further Refills [phone] 30 tablet 0   No current facility-administered medications for this encounter.    Allergies  Allergen Reactions   Morphine  And Codeine     Severe HA   Nitrofurantoin Nausea And Vomiting    ROS- All systems are reviewed and negative except as per the HPI above  Physical Exam: Vitals:   09/16/24 1403  BP: (!) 112/90  Pulse: (!) 58  Weight: 65.2 kg  Height: 5' 2 (1.575 m)     Wt Readings from Last 3 Encounters:  09/16/24 65.2 kg  07/07/24 65.8 kg  03/28/24 67.7 kg    Labs: Lab Results  Component Value Date   NA 140 03/26/2024   K 5.3 03/26/2024   CL 100 03/26/2024   CO2 30 03/26/2024   GLUCOSE 83 03/26/2024   BUN 13 03/26/2024   CREATININE  0.93 03/26/2024   CALCIUM 9.6 03/26/2024   PHOS 3.7 01/22/2022   MG 2.1 02/09/2022   No results found for: INR Lab Results  Component Value Date   CHOL 191 03/26/2024   HDL 58 03/26/2024   LDLCALC 116 (H) 03/26/2024   TRIG 78 03/26/2024   GEN- The patient is well appearing, alert and oriented x 3 today.   Neck - no JVD or carotid bruit noted Lungs- Clear to ausculation bilaterally, normal work of breathing Heart- Regular rate and rhythm, no murmurs, rubs or gallops, PMI not laterally displaced Extremities- no clubbing, cyanosis, or edema Skin - no rash or ecchymosis noted   EKG  Vent. rate 58 BPM PR interval 170 ms QRS duration 80 ms QT/QTcB 464/455 ms P-R-T axes * -44 38 Normal sinus rhythm Left axis deviation Septal infarct (cited on or before 28-Sep-2022) Abnormal ECG When compared with ECG of 24-Oct-2022 14:51, PREVIOUS ECG IS PRESENT Reconfirmed by Terra Pac [phone]) on 09/16/2024 3:43:43 PM   Echo 01/20/22-   1. Left ventricular ejection fraction, by estimation, is 40 to 45%. The  left ventricle has mildly decreased function. The left ventricle  demonstrates global hypokinesis.   2. Right ventricular systolic function is normal. The right ventricular  size is normal.   3. Left atrial size was moderately dilated. No left atrial/left atrial  appendage thrombus was detected.   4. Right atrial size was moderately dilated.   5. A small pericardial effusion is present. The pericardial effusion is  circumferential.   6. The mitral valve is normal in structure. Mild mitral valve  regurgitation. No evidence of mitral stenosis.   7. Tricuspid valve regurgitation is moderate.   8. The aortic valve is normal in structure. Aortic valve regurgitation is  not visualized. No aortic stenosis is present.   9. The inferior vena cava is normal in size with greater than 50%  respiratory variability, suggesting right atrial pressure of 3 mmHg.  10. Evidence of atrial level  shunting detected by color flow Doppler.  There is a small patent foramen ovale with predominantly left to right  shunting  across the atrial septum.   CHA2DS2-VASc Score = 5  The patient's score is based upon: CHF History: 1 HTN History: 1 Diabetes History: 0 Stroke History: 0 Vascular Disease History: 0 Age Score: 2 Gender Score: 1       ASSESSMENT AND PLAN: Persistent Atrial Fibrillation (ICD10:  I48.19) The patient's CHA2DS2-VASc score is 5, indicating a 7.2% annual risk of stroke.   S/p afib ablation 09/28/22 by Dr. Inocencio.  Patient is currently in NSR. We discussed patient never came for her 3 month follow up post ablation. We discussed how at that visit we would have typically stopped amiodarone . She has remained on medication for the past ~two years. We will stop amiodarone  today and patient is in agreement.   Secondary Hypercoagulable State (ICD10:  D68.69) The patient is at significant risk for stroke/thromboembolism based upon her CHA2DS2-VASc Score of 5.  Continue Apixaban  (Eliquis ).  Continue Eliquis .   HTN Stable today.   Chronic systolic CHF EF 40-45% Appears euvolemic today.    Follow up 6 months Afib clinic.    Dorn Heinrich, [facility] [address] [place], [place] [address] [phone]

## 2024-10-03 ENCOUNTER — Other Ambulatory Visit: Payer: Self-pay | Admitting: Family Medicine

## 2024-10-03 NOTE — Telephone Encounter (Signed)
 Copied from CRM #[id number]. Topic: Clinical - Medication Refill >> Oct 03, 2024 10:02 AM Selinda RAMAN wrote: Medication: ELIQUIS  5 MG TABS tablet  Has the patient contacted their pharmacy? Yes   This is the patient's preferred pharmacy:   [facility] [address], [place] - [address]  Phone: [phone] Fax: [phone]   Is this the correct pharmacy for this prescription? Yes If no, delete pharmacy and type the correct one.   Has the prescription been filled recently? No  Is the patient out of the medication? No she has almost a week left  Has the patient been seen for an appointment in the last year OR does the patient have an upcoming appointment? Yes  Can we respond through MyChart? No  Please assist patient further

## 2024-10-04 MED ORDER — APIXABAN 5 MG PO TABS: 5.0000 mg | ORAL_TABLET | Freq: Two times a day (BID) | ORAL | 1 refills | Status: AC

## 2024-10-04 NOTE — Telephone Encounter (Signed)
 Requested Prescriptions  Pending Prescriptions Disp Refills   apixaban  (ELIQUIS ) 5 MG TABS tablet 180 tablet 1    Sig: Take 1 tablet (5 mg total) by mouth 2 (two) times daily.     Hematology:  Anticoagulants - apixaban  Passed - 10/04/2024  9:41 AM      Passed - PLT in normal range and within 360 days    Platelets  Date Value Ref Range Status  03/26/2024 279 140 - 400 Thousand/uL Final  09/07/2022 226 150 - 450 x10E3/uL Final         Passed - HGB in normal range and within 360 days    Hemoglobin  Date Value Ref Range Status  03/26/2024 12.9 11.7 - 15.5 g/dL Final  89/87/7976 84.9 11.1 - 15.9 g/dL Final         Passed - HCT in normal range and within 360 days    HCT  Date Value Ref Range Status  03/26/2024 39.3 35.0 - 45.0 % Final   Hematocrit  Date Value Ref Range Status  09/07/2022 45.1 34.0 - 46.6 % Final         Passed - Cr in normal range and within 360 days    Creat  Date Value Ref Range Status  03/26/2024 0.93 0.60 - 1.00 mg/dL Final         Passed - AST in normal range and within 360 days    AST  Date Value Ref Range Status  03/26/2024 20 10 - 35 U/L Final         Passed - ALT in normal range and within 360 days    ALT  Date Value Ref Range Status  03/26/2024 10 6 - 29 U/L Final         Passed - Valid encounter within last 12 months    Recent Outpatient Visits           2 months ago Dysuria   [place] [facility] Kayla Jeoffrey RAMAN, FNP   6 months ago Postmenopausal estrogen deficiency   [place] [facility] Pickard, Butler DASEN, MD   7 months ago Dysuria   [place] [facility] Duanne Butler DASEN, MD   1 year ago Dysuria   [place] [facility] Pickard, Butler DASEN, MD

## 2024-11-12 ENCOUNTER — Other Ambulatory Visit: Payer: Self-pay | Admitting: Family Medicine

## 2024-11-12 DIAGNOSIS — I10 Essential (primary) hypertension: Secondary | ICD-10-CM

## 2024-11-12 DIAGNOSIS — I4891 Unspecified atrial fibrillation: Secondary | ICD-10-CM

## 2024-11-12 DIAGNOSIS — I5021 Acute systolic (congestive) heart failure: Secondary | ICD-10-CM

## 2024-11-12 DIAGNOSIS — I4819 Other persistent atrial fibrillation: Secondary | ICD-10-CM

## 2024-12-30 ENCOUNTER — Telehealth: Payer: Self-pay

## 2024-12-30 ENCOUNTER — Other Ambulatory Visit: Payer: Self-pay | Admitting: Family Medicine

## 2025-01-01 NOTE — Telephone Encounter (Signed)
 Requested medication (s) are due for refill today: yes  Requested medication (s) are on the active medication list: yes  Last refill:  07/24/24  Future visit scheduled: no  Notes to clinic:  Unable to refill per protocol, cannot delegate.      Requested Prescriptions  Pending Prescriptions Disp Refills   ALPRAZolam  (XANAX ) 0.5 MG tablet 30 tablet 2    Sig: Take 1 tablet (0.5 mg total) by mouth 3 (three) times daily as needed. for anxiety     Not Delegated - Psychiatry: Anxiolytics/Hypnotics 2 Failed - 01/01/2025 11:31 AM      Failed - This refill cannot be delegated      Failed - Urine Drug Screen completed in last 360 days      Passed - Patient is not pregnant      Passed - Valid encounter within last 6 months    Recent Outpatient Visits           5 months ago Dysuria   [place] [facility] Kayla Jeoffrey RAMAN, FNP   9 months ago Postmenopausal estrogen deficiency   [place] [facility] Pickard, Butler DASEN, MD   10 months ago Dysuria   [place] [facility] Duanne Butler DASEN, MD   1 year ago Dysuria   [place] [facility] Pickard, Butler DASEN, MD

## 2025-01-02 MED ORDER — ALPRAZOLAM 0.5 MG PO TABS: 0.5000 mg | ORAL_TABLET | Freq: Three times a day (TID) | ORAL | 2 refills | Status: AC | PRN

## 2025-03-17 ENCOUNTER — Ambulatory Visit (HOSPITAL_COMMUNITY): Admitting: Internal Medicine
